# Patient Record
Sex: Female | Born: 1965 | Race: White | Hispanic: No | Marital: Single | Smoking: Former smoker
Health system: Southern US, Community
[De-identification: ages and names within clinical notes are randomized; demographics above are authoritative.]

## PROBLEM LIST (undated history)

## (undated) DIAGNOSIS — N943 Premenstrual tension syndrome: Secondary | ICD-10-CM

## (undated) DIAGNOSIS — B3731 Acute candidiasis of vulva and vagina: Secondary | ICD-10-CM

## (undated) DIAGNOSIS — B373 Candidiasis of vulva and vagina: Secondary | ICD-10-CM

## (undated) DIAGNOSIS — IMO0001 Reserved for inherently not codable concepts without codable children: Secondary | ICD-10-CM

## (undated) DIAGNOSIS — E7800 Pure hypercholesterolemia, unspecified: Secondary | ICD-10-CM

## (undated) DIAGNOSIS — R51 Headache: Secondary | ICD-10-CM

## (undated) DIAGNOSIS — I499 Cardiac arrhythmia, unspecified: Secondary | ICD-10-CM

## (undated) DIAGNOSIS — F319 Bipolar disorder, unspecified: Secondary | ICD-10-CM

## (undated) DIAGNOSIS — N951 Menopausal and female climacteric states: Secondary | ICD-10-CM

## (undated) DIAGNOSIS — F79 Unspecified intellectual disabilities: Secondary | ICD-10-CM

## (undated) DIAGNOSIS — N858 Other specified noninflammatory disorders of uterus: Secondary | ICD-10-CM

## (undated) DIAGNOSIS — F419 Anxiety disorder, unspecified: Secondary | ICD-10-CM

## (undated) DIAGNOSIS — R87619 Unspecified abnormal cytological findings in specimens from cervix uteri: Secondary | ICD-10-CM

## (undated) DIAGNOSIS — Z8639 Personal history of other endocrine, nutritional and metabolic disease: Secondary | ICD-10-CM

## (undated) DIAGNOSIS — N920 Excessive and frequent menstruation with regular cycle: Secondary | ICD-10-CM

## (undated) DIAGNOSIS — G2581 Restless legs syndrome: Secondary | ICD-10-CM

## (undated) DIAGNOSIS — R300 Dysuria: Secondary | ICD-10-CM

## (undated) DIAGNOSIS — N946 Dysmenorrhea, unspecified: Secondary | ICD-10-CM

## (undated) DIAGNOSIS — Z87828 Personal history of other (healed) physical injury and trauma: Secondary | ICD-10-CM

## (undated) DIAGNOSIS — R569 Unspecified convulsions: Secondary | ICD-10-CM

## (undated) DIAGNOSIS — B009 Herpesviral infection, unspecified: Secondary | ICD-10-CM

## (undated) DIAGNOSIS — IMO0002 Reserved for concepts with insufficient information to code with codable children: Secondary | ICD-10-CM

## (undated) DIAGNOSIS — N838 Other noninflammatory disorders of ovary, fallopian tube and broad ligament: Secondary | ICD-10-CM

## (undated) DIAGNOSIS — F70 Mild intellectual disabilities: Secondary | ICD-10-CM

## (undated) DIAGNOSIS — Z8742 Personal history of other diseases of the female genital tract: Secondary | ICD-10-CM

## (undated) DIAGNOSIS — Z8669 Personal history of other diseases of the nervous system and sense organs: Secondary | ICD-10-CM

## (undated) HISTORY — PX: DILATION AND CURETTAGE OF UTERUS: SHX78

## (undated) HISTORY — DX: Pure hypercholesterolemia, unspecified: E78.00

## (undated) HISTORY — DX: Dysmenorrhea, unspecified: N94.6

## (undated) HISTORY — DX: Personal history of other endocrine, nutritional and metabolic disease: Z86.39

## (undated) HISTORY — DX: Acute candidiasis of vulva and vagina: B37.31

## (undated) HISTORY — DX: Herpesviral infection, unspecified: B00.9

## (undated) HISTORY — DX: Unspecified convulsions: R56.9

## (undated) HISTORY — DX: Mild intellectual disabilities: F70

## (undated) HISTORY — DX: Headache: R51

## (undated) HISTORY — DX: Bipolar disorder, unspecified: F31.9

## (undated) HISTORY — DX: Personal history of other diseases of the nervous system and sense organs: Z86.69

## (undated) HISTORY — DX: Premenstrual tension syndrome: N94.3

## (undated) HISTORY — PX: WISDOM TOOTH EXTRACTION: SHX21

## (undated) HISTORY — DX: Restless legs syndrome: G25.81

## (undated) HISTORY — DX: Anxiety disorder, unspecified: F41.9

## (undated) HISTORY — PX: HYSTEROSCOPY W/ ENDOMETRIAL ABLATION: SUR665

## (undated) HISTORY — DX: Unspecified abnormal cytological findings in specimens from cervix uteri: R87.619

## (undated) HISTORY — DX: Dysuria: R30.0

## (undated) HISTORY — DX: Reserved for inherently not codable concepts without codable children: IMO0001

## (undated) HISTORY — DX: Other specified noninflammatory disorders of uterus: N85.8

## (undated) HISTORY — PX: ENDOMETRIAL BIOPSY: SHX622

## (undated) HISTORY — DX: Reserved for concepts with insufficient information to code with codable children: IMO0002

## (undated) HISTORY — DX: Other noninflammatory disorders of ovary, fallopian tube and broad ligament: N83.8

## (undated) HISTORY — DX: Personal history of other diseases of the female genital tract: Z87.42

## (undated) HISTORY — PX: COLPOSCOPY: SHX161

## (undated) HISTORY — PX: POLYPECTOMY: SHX149

## (undated) HISTORY — DX: Candidiasis of vulva and vagina: B37.3

## (undated) HISTORY — DX: Menopausal and female climacteric states: N95.1

## (undated) HISTORY — DX: Excessive and frequent menstruation with regular cycle: N92.0

---

## 1998-12-26 ENCOUNTER — Observation Stay (HOSPITAL_COMMUNITY): Admission: EM | Admit: 1998-12-26 | Discharge: 1998-12-27 | Payer: Self-pay | Admitting: Emergency Medicine

## 1999-01-13 ENCOUNTER — Other Ambulatory Visit: Admission: RE | Admit: 1999-01-13 | Discharge: 1999-01-13 | Payer: Self-pay | Admitting: *Deleted

## 1999-03-19 ENCOUNTER — Ambulatory Visit (HOSPITAL_COMMUNITY): Admission: RE | Admit: 1999-03-19 | Discharge: 1999-03-19 | Payer: Self-pay | Admitting: *Deleted

## 1999-11-04 ENCOUNTER — Other Ambulatory Visit: Admission: RE | Admit: 1999-11-04 | Discharge: 1999-11-04 | Payer: Self-pay | Admitting: *Deleted

## 2001-06-13 ENCOUNTER — Emergency Department (HOSPITAL_COMMUNITY): Admission: EM | Admit: 2001-06-13 | Discharge: 2001-06-13 | Payer: Self-pay | Admitting: Emergency Medicine

## 2001-09-05 ENCOUNTER — Inpatient Hospital Stay (HOSPITAL_COMMUNITY): Admission: AD | Admit: 2001-09-05 | Discharge: 2001-09-05 | Payer: Self-pay | Admitting: Obstetrics

## 2001-09-20 ENCOUNTER — Inpatient Hospital Stay (HOSPITAL_COMMUNITY): Admission: AD | Admit: 2001-09-20 | Discharge: 2001-09-20 | Payer: Self-pay | Admitting: *Deleted

## 2001-09-21 ENCOUNTER — Inpatient Hospital Stay (HOSPITAL_COMMUNITY): Admission: AD | Admit: 2001-09-21 | Discharge: 2001-09-21 | Payer: Self-pay | Admitting: *Deleted

## 2002-09-12 ENCOUNTER — Other Ambulatory Visit: Admission: RE | Admit: 2002-09-12 | Discharge: 2002-09-12 | Payer: Self-pay | Admitting: Obstetrics and Gynecology

## 2005-10-19 DIAGNOSIS — B373 Candidiasis of vulva and vagina: Secondary | ICD-10-CM

## 2005-10-19 DIAGNOSIS — B3731 Acute candidiasis of vulva and vagina: Secondary | ICD-10-CM

## 2005-10-19 HISTORY — DX: Candidiasis of vulva and vagina: B37.3

## 2005-10-19 HISTORY — DX: Acute candidiasis of vulva and vagina: B37.31

## 2006-07-19 ENCOUNTER — Emergency Department (HOSPITAL_COMMUNITY): Admission: EM | Admit: 2006-07-19 | Discharge: 2006-07-19 | Payer: Self-pay | Admitting: Family Medicine

## 2006-08-03 ENCOUNTER — Other Ambulatory Visit: Admission: RE | Admit: 2006-08-03 | Discharge: 2006-08-03 | Payer: Self-pay | Admitting: Obstetrics and Gynecology

## 2006-09-27 ENCOUNTER — Emergency Department (HOSPITAL_COMMUNITY): Admission: EM | Admit: 2006-09-27 | Discharge: 2006-09-27 | Payer: Self-pay | Admitting: Emergency Medicine

## 2006-11-16 ENCOUNTER — Emergency Department (HOSPITAL_COMMUNITY): Admission: EM | Admit: 2006-11-16 | Discharge: 2006-11-16 | Payer: Self-pay | Admitting: Family Medicine

## 2007-04-19 ENCOUNTER — Encounter: Admission: RE | Admit: 2007-04-19 | Discharge: 2007-04-19 | Payer: Self-pay | Admitting: Family Medicine

## 2008-08-13 ENCOUNTER — Emergency Department (HOSPITAL_COMMUNITY): Admission: EM | Admit: 2008-08-13 | Discharge: 2008-08-13 | Payer: Self-pay | Admitting: Emergency Medicine

## 2010-01-30 ENCOUNTER — Ambulatory Visit (HOSPITAL_COMMUNITY): Admission: RE | Admit: 2010-01-30 | Discharge: 2010-01-30 | Payer: Self-pay | Admitting: Obstetrics and Gynecology

## 2010-07-21 ENCOUNTER — Ambulatory Visit (HOSPITAL_COMMUNITY): Admission: RE | Admit: 2010-07-21 | Discharge: 2010-07-21 | Payer: Self-pay | Admitting: Obstetrics and Gynecology

## 2010-12-16 ENCOUNTER — Other Ambulatory Visit: Payer: Self-pay | Admitting: Obstetrics and Gynecology

## 2010-12-16 DIAGNOSIS — Z1231 Encounter for screening mammogram for malignant neoplasm of breast: Secondary | ICD-10-CM

## 2010-12-31 LAB — HCG, QUANTITATIVE, PREGNANCY: hCG, Beta Chain, Quant, S: <2 m[IU]/mL (ref <5)

## 2011-01-01 LAB — CBC
HCT: 37.3 % (ref 36.0–46.0)
Hemoglobin: 12.8 g/dL (ref 12.0–15.0)
MCH: 31.3 pg (ref 26.0–34.0)
MCHC: 34.4 g/dL (ref 30.0–36.0)
MCV: 91.1 fL (ref 78.0–100.0)
Platelets: 294 10*3/uL (ref 150–400)
RBC: 4.09 MIL/uL (ref 3.87–5.11)
RDW: 13 % (ref 11.5–15.5)
WBC: 5.5 10*3/uL (ref 4.0–10.5)

## 2011-01-02 ENCOUNTER — Ambulatory Visit: Payer: Self-pay

## 2011-01-07 LAB — CBC
HCT: 37.6 % (ref 36.0–46.0)
Hemoglobin: 12.9 g/dL (ref 12.0–15.0)
MCHC: 34.4 g/dL (ref 30.0–36.0)
MCV: 91.4 fL (ref 78.0–100.0)
Platelets: 317 10*3/uL (ref 150–400)
RBC: 4.11 MIL/uL (ref 3.87–5.11)
RDW: 13.2 % (ref 11.5–15.5)
WBC: 5.8 10*3/uL (ref 4.0–10.5)

## 2011-01-14 ENCOUNTER — Ambulatory Visit: Payer: Self-pay

## 2011-02-04 ENCOUNTER — Ambulatory Visit
Admission: RE | Admit: 2011-02-04 | Discharge: 2011-02-04 | Disposition: A | Discharge status: Home / Self Care | Payer: Medicare Other | Source: Ambulatory Visit | Attending: Obstetrics and Gynecology | Admitting: Obstetrics and Gynecology

## 2011-02-04 DIAGNOSIS — Z1231 Encounter for screening mammogram for malignant neoplasm of breast: Secondary | ICD-10-CM

## 2011-05-01 DIAGNOSIS — Z5181 Encounter for therapeutic drug level monitoring: Secondary | ICD-10-CM | POA: Insufficient documentation

## 2011-07-20 LAB — URINE MICROSCOPIC-ADD ON

## 2011-07-20 LAB — DIFFERENTIAL
Lymphocytes Relative: 23
Lymphs Abs: 2
Monocytes Absolute: 0.5
Monocytes Relative: 6
Neutro Abs: 5.9

## 2011-07-20 LAB — RAPID URINE DRUG SCREEN, HOSP PERFORMED
Amphetamines: NOT DETECTED
Barbiturates: POSITIVE — AB

## 2011-07-20 LAB — COMPREHENSIVE METABOLIC PANEL
AST: 19
CO2: 28
Calcium: 9.2
Creatinine, Ser: 0.55
GFR calc Af Amer: >60
GFR calc non Af Amer: >60
Glucose, Bld: 87

## 2011-07-20 LAB — CBC
Hemoglobin: 13
RBC: 4.17

## 2011-07-20 LAB — URINALYSIS, ROUTINE W REFLEX MICROSCOPIC
Glucose, UA: NEGATIVE
Specific Gravity, Urine: 1.011
Urobilinogen, UA: 0.2

## 2011-07-20 LAB — POCT PREGNANCY, URINE: Preg Test, Ur: NEGATIVE

## 2011-10-20 HISTORY — PX: ABDOMINAL HYSTERECTOMY: SHX81

## 2012-02-16 ENCOUNTER — Other Ambulatory Visit: Payer: Self-pay | Admitting: Family Medicine

## 2012-02-16 DIAGNOSIS — Z1231 Encounter for screening mammogram for malignant neoplasm of breast: Secondary | ICD-10-CM

## 2012-02-26 ENCOUNTER — Ambulatory Visit
Admission: RE | Admit: 2012-02-26 | Discharge: 2012-02-26 | Disposition: A | Discharge status: Home / Self Care | Payer: Medicare Other | Source: Ambulatory Visit | Attending: Family Medicine | Admitting: Family Medicine

## 2012-02-26 DIAGNOSIS — Z1231 Encounter for screening mammogram for malignant neoplasm of breast: Secondary | ICD-10-CM

## 2012-03-22 ENCOUNTER — Encounter: Payer: Self-pay | Admitting: Obstetrics and Gynecology

## 2012-04-20 ENCOUNTER — Encounter: Payer: Self-pay | Admitting: Obstetrics and Gynecology

## 2012-04-20 DIAGNOSIS — F319 Bipolar disorder, unspecified: Secondary | ICD-10-CM | POA: Insufficient documentation

## 2012-04-20 DIAGNOSIS — Z8669 Personal history of other diseases of the nervous system and sense organs: Secondary | ICD-10-CM | POA: Insufficient documentation

## 2012-04-20 DIAGNOSIS — Z8639 Personal history of other endocrine, nutritional and metabolic disease: Secondary | ICD-10-CM | POA: Insufficient documentation

## 2012-04-20 DIAGNOSIS — B379 Candidiasis, unspecified: Secondary | ICD-10-CM

## 2012-04-20 DIAGNOSIS — IMO0001 Reserved for inherently not codable concepts without codable children: Secondary | ICD-10-CM

## 2012-04-20 DIAGNOSIS — G40909 Epilepsy, unspecified, not intractable, without status epilepticus: Secondary | ICD-10-CM | POA: Insufficient documentation

## 2012-04-20 DIAGNOSIS — Z8742 Personal history of other diseases of the female genital tract: Secondary | ICD-10-CM | POA: Insufficient documentation

## 2012-04-20 DIAGNOSIS — L819 Disorder of pigmentation, unspecified: Secondary | ICD-10-CM | POA: Insufficient documentation

## 2012-04-20 DIAGNOSIS — B009 Herpesviral infection, unspecified: Secondary | ICD-10-CM

## 2012-04-20 DIAGNOSIS — N943 Premenstrual tension syndrome: Secondary | ICD-10-CM

## 2012-04-22 ENCOUNTER — Encounter: Payer: Self-pay | Admitting: Obstetrics and Gynecology

## 2012-04-25 ENCOUNTER — Encounter: Payer: Self-pay | Admitting: Obstetrics and Gynecology

## 2012-06-10 ENCOUNTER — Telehealth: Payer: Self-pay

## 2012-06-10 NOTE — Telephone Encounter (Signed)
Lm on vm tcb rgd msg 

## 2012-07-20 ENCOUNTER — Encounter: Payer: Self-pay | Admitting: Obstetrics and Gynecology

## 2012-08-22 ENCOUNTER — Ambulatory Visit: Payer: Self-pay | Admitting: Obstetrics and Gynecology

## 2012-08-22 ENCOUNTER — Ambulatory Visit (INDEPENDENT_AMBULATORY_CARE_PROVIDER_SITE_OTHER): Payer: Medicare Other | Admitting: Obstetrics and Gynecology

## 2012-08-22 ENCOUNTER — Encounter: Payer: Self-pay | Admitting: Obstetrics and Gynecology

## 2012-08-22 VITALS — BP 116/74 | Ht 65.0 in | Wt 200.0 lb

## 2012-08-22 DIAGNOSIS — Z124 Encounter for screening for malignant neoplasm of cervix: Secondary | ICD-10-CM

## 2012-08-22 NOTE — Patient Instructions (Signed)

## 2012-08-22 NOTE — Progress Notes (Signed)
Last Pap: 2012 WNL: ascus Regular Periods:yes Contraception: n/a  Monthly Breast exam:yes Tetanus<67yrs:yes Nl.Bladder Function:yes Daily BMs:yes Healthy Diet:yes Calcium:no Mammogram:yes Date of Mammogram: 2013 dense breast need repeat Exercise:no Have often Exercise: n/a Seatbelt: yes Abuse at home: no Stressful work:no Sigmoid-colonoscopy: n/a Bone Density: No PCP: Dr. Clarene Duke Change in PMH: no change Change in FMH:no change BP 116/74  Ht 5\' 5"  (1.651 m)  Wt 200 lb (90.719 kg)  BMI 33.28 kg/m2 Pt with complaints:yes  She has had occ rapid heart beat and nausea Physical Examination: General appearance - alert, well appearing, and in no distress Mental status - normal mood, behavior, speech, dress, motor activity, and thought processes Neck - supple, no significant adenopathy,  thyroid exam: thyroid is normal in size without nodules or tenderness Chest - clear to auscultation, no wheezes, rales or rhonchi, symmetric air entry Heart - normal rate and regular rhythm Abdomen - soft, nontender, nondistended, no masses or organomegaly Breasts - breasts appear normal, no suspicious masses, no skin or nipple changes or axillary nodes Pelvic - normal external genitalia, vulva, vagina, cervix, uterus and adnexa Rectal - rectal exam not indicated Back exam - full range of motion, no tenderness, palpable spasm or pain on motion Neurological - alert, oriented, normal speech, no focal findings or movement disorder noted Musculoskeletal - no joint tenderness, deformity or swelling Extremities - no edema, redness or tenderness in the calves or thighs Skin - normal coloration and turgor, no rashes, no suspicious skin lesions noted Routine exam Pap sent yes Mammogram due no abstinance used for contraception RT 1 yr Call pcp about nausea and heart palpitaitons

## 2012-08-23 LAB — PAP IG W/ RFLX HPV ASCU

## 2012-08-29 ENCOUNTER — Telehealth: Payer: Self-pay

## 2012-08-29 NOTE — Telephone Encounter (Signed)
Spoke with pt informed pap results informed repeat in 1 yr pt voice understanding

## 2012-08-29 NOTE — Telephone Encounter (Signed)
Message copied by Rolla Plate on Mon Aug 29, 2012 10:53 AM ------      Message from: Jaymes Graff      Created: Mon Aug 29, 2012 10:05 AM       Please tell patient her pap results and that she can repeat her pap in one year.  Thank you

## 2012-10-06 ENCOUNTER — Emergency Department (HOSPITAL_COMMUNITY): Payer: Medicare Other

## 2012-10-06 ENCOUNTER — Encounter (HOSPITAL_COMMUNITY): Payer: Self-pay | Admitting: Emergency Medicine

## 2012-10-06 ENCOUNTER — Emergency Department (HOSPITAL_COMMUNITY)
Admission: EM | Admit: 2012-10-06 | Discharge: 2012-10-06 | Disposition: A | Discharge status: Home / Self Care | Payer: Medicare Other | Attending: Emergency Medicine | Admitting: Emergency Medicine

## 2012-10-06 DIAGNOSIS — F319 Bipolar disorder, unspecified: Secondary | ICD-10-CM | POA: Insufficient documentation

## 2012-10-06 DIAGNOSIS — Z79899 Other long term (current) drug therapy: Secondary | ICD-10-CM | POA: Insufficient documentation

## 2012-10-06 DIAGNOSIS — Z8719 Personal history of other diseases of the digestive system: Secondary | ICD-10-CM | POA: Insufficient documentation

## 2012-10-06 DIAGNOSIS — F411 Generalized anxiety disorder: Secondary | ICD-10-CM | POA: Insufficient documentation

## 2012-10-06 DIAGNOSIS — S82409A Unspecified fracture of shaft of unspecified fibula, initial encounter for closed fracture: Secondary | ICD-10-CM | POA: Insufficient documentation

## 2012-10-06 DIAGNOSIS — Y9389 Activity, other specified: Secondary | ICD-10-CM | POA: Insufficient documentation

## 2012-10-06 DIAGNOSIS — S82839A Other fracture of upper and lower end of unspecified fibula, initial encounter for closed fracture: Secondary | ICD-10-CM

## 2012-10-06 DIAGNOSIS — Y929 Unspecified place or not applicable: Secondary | ICD-10-CM | POA: Insufficient documentation

## 2012-10-06 DIAGNOSIS — X500XXA Overexertion from strenuous movement or load, initial encounter: Secondary | ICD-10-CM | POA: Insufficient documentation

## 2012-10-06 DIAGNOSIS — Z8669 Personal history of other diseases of the nervous system and sense organs: Secondary | ICD-10-CM | POA: Insufficient documentation

## 2012-10-06 DIAGNOSIS — E7800 Pure hypercholesterolemia, unspecified: Secondary | ICD-10-CM | POA: Insufficient documentation

## 2012-10-06 DIAGNOSIS — Z8742 Personal history of other diseases of the female genital tract: Secondary | ICD-10-CM | POA: Insufficient documentation

## 2012-10-06 NOTE — ED Notes (Signed)
Paged ortho for cam walker; states that they are on their way.

## 2012-10-06 NOTE — ED Notes (Signed)
Patient given copy of discharge paperwork; went over discharge instructions with patient.  Patient instructed to follow up with [facility] orthopedics (call first thing in the morning), to keep cam walker in place until instructed further by orthopedist, and to return to the ED for new, worsening, or concerning symptoms.  Ortho tech at bedside applying cam walker at this time.

## 2012-10-06 NOTE — ED Notes (Signed)
Patient complaining of left ankle pain; patient reports that she was taking out the trash earlier this afternoon when she slipped, twisted her left ankle, and fell down.  Patient reports that she heard a "pop" upon twisting her ankle.  Patient able to wiggle toes; sensation intact.  Ice pack placed on left ankle; Dr. Jeraldine Loots currently at bedside.  Patient alert and oriented x4; PERRL present.  Will continue to monitor.

## 2012-10-06 NOTE — ED Provider Notes (Signed)
History  Scribed for Gerhard Munch, MD, the patient was seen in room TR05C/TR05C. This chart was scribed by Candelaria Stagers. The patient's care started at 5:56 PM   CSN: [id number]  Arrival date & time 10/06/12  1742   First MD Initiated Contact with Patient 10/06/12 1753      Chief Complaint  Patient presents with  . Ankle Pain     The history is provided by the patient. No language interpreter was used.   Teresa Lawrence is a 46 y.o. female who presents to the Emergency Department complaining of left ankle pain after slipping and twisting her ankle earlier today.  Pt reports she heard a pop when she twisted the ankle.  Pt has taken nothing for pain.  Pt has h/o bipolar.  She has not been ambulatory since the fall.  Pain is worse with motion. Past Medical History  Diagnosis Date  . Vaginal yeast infection 2007    recurrent   . PMS (premenstrual syndrome)   . Dysmenorrhea   . Yeast vaginitis   . Dysuria   . High blood cholesterol   . Perimenopausal vasomotor symptoms   . HSV-2 infection   . Anxiety   . Bipolar disorder   . Menorrhagia   . Mass of ovary     left side  . Uterine mass   . Bipolar disorder   . Hx of ovarian cyst   . Hx of seizure disorder   . Dysplasia   . Abnormal Pap smear   . Hx of hypercholesterolemia   . PMS (premenstrual syndrome)   . PMS (premenstrual syndrome)     Past Surgical History  Procedure Date  . Hysteroscopy w/ endometrial ablation   . Dilation and curettage of uterus   . Polypectomy   . Colposcopy   . Endometrial biopsy   . Wisdom tooth extraction     History reviewed. No pertinent family history.  History  Substance Use Topics  . Smoking status: Never Smoker   . Smokeless tobacco: Not on file  . Alcohol Use: Not on file    OB History    Grav Para Term Preterm Abortions TAB SAB Ect Mult Living                  Review of Systems  Musculoskeletal: Positive for arthralgias (left ankle ).  All other systems reviewed  and are negative.    Allergies  Review of patient's allergies indicates no known allergies.  Home Medications   Current Outpatient Rx  Name  Route  Sig  Dispense  Refill  . CLONAZEPAM 2 MG PO TABS   Oral   Take 2 mg by mouth 2 (two) times daily as needed.         Marland Kitchen DOXEPIN HCL 75 MG PO CAPS   Oral   Take 75 mg by mouth.         Marland Kitchen FEXOFENADINE HCL 180 MG PO TABS   Oral   Take 180 mg by mouth daily.         Marland Kitchen PHENOBARBITAL 64.8 MG PO TABS   Oral   Take 64.8 mg by mouth 2 (two) times daily.         . QUETIAPINE FUMARATE 100 MG PO TABS   Oral   Take 100 mg by mouth at bedtime.         Marland Kitchen ROPINIROLE HCL 1 MG PO TABS   Oral   Take 1 mg by mouth 3 (three) times  daily.         Marland Kitchen SIMVASTATIN 40 MG PO TABS   Oral   Take 40 mg by mouth every evening.           BP 132/71  Pulse 90  Temp 97.9 F (36.6 C) (Oral)  Resp 18  SpO2 95%  Physical Exam  Nursing note and vitals reviewed. Constitutional: She is oriented to person, place, and time. She appears well-developed and well-nourished. No distress.  HENT:  Head: Normocephalic and atraumatic.  Eyes: EOM are normal.  Neck: Neck supple. No tracheal deviation present.  Musculoskeletal: Normal range of motion.       Lateral malleolus pain and edema.  Quadriceps tendon intact.    Neurological: She is alert and oriented to person, place, and time.  Skin: Skin is warm and dry.  Psychiatric: She has a normal mood and affect. Her behavior is normal.    ED Course  ORTHOPEDIC INJURY TREATMENT Date/Time: 10/06/2012 7:00 PM Performed by: Gerhard Munch Authorized by: Gerhard Munch Consent: Verbal consent obtained. The procedure was performed in an emergent situation. Risks and benefits: risks, benefits and alternatives were discussed Consent given by: patient and parent Patient identity confirmed: verbally with patient Time out: Immediately prior to procedure a "time out" was called to verify the correct  patient, procedure, equipment, support staff and site/side marked as required. Injury location: ankle Location details: left ankle Injury type: Avulsion fracture. Pre-procedure neurovascular assessment: neurovascularly intact Pre-procedure distal perfusion: normal Pre-procedure neurological function: normal Pre-procedure range of motion: reduced Local anesthesia used: no Patient sedated: no Splint type: short leg Supplies used: Cam Walker. Post-procedure neurovascular assessment: post-procedure neurovascularly intact Post-procedure distal perfusion: normal Post-procedure neurological function: normal Post-procedure range of motion: unchanged Patient tolerance: Patient tolerated the procedure well with no immediate complications.     DIAGNOSTIC STUDIES: Oxygen Saturation is 95% on room air, normal by my interpretation.    COORDINATION OF CARE:  17:55 Ordered: DG Ankle Complete Left   6:49 PM Recheck: Discussed images with pt and the need for a walking boot.  Pt understands and agrees.    Labs Reviewed - No data to display Dg Ankle Complete Left  10/06/2012  *RADIOLOGY REPORT*  Clinical Data: Fall, lateral ankle pain  LEFT ANKLE COMPLETE - 3+ VIEW  Comparison: None.  Findings: No displaced fracture, malalignment or ankle joint effusion.  There is diffuse soft tissue swelling about the lateral malleolus.  A thin linear density just inferior to the distal fibula may represent a tiny avulsion fracture.  Ankle mortise remains congruent.  Bony mineralization within normal limits.  IMPRESSION:  Probable tiny avulsion fracture at the distal tip of the fibula with associated surrounding soft tissue swelling.   Original Report Authenticated By: Malachy Moan, M.D.      No diagnosis found.   I interpreted the x-ray - distal fibula avulsion MDM   I personally performed the services described in this documentation, which was scribed in my presence. The recorded information has been  reviewed and is accurate.   This patient presents immediately after fall, with resultant pain in the left ankle.  Patient has no other focal complaints.  She is distally neurovascularly intact.  The patient's evaluation demonstrates fracture, avulsion of the distal fibula.  The patient was placed in a cam walker, stable for discharge.    Gerhard Munch, MD 10/06/12 1901

## 2012-10-06 NOTE — ED Notes (Signed)
Dr.Lockwood at bedside  

## 2012-10-06 NOTE — ED Notes (Addendum)
Pt c/o left ankle pain after falling and twisting ankle

## 2012-10-06 NOTE — Progress Notes (Signed)
Orthopedic Tech Progress Note Patient Details:  Teresa Lawrence [facility] 1966-01-19 [id number]  Ortho Devices Type of Ortho Device: CAM walker Ortho Device/Splint Location: (L)LE Ortho Device/Splint Interventions: Application   Jennye Moccasin 10/06/2012, 7:13 PM

## 2012-12-22 ENCOUNTER — Telehealth: Payer: Self-pay | Admitting: Obstetrics and Gynecology

## 2012-12-22 NOTE — Telephone Encounter (Signed)
Pt called, states beginning yesterday has been having a lot of cramping, pt says "something just does not feel right", but is not having any bldg.  It is difficult for her to get up.  The cramping is constant, is taking Pamprin with some relief.  The pain is making her very irritable and her bottom very sore.  Pt scheduled an appt for an eval tomorrow 12/23/12 w/ EP @ 1030.

## 2012-12-23 ENCOUNTER — Encounter: Payer: Medicare Other | Admitting: Obstetrics and Gynecology

## 2013-01-23 ENCOUNTER — Other Ambulatory Visit: Payer: Self-pay

## 2013-01-23 DIAGNOSIS — Z1231 Encounter for screening mammogram for malignant neoplasm of breast: Secondary | ICD-10-CM

## 2013-02-02 ENCOUNTER — Telehealth: Payer: Self-pay | Admitting: *Deleted

## 2013-02-02 NOTE — Telephone Encounter (Signed)
Patient called stating she isn't sleeping well and would like a prescription for  Xanax called into her pharmacy.

## 2013-02-02 NOTE — Telephone Encounter (Signed)
I called patient. The patient wants a prescription for Xanax, but she is already on clonazepam. The patient also has not been seen through our office in over 2 years. The patient will need a revisit.

## 2013-02-23 ENCOUNTER — Telehealth: Payer: Self-pay | Admitting: *Deleted

## 2013-02-23 ENCOUNTER — Encounter: Payer: Self-pay | Admitting: Nurse Practitioner

## 2013-02-23 ENCOUNTER — Encounter: Payer: Medicare Other | Admitting: Nurse Practitioner

## 2013-02-23 VITALS — BP 114/72 | HR 83 | Ht 63.5 in | Wt 196.0 lb

## 2013-02-23 NOTE — Progress Notes (Signed)
This encounter was created in error - please disregard.  This encounter was created in error - please disregard.

## 2013-02-23 NOTE — Telephone Encounter (Signed)
I called and spoke with the patient concerning her being upset that she couldn't see Dr. Anne Hahn. Patient stated she was told two weeks ago that she could see Dr. Anne Hahn at 9:30 and she was upset and left the exam room because she wasn't seeing Dr. Anne Hahn. I informed the patient that she did receive a reminder call on 02-22-13 to CM, patient stated she wasn't told she was seeing CM. Patient apologized for leaving the exam room.

## 2013-02-23 NOTE — Progress Notes (Signed)
This encounter was created in error - please disregard.

## 2013-02-25 ENCOUNTER — Other Ambulatory Visit: Payer: Self-pay | Admitting: Neurology

## 2013-02-27 ENCOUNTER — Telehealth: Payer: Self-pay | Admitting: *Deleted

## 2013-02-28 ENCOUNTER — Other Ambulatory Visit: Payer: Self-pay

## 2013-02-28 ENCOUNTER — Ambulatory Visit
Admission: RE | Admit: 2013-02-28 | Discharge: 2013-02-28 | Disposition: A | Discharge status: Home / Self Care | Payer: Medicare Other | Source: Ambulatory Visit

## 2013-02-28 DIAGNOSIS — Z1231 Encounter for screening mammogram for malignant neoplasm of breast: Secondary | ICD-10-CM

## 2013-02-28 MED ORDER — PHENOBARBITAL 64.8 MG PO TABS: 64.8000 mg | ORAL_TABLET | Freq: Two times a day (BID) | ORAL | Status: DC

## 2013-02-28 NOTE — Telephone Encounter (Signed)
I called and spoke with the patient. Patient is scheuduled 03-17-13@12  with Dr. Anne Hahn

## 2013-02-28 NOTE — Telephone Encounter (Signed)
Patient has appt scheduled 05/30.  She needs one refill to last until appt.  She has not been seen since 2012 and came to her last appt scheduled, but walked out before she could be seen. She said she was sorry, she was in a bad mood that day.

## 2013-03-17 ENCOUNTER — Encounter: Payer: Self-pay | Admitting: *Deleted

## 2013-03-17 ENCOUNTER — Encounter: Payer: Self-pay | Admitting: Neurology

## 2013-03-17 ENCOUNTER — Ambulatory Visit (INDEPENDENT_AMBULATORY_CARE_PROVIDER_SITE_OTHER): Payer: Medicare Other | Admitting: Neurology

## 2013-03-17 VITALS — BP 130/90 | HR 83 | Ht 64.0 in | Wt 198.0 lb

## 2013-03-17 DIAGNOSIS — G4700 Insomnia, unspecified: Secondary | ICD-10-CM

## 2013-03-17 DIAGNOSIS — F319 Bipolar disorder, unspecified: Secondary | ICD-10-CM

## 2013-03-17 DIAGNOSIS — Z5181 Encounter for therapeutic drug level monitoring: Secondary | ICD-10-CM

## 2013-03-17 DIAGNOSIS — R51 Headache: Secondary | ICD-10-CM

## 2013-03-17 DIAGNOSIS — Z8669 Personal history of other diseases of the nervous system and sense organs: Secondary | ICD-10-CM

## 2013-03-17 MED ORDER — QUETIAPINE FUMARATE 100 MG PO TABS: 150.0000 mg | ORAL_TABLET | Freq: Every day | ORAL | Status: DC

## 2013-03-17 MED ORDER — PRAMIPEXOLE DIHYDROCHLORIDE 0.25 MG PO TABS: 0.2500 mg | ORAL_TABLET | Freq: Three times a day (TID) | ORAL | Status: DC

## 2013-03-17 NOTE — Progress Notes (Signed)
Reason for visit: Headache  Teresa Lawrence is an 47 y.o. female  History of present illness:  Teresa Lawrence is a 47 year old right-handed white female with a history of a bipolar disorder, mild mental retardation, well controlled seizure disorder, migraine headache, and a panic disorder. The patient indicates that she continues to have chronic insomnia, and she is having some problems with anxiety episodes at night and during the day. The patient is having panic attacks almost daily. The patient indicates that she is now having increasing problems with achy sensations in the legs when she sits and rests. The legs do not bother her when she is up doing things. In the past, Mirapex seemed to help in low dose taking 0.125 mg 3 times daily. The effects of this medication have since worn off. The patient indicates that her mood swings are getting worse. The patient does not see a psychiatrist for this. The patient takes Seroquel at night, which helps her mood disorder to some degree. The patient also reports relatively frequent "ice pick pains" throughout her head. The patient returns for an evaluation. The patient has not had any further seizures since being on phenobarbital.  Past Medical History  Diagnosis Date  . Vaginal yeast infection 2007    recurrent   . PMS (premenstrual syndrome)   . Dysmenorrhea   . Yeast vaginitis   . Dysuria   . High blood cholesterol   . Perimenopausal vasomotor symptoms   . HSV-2 infection   . Anxiety   . Bipolar disorder   . Menorrhagia   . Mass of ovary     left side  . Uterine mass   . Hx of ovarian cyst   . Hx of seizure disorder   . Dysplasia   . Abnormal Pap smear   . Hx of hypercholesterolemia   . PMS (premenstrual syndrome)   . PMS (premenstrual syndrome)   . Headache(784.0)   . RLS (restless legs syndrome)   . Mild mental retardation   . Seizures     Past Surgical History  Procedure Laterality Date  . Hysteroscopy w/ endometrial ablation     . Dilation and curettage of uterus    . Polypectomy    . Colposcopy    . Endometrial biopsy    . Wisdom tooth extraction      Family History  Problem Relation Age of Onset  . Heart attack Father     Social history:  reports that she has quit smoking. She has never used smokeless tobacco. She reports that she does not drink alcohol or use illicit drugs.  Allergies:  Allergies  Allergen Reactions  . Requip (Ropinirole Hcl) Nausea And Vomiting    Medications:  Current Outpatient Prescriptions on File Prior to Visit  Medication Sig Dispense Refill  . acyclovir (ZOVIRAX) 200 MG capsule Take 200 mg by mouth 3 (three) times daily as needed.      . clonazePAM (KLONOPIN) 2 MG tablet Take 2-4 mg by mouth 2 (two) times daily. Takes 2mg  in the am & 4mg  in the pm      . naproxen sodium (ANAPROX) 220 MG tablet Take 440 mg by mouth at bedtime as needed. For leg pain      . omeprazole (PRILOSEC) 20 MG capsule Take 20 mg by mouth daily.      Marland Kitchen PHENobarbital (LUMINAL) 64.8 MG tablet Take 1 tablet (64.8 mg total) by mouth 2 (two) times daily.  60 tablet  0  . rosuvastatin (CRESTOR)  40 MG tablet Take 40 mg by mouth every morning.       No current facility-administered medications on file prior to visit.    ROS:  Out of a complete 14 system review of symptoms, the patient complains only of the following symptoms, and all other reviewed systems are negative.  Weight gain, fatigue Chest pain Blurred vision Feeling hot, increased thirst Joint pain, achy muscles Allergies, and runny nose Confusion, headache, numbness, dizziness Depression, anxiety, panic attacks, insomnia, restless legs  Blood pressure 130/90, pulse 83, height 5\' 4"  (1.626 m), weight 198 lb (89.812 kg).  Physical Exam  General: The patient is alert and cooperative at the time of the examination.  Skin: No significant peripheral edema is noted.   Neurologic Exam  Cranial nerves: Facial symmetry is present. Speech is  normal, no aphasia or dysarthria is noted. Extraocular movements are full. Visual fields are full.  Motor: The patient has good strength in all 4 extremities.  Coordination: The patient has good finger-nose-finger and heel-to-shin bilaterally.  Gait and station: The patient has a normal gait. Tandem gait is normal. Romberg is negative. No drift is seen.  Reflexes: Deep tendon reflexes are symmetric.   Assessment/Plan:  1. Migraine headache  2. Seizure disorder, under good control  3. Bipolar disorder  4. Panic disorder  5. Mild mental retardation  6. Restless leg syndrome  7. Chronic insomnia  The patient will be increased on the Seroquel to help her migraine and to help her insomnia. The patient will go up on the Mirapex to help her restless leg syndrome taking 0.25 mg 3 times daily. The patient will followup through this office in about 6 months. We will check blood work today. The blood work will include a ferritin level.  Marlan Palau MD 03/18/2013 7:40 PM  [facility] [address] [place], [place] [address]  Phone [phone] Fax [phone]

## 2013-03-21 ENCOUNTER — Telehealth: Payer: Self-pay | Admitting: Neurology

## 2013-03-21 LAB — CBC WITH DIFFERENTIAL
Basophils Absolute: 0 10*3/uL (ref 0.0–0.2)
Eosinophils Absolute: 0.2 10*3/uL (ref 0.0–0.4)
HCT: 37.5 % (ref 34.0–46.6)
Immature Granulocytes: 0 % (ref 0–2)
Lymphocytes Absolute: 2.1 10*3/uL (ref 0.7–3.1)
Lymphs: 25 % (ref 14–46)
MCHC: 33.3 g/dL (ref 31.5–35.7)
Neutrophils Absolute: 5.5 10*3/uL (ref 1.4–7.0)
Platelets: 316 10*3/uL (ref 155–379)
RDW: 12.6 % (ref 12.3–15.4)

## 2013-03-21 LAB — COMPREHENSIVE METABOLIC PANEL
ALT: 20 IU/L (ref 0–32)
Albumin/Globulin Ratio: 1.4 (ref 1.1–2.5)
Alkaline Phosphatase: 77 IU/L (ref 39–117)
BUN/Creatinine Ratio: 14 (ref 9–23)
Chloride: 98 mmol/L (ref 97–108)
GFR calc Af Amer: 128 mL/min/{1.73_m2} (ref >59)
GFR calc non Af Amer: 111 mL/min/{1.73_m2} (ref >59)
Glucose: 80 mg/dL (ref 65–99)
Potassium: 3.4 mmol/L — ABNORMAL LOW (ref 3.5–5.2)
Sodium: 138 mmol/L (ref 134–144)
Total Bilirubin: 0.2 mg/dL (ref 0.0–1.2)
Total Protein: 7.1 g/dL (ref 6.0–8.5)

## 2013-03-21 LAB — PHENOBARBITAL LEVEL: Phenobarbital Lvl: 11 ug/mL — ABNORMAL LOW (ref 15–40)

## 2013-03-21 NOTE — Telephone Encounter (Signed)
I called and left a message for the patient that her lab results were normal.

## 2013-03-21 NOTE — Progress Notes (Signed)
Quick Note:  I called and left a message for the patient that her lab results were normal. ______ 

## 2013-04-04 ENCOUNTER — Other Ambulatory Visit: Payer: Self-pay | Admitting: Obstetrics and Gynecology

## 2013-05-01 ENCOUNTER — Other Ambulatory Visit: Payer: Self-pay | Admitting: Neurology

## 2013-05-05 ENCOUNTER — Telehealth: Payer: Self-pay | Admitting: Neurology

## 2013-05-06 NOTE — Telephone Encounter (Signed)
I called the patient and I left a message. If she still having some problems with her "nerves", she will contact our office next week. Ultimately, this patient needs to be followed up through a psychiatrist, as the vast majority of her phone calls to this office are for psychiatric reasons.

## 2013-05-08 ENCOUNTER — Telehealth: Payer: Self-pay | Admitting: Neurology

## 2013-05-08 MED ORDER — ESCITALOPRAM OXALATE 10 MG PO TABS: 10.0000 mg | ORAL_TABLET | Freq: Every day | ORAL | Status: DC

## 2013-05-08 NOTE — Telephone Encounter (Signed)
I called the patient. She is having increased problems with anxiety. I have indicated that she needs to see a psychiatrist, but she does not wish to do this at this time. The patient will be given a prescription for Lexapro. The patient wanted to go on Valium, but she is already on clonazepam.

## 2013-05-08 NOTE — Telephone Encounter (Signed)
I called and spoke with the patient and she stated she feels like she's losing her mind and she needs valium to calm her down. Patient stated she has been feeling this way since last week.

## 2013-05-15 ENCOUNTER — Other Ambulatory Visit (HOSPITAL_COMMUNITY): Payer: Self-pay | Admitting: Obstetrics and Gynecology

## 2013-05-15 NOTE — H&P (Signed)
Teresa Lawrence is a 46 y.o. female G 0,  who is S/P Endometrial Ablation and  presents for hysterectomy because of irregular bleeding and dysmenorrhea.  In the past several months, the patient reports bleeding twice a month with "bad" cramps rated as a 9/10 on a 10 point pain scale.  She does find some relief with Midol, decreasing her pain to 5/10 on a 10 point pain scale.  She admits to increase irritability and insomnia but denies vaginitis symptoms, urinary tract symptoms or changes in bowel function.  An endometrial biopsy showed superficial fragments of atrophic endometrial fragments.  Given that the patient's ablation did not stop her vaginal bleeding and the severity of her menstrual cramping, the patient has decided to proceed with definitive therapy in the form of hysterectomy.  The patient was advised or medications, repeat surgicial  procedures  and possibly enduring her discomfort but she was emphatic about proceeding with the hysterectomy.   Past Medical History  OB History: G0  GYN History: menarche- 47 YO;   LMP: 05/03/13;    Contracepton abstinence  The patient reports a past history of: herpes.  Has a remote history of abnormal PAP smear but have been normal in recent years;  Last PAP smear-08/22/2012  Medical History: Bipolar disorder, anxiety, panic disorder, mild cognitive delay, hypercholesterolemia, insomnia, migraine, and epilepsy.  Surgical History:  2011- Hysteroscopy with D & C, Polypectomy and Endometrial Ablation (Thermachoice) Denies problems with anesthesia or history of blood transfusions  Family History:  Heart disease, asthma, emphysema  Social History:  Single and disabled; Former tobacco smoker, denies alcohol or illicit drug use; Patient is Vegetarian   Medication: Acyclovir 200 mg prn Crestor 40 mg daily Diazepam 5 mg bid Escitalopram 10 mg daily Flovent HFA 2 puffs prn as directed Klonipin 2 mg tid Omeprazole 20 mg daily Phenadoz 25 mg RectalSuppository  prn Phenobarbital 64.8 mg daily Pramipexole 0.25 mg  qhs  Allergies  Allergen Reactions  . Requip (Ropinirole Hcl) Nausea And Vomiting  Promethazine causes nausea and vomiting  Denies sensitivity to peanuts, shellfish, soy, latex or adhesives.   ROS: Admits to reading glasses, occasional nausea, right ankle swelling with menses, headaches with migraine episodes;  Denies  vision changes, nasal congestion, dysphagia, tinnitus, dizziness, hoarseness, cough,  chest pain, shortness of breath, nausea, vomiting, diarrhea,constipation,  urinary frequency, urgency  dysuria, hematuria, vaginitis symptoms, pelvic pain, swelling of joints,easy bruising,  myalgias, arthralgias, skin rashes, unexplained weight loss and except as is mentioned in the history of present illness, patient's review of systems is otherwise negative.  Physical Exam  Bp-102/70,  P-100,  R-14,  Temp.-98.7 degrees F orally,  Weight-202 lbs.,  Height- 5'3"  BMI-35.8  Neck: supple without masses or thyromegaly Lungs: clear to auscultation Heart: regular rate and rhythm Abdomen: soft, non-tender and no organomegaly Pelvic:EGBUS- wnl; vagina-normal rugae; uterus-normal size, cervix without lesions or motion tenderness; adnexae-no tenderness or masses Extremities:  no clubbing, cyanosis or edema   Assesment:  Irregular Bleeding                       Dysmenorrhea   Disposition:  A discussion was held with patient regarding the indication for her procedure(s) along with the risks, which include but are not limited to: reaction to anesthesia, damage to adjacent organs, infection, excessive bleeding and possible need for an open abdominal incision.  The patient was given a Miralax Bowel Prep to be completed the day before her surgery.  She   verbalized understanding of these risks and with her mother present, had all of her questions answered.  She has consented to undergo a Total Laparoscopic Hysterectomy with Bilateral Salpingectomy,  possible Laparoscopically Assisted Vaginal Hysterectomy or possible Total Abdominal Hysterectomy with Cystoscopy at [facility], May 30, 2013 at 1:30 p.m.   CSN# [id number]   Bing Duffey J. Raeden Belzer, PA-C  for Dr. Naima A.Dillard   

## 2013-05-18 ENCOUNTER — Other Ambulatory Visit: Payer: Self-pay | Admitting: Obstetrics and Gynecology

## 2013-05-24 ENCOUNTER — Other Ambulatory Visit (HOSPITAL_COMMUNITY): Payer: Medicare Other

## 2013-05-24 ENCOUNTER — Encounter (HOSPITAL_COMMUNITY): Payer: Self-pay | Admitting: Pharmacist

## 2013-05-25 ENCOUNTER — Encounter (HOSPITAL_COMMUNITY)
Admission: RE | Admit: 2013-05-25 | Discharge: 2013-05-25 | Disposition: A | Discharge status: Home / Self Care | Payer: Medicare Other | Source: Ambulatory Visit | Attending: Obstetrics and Gynecology | Admitting: Obstetrics and Gynecology

## 2013-05-25 ENCOUNTER — Encounter (HOSPITAL_COMMUNITY): Payer: Self-pay

## 2013-05-25 DIAGNOSIS — Z01812 Encounter for preprocedural laboratory examination: Secondary | ICD-10-CM | POA: Insufficient documentation

## 2013-05-25 DIAGNOSIS — Z01818 Encounter for other preprocedural examination: Secondary | ICD-10-CM | POA: Insufficient documentation

## 2013-05-25 HISTORY — DX: Cardiac arrhythmia, unspecified: I49.9

## 2013-05-25 HISTORY — DX: Unspecified intellectual disabilities: F79

## 2013-05-25 HISTORY — DX: Personal history of other (healed) physical injury and trauma: Z87.828

## 2013-05-25 LAB — BASIC METABOLIC PANEL
BUN: 8 mg/dL (ref 6–23)
Chloride: 103 mEq/L (ref 96–112)
Creatinine, Ser: 0.56 mg/dL (ref 0.50–1.10)
Glucose, Bld: 90 mg/dL (ref 70–99)
Potassium: 3.6 mEq/L (ref 3.5–5.1)

## 2013-05-25 LAB — CBC
HCT: 38 % (ref 36.0–46.0)
Hemoglobin: 12.7 g/dL (ref 12.0–15.0)
MCH: 30.6 pg (ref 26.0–34.0)
MCHC: 33.4 g/dL (ref 30.0–36.0)
RDW: 12.8 % (ref 11.5–15.5)

## 2013-05-25 NOTE — Patient Instructions (Signed)
Your procedure is scheduled on:05/30/13  Enter through the Main Entrance at :12 noon Pick up desk phone and dial [phone] and inform us of your arrival.  Please call [phone] if you have any problems the morning of surgery.  Remember: Do not eat food after midnight:Monday Clear liquids are ok until:9am on Tuesday   You may brush your teeth the morning of surgery.  Take these meds the morning of surgery with a sip of water: all usual morning meds  DO NOT wear jewelry, eye make-up, lipstick,body lotion, or dark fingernail polish.  (Polished toes are ok) You may wear deodorant.  If you are to be admitted after surgery, leave suitcase in car until your room has been assigned. Patients discharged on the day of surgery will not be allowed to drive home. Wear loose fitting, comfortable clothes for your ride home.

## 2013-05-30 ENCOUNTER — Observation Stay (HOSPITAL_COMMUNITY)
Admission: RE | Admit: 2013-05-30 | Discharge: 2013-05-31 | Disposition: A | Discharge status: Home / Self Care | Payer: Medicare Other | Source: Ambulatory Visit | Attending: Obstetrics and Gynecology | Admitting: Obstetrics and Gynecology

## 2013-05-30 ENCOUNTER — Encounter (HOSPITAL_COMMUNITY): Payer: Self-pay | Admitting: Anesthesiology

## 2013-05-30 ENCOUNTER — Ambulatory Visit (HOSPITAL_COMMUNITY): Payer: Medicare Other | Admitting: Anesthesiology

## 2013-05-30 ENCOUNTER — Encounter (HOSPITAL_COMMUNITY)
Admission: RE | Disposition: A | Discharge status: Home / Self Care | Payer: Self-pay | Source: Ambulatory Visit | Attending: Obstetrics and Gynecology

## 2013-05-30 ENCOUNTER — Ambulatory Visit: Admit: 2013-05-30 | Payer: Self-pay | Admitting: Obstetrics and Gynecology

## 2013-05-30 ENCOUNTER — Encounter (HOSPITAL_COMMUNITY): Payer: Self-pay | Admitting: Registered Nurse

## 2013-05-30 DIAGNOSIS — Q5128 Other doubling of uterus, other specified: Secondary | ICD-10-CM | POA: Insufficient documentation

## 2013-05-30 DIAGNOSIS — N946 Dysmenorrhea, unspecified: Principal | ICD-10-CM | POA: Insufficient documentation

## 2013-05-30 DIAGNOSIS — N926 Irregular menstruation, unspecified: Secondary | ICD-10-CM | POA: Insufficient documentation

## 2013-05-30 DIAGNOSIS — N938 Other specified abnormal uterine and vaginal bleeding: Secondary | ICD-10-CM | POA: Insufficient documentation

## 2013-05-30 DIAGNOSIS — N8000 Endometriosis of the uterus, unspecified: Secondary | ICD-10-CM | POA: Insufficient documentation

## 2013-05-30 DIAGNOSIS — Z90710 Acquired absence of both cervix and uterus: Secondary | ICD-10-CM

## 2013-05-30 DIAGNOSIS — N949 Unspecified condition associated with female genital organs and menstrual cycle: Secondary | ICD-10-CM | POA: Insufficient documentation

## 2013-05-30 DIAGNOSIS — D251 Intramural leiomyoma of uterus: Secondary | ICD-10-CM | POA: Insufficient documentation

## 2013-05-30 HISTORY — PX: CYSTOSCOPY: SHX5120

## 2013-05-30 HISTORY — PX: LAPAROSCOPIC HYSTERECTOMY: SHX1926

## 2013-05-30 SURGERY — HYSTERECTOMY, TOTAL, LAPAROSCOPIC
Anesthesia: General

## 2013-05-30 SURGERY — HYSTERECTOMY, TOTAL, LAPAROSCOPIC
Anesthesia: General | Site: Urethra | Laterality: Right | Wound class: Clean Contaminated

## 2013-05-30 MED ORDER — DIAZEPAM 5 MG PO TABS
5.0000 mg | ORAL_TABLET | Freq: Every day | ORAL | Status: DC | PRN
  Filled 2013-05-30: qty 1

## 2013-05-30 MED ORDER — INDIGOTINDISULFONATE SODIUM 8 MG/ML IJ SOLN
INTRAMUSCULAR | Status: AC
  Filled 2013-05-30: qty 5

## 2013-05-30 MED ORDER — ACETAMINOPHEN 160 MG/5ML PO SOLN
ORAL | Status: AC
  Filled 2013-05-30: qty 20.3

## 2013-05-30 MED ORDER — KETOROLAC TROMETHAMINE 30 MG/ML IJ SOLN
30.0000 mg | Freq: Four times a day (QID) | INTRAMUSCULAR | Status: DC
  Administered 2013-05-31 (×2): 30 mg via INTRAVENOUS
  Filled 2013-05-30 (×2): qty 1

## 2013-05-30 MED ORDER — ONDANSETRON HCL 4 MG/2ML IJ SOLN
INTRAMUSCULAR | Status: AC
  Filled 2013-05-30: qty 2

## 2013-05-30 MED ORDER — ADULT MULTIVITAMIN W/MINERALS CH
1.0000 | ORAL_TABLET | Freq: Every day | ORAL | Status: DC
  Administered 2013-05-31: 1 via ORAL
  Filled 2013-05-30: qty 1

## 2013-05-30 MED ORDER — CEFAZOLIN SODIUM-DEXTROSE 2-3 GM-% IV SOLR
INTRAVENOUS | Status: AC
  Filled 2013-05-30: qty 50

## 2013-05-30 MED ORDER — ACYCLOVIR 200 MG PO CAPS
200.0000 mg | ORAL_CAPSULE | Freq: Three times a day (TID) | ORAL | Status: DC | PRN
  Filled 2013-05-30: qty 1

## 2013-05-30 MED ORDER — FENTANYL CITRATE 0.05 MG/ML IJ SOLN
INTRAMUSCULAR | Status: AC
  Filled 2013-05-30: qty 5

## 2013-05-30 MED ORDER — GLYCOPYRROLATE 0.2 MG/ML IJ SOLN
INTRAMUSCULAR | Status: DC | PRN
  Administered 2013-05-30: 0.4 mg via INTRAVENOUS
  Administered 2013-05-30: 0.2 mg via INTRAVENOUS

## 2013-05-30 MED ORDER — LIDOCAINE HCL (CARDIAC) 20 MG/ML IV SOLN
INTRAVENOUS | Status: AC
  Filled 2013-05-30: qty 5

## 2013-05-30 MED ORDER — FENTANYL CITRATE 0.05 MG/ML IJ SOLN
INTRAMUSCULAR | Status: AC
  Filled 2013-05-30: qty 2

## 2013-05-30 MED ORDER — MIDAZOLAM HCL 2 MG/2ML IJ SOLN: 0.5000 mg | Freq: Once | INTRAMUSCULAR | Status: DC | PRN

## 2013-05-30 MED ORDER — INDIGOTINDISULFONATE SODIUM 8 MG/ML IJ SOLN
INTRAMUSCULAR | Status: DC | PRN
  Administered 2013-05-30: 5 mL via INTRAVENOUS

## 2013-05-30 MED ORDER — ONDANSETRON HCL 4 MG/2ML IJ SOLN
INTRAMUSCULAR | Status: DC | PRN
  Administered 2013-05-30: 4 mg via INTRAVENOUS

## 2013-05-30 MED ORDER — GLYCOPYRROLATE 0.2 MG/ML IJ SOLN
INTRAMUSCULAR | Status: AC
  Filled 2013-05-30: qty 3

## 2013-05-30 MED ORDER — ESCITALOPRAM OXALATE 10 MG PO TABS
10.0000 mg | ORAL_TABLET | Freq: Every day | ORAL | Status: DC
  Administered 2013-05-30: 10 mg via ORAL
  Filled 2013-05-30: qty 1

## 2013-05-30 MED ORDER — FENTANYL CITRATE 0.05 MG/ML IJ SOLN
25.0000 ug | INTRAMUSCULAR | Status: DC | PRN
  Administered 2013-05-30 (×2): 50 ug via INTRAVENOUS

## 2013-05-30 MED ORDER — DEXAMETHASONE SODIUM PHOSPHATE 10 MG/ML IJ SOLN
INTRAMUSCULAR | Status: AC
  Filled 2013-05-30: qty 1

## 2013-05-30 MED ORDER — MIDAZOLAM HCL 5 MG/5ML IJ SOLN
INTRAMUSCULAR | Status: DC | PRN
  Administered 2013-05-30: 2 mg via INTRAVENOUS

## 2013-05-30 MED ORDER — METOCLOPRAMIDE HCL 5 MG/ML IJ SOLN
10.0000 mg | Freq: Once | INTRAMUSCULAR | Status: AC
  Administered 2013-05-30: 10 mg via INTRAVENOUS

## 2013-05-30 MED ORDER — HYDROMORPHONE HCL PF 1 MG/ML IJ SOLN
INTRAMUSCULAR | Status: AC
  Filled 2013-05-30: qty 1

## 2013-05-30 MED ORDER — BUPIVACAINE HCL (PF) 0.25 % IJ SOLN
INTRAMUSCULAR | Status: AC
  Filled 2013-05-30: qty 60

## 2013-05-30 MED ORDER — NEOSTIGMINE METHYLSULFATE 1 MG/ML IJ SOLN
INTRAMUSCULAR | Status: AC
  Filled 2013-05-30: qty 1

## 2013-05-30 MED ORDER — METOCLOPRAMIDE HCL 5 MG/ML IJ SOLN
INTRAMUSCULAR | Status: AC
  Filled 2013-05-30: qty 2

## 2013-05-30 MED ORDER — ACETAMINOPHEN 160 MG/5ML PO SOLN
ORAL | Status: AC
  Administered 2013-05-30: 975 mg via ORAL
  Filled 2013-05-30: qty 20.3

## 2013-05-30 MED ORDER — PROMETHAZINE HCL 25 MG/ML IJ SOLN: 6.2500 mg | INTRAMUSCULAR | Status: DC | PRN

## 2013-05-30 MED ORDER — FLUTICASONE PROPIONATE HFA 44 MCG/ACT IN AERO: 1.0000 | INHALATION_SPRAY | Freq: Two times a day (BID) | RESPIRATORY_TRACT | Status: DC | PRN

## 2013-05-30 MED ORDER — MENTHOL 3 MG MT LOZG: 1.0000 | LOZENGE | OROMUCOSAL | Status: DC | PRN

## 2013-05-30 MED ORDER — HYDROMORPHONE 0.3 MG/ML IV SOLN
INTRAVENOUS | Status: DC
  Administered 2013-05-30: 20:00:00 via INTRAVENOUS
  Administered 2013-05-30: 1.19 mg via INTRAVENOUS
  Administered 2013-05-31: 0.4 mg via INTRAVENOUS
  Administered 2013-05-31: 0.199 mg via INTRAVENOUS
  Filled 2013-05-30: qty 25

## 2013-05-30 MED ORDER — DEXAMETHASONE SODIUM PHOSPHATE 10 MG/ML IJ SOLN
INTRAMUSCULAR | Status: DC | PRN
  Administered 2013-05-30: 10 mg via INTRAVENOUS

## 2013-05-30 MED ORDER — ROCURONIUM BROMIDE 50 MG/5ML IV SOLN
INTRAVENOUS | Status: AC
  Filled 2013-05-30: qty 1

## 2013-05-30 MED ORDER — BUPIVACAINE HCL (PF) 0.25 % IJ SOLN
INTRAMUSCULAR | Status: DC | PRN
  Administered 2013-05-30: 3 mL
  Administered 2013-05-30: 6 mL

## 2013-05-30 MED ORDER — LACTATED RINGERS IV SOLN
INTRAVENOUS | Status: DC
  Administered 2013-05-30 (×2): via INTRAVENOUS

## 2013-05-30 MED ORDER — MEPERIDINE HCL 25 MG/ML IJ SOLN
6.2500 mg | INTRAMUSCULAR | Status: DC | PRN
  Administered 2013-05-30: 12.5 mg via INTRAVENOUS

## 2013-05-30 MED ORDER — DIPHENHYDRAMINE HCL 50 MG/ML IJ SOLN: 12.5000 mg | Freq: Four times a day (QID) | INTRAMUSCULAR | Status: DC | PRN

## 2013-05-30 MED ORDER — SODIUM CHLORIDE 0.9 % IJ SOLN
9.0000 mL | INTRAMUSCULAR | Status: DC | PRN
  Administered 2013-05-31: 9 mL via INTRAVENOUS

## 2013-05-30 MED ORDER — LACTATED RINGERS IV SOLN
INTRAVENOUS | Status: DC
  Administered 2013-05-31: 03:00:00 via INTRAVENOUS

## 2013-05-30 MED ORDER — ALBUTEROL SULFATE HFA 108 (90 BASE) MCG/ACT IN AERS: 2.0000 | INHALATION_SPRAY | Freq: Four times a day (QID) | RESPIRATORY_TRACT | Status: DC | PRN

## 2013-05-30 MED ORDER — HYDROMORPHONE HCL PF 1 MG/ML IJ SOLN
INTRAMUSCULAR | Status: DC | PRN
  Administered 2013-05-30: 1 mg via INTRAVENOUS

## 2013-05-30 MED ORDER — LIDOCAINE HCL (CARDIAC) 20 MG/ML IV SOLN
INTRAVENOUS | Status: DC | PRN
  Administered 2013-05-30: 20 mg via INTRAVENOUS

## 2013-05-30 MED ORDER — NEOSTIGMINE METHYLSULFATE 1 MG/ML IJ SOLN
INTRAMUSCULAR | Status: DC | PRN
  Administered 2013-05-30: 2 mg via INTRAVENOUS

## 2013-05-30 MED ORDER — ATORVASTATIN CALCIUM 40 MG PO TABS: 40.0000 mg | ORAL_TABLET | Freq: Every day | ORAL | Status: DC

## 2013-05-30 MED ORDER — FENTANYL CITRATE 0.05 MG/ML IJ SOLN
INTRAMUSCULAR | Status: DC | PRN
  Administered 2013-05-30: 50 ug via INTRAVENOUS
  Administered 2013-05-30: 100 ug via INTRAVENOUS
  Administered 2013-05-30: 50 ug via INTRAVENOUS
  Administered 2013-05-30 (×2): 100 ug via INTRAVENOUS
  Administered 2013-05-30 (×2): 50 ug via INTRAVENOUS

## 2013-05-30 MED ORDER — KETOROLAC TROMETHAMINE 30 MG/ML IJ SOLN: 15.0000 mg | Freq: Once | INTRAMUSCULAR | Status: DC | PRN

## 2013-05-30 MED ORDER — ALBUTEROL SULFATE HFA 108 (90 BASE) MCG/ACT IN AERS
INHALATION_SPRAY | RESPIRATORY_TRACT | Status: AC
  Filled 2013-05-30: qty 6.7

## 2013-05-30 MED ORDER — PANTOPRAZOLE SODIUM 40 MG PO TBEC
40.0000 mg | DELAYED_RELEASE_TABLET | Freq: Every day | ORAL | Status: DC
  Administered 2013-05-30 – 2013-05-31 (×2): 40 mg via ORAL
  Filled 2013-05-30 (×2): qty 1

## 2013-05-30 MED ORDER — PHENOBARBITAL 64.8 MG PO TABS
64.8000 mg | ORAL_TABLET | Freq: Every day | ORAL | Status: DC
  Administered 2013-05-31: 64.8 mg via ORAL
  Filled 2013-05-30: qty 1

## 2013-05-30 MED ORDER — QUETIAPINE FUMARATE 100 MG PO TABS
100.0000 mg | ORAL_TABLET | Freq: Every day | ORAL | Status: DC
  Administered 2013-05-30: 100 mg via ORAL
  Filled 2013-05-30 (×2): qty 1

## 2013-05-30 MED ORDER — PROPOFOL 10 MG/ML IV BOLUS
INTRAVENOUS | Status: DC | PRN
  Administered 2013-05-30: 200 mg via INTRAVENOUS

## 2013-05-30 MED ORDER — ATROPINE SULFATE 0.4 MG/ML IJ SOLN
INTRAMUSCULAR | Status: DC | PRN
  Administered 2013-05-30: 0.4 mg via INTRAVENOUS

## 2013-05-30 MED ORDER — MIDAZOLAM HCL 2 MG/2ML IJ SOLN
INTRAMUSCULAR | Status: AC
  Filled 2013-05-30: qty 2

## 2013-05-30 MED ORDER — ATROPINE SULFATE 0.4 MG/ML IJ SOLN
INTRAMUSCULAR | Status: AC
  Filled 2013-05-30: qty 1

## 2013-05-30 MED ORDER — ACETAMINOPHEN 160 MG/5ML PO SOLN: 975.0000 mg | Freq: Once | ORAL | Status: AC

## 2013-05-30 MED ORDER — ZOLPIDEM TARTRATE 5 MG PO TABS: 5.0000 mg | ORAL_TABLET | Freq: Every evening | ORAL | Status: DC | PRN

## 2013-05-30 MED ORDER — DIPHENHYDRAMINE HCL 12.5 MG/5ML PO ELIX: 12.5000 mg | ORAL_SOLUTION | Freq: Four times a day (QID) | ORAL | Status: DC | PRN

## 2013-05-30 MED ORDER — CLONAZEPAM 0.5 MG PO TABS
2.0000 mg | ORAL_TABLET | Freq: Three times a day (TID) | ORAL | Status: DC
  Administered 2013-05-30 – 2013-05-31 (×2): 2 mg via ORAL
  Filled 2013-05-30 (×2): qty 4

## 2013-05-30 MED ORDER — KETOROLAC TROMETHAMINE 30 MG/ML IJ SOLN
INTRAMUSCULAR | Status: DC | PRN
  Administered 2013-05-30: 30 mg via INTRAVENOUS

## 2013-05-30 MED ORDER — HYDROCODONE-ACETAMINOPHEN 5-325 MG PO TABS
1.0000 | ORAL_TABLET | ORAL | Status: DC | PRN
  Filled 2013-05-30: qty 2

## 2013-05-30 MED ORDER — CEFAZOLIN SODIUM-DEXTROSE 2-3 GM-% IV SOLR
2.0000 g | INTRAVENOUS | Status: AC
  Administered 2013-05-30: 2 g via INTRAVENOUS

## 2013-05-30 MED ORDER — KETOROLAC TROMETHAMINE 30 MG/ML IJ SOLN: 30.0000 mg | Freq: Four times a day (QID) | INTRAMUSCULAR | Status: DC

## 2013-05-30 MED ORDER — PRAMIPEXOLE DIHYDROCHLORIDE 0.125 MG PO TABS
0.1250 mg | ORAL_TABLET | Freq: Three times a day (TID) | ORAL | Status: DC
  Administered 2013-05-31 (×2): 0.125 mg via ORAL
  Filled 2013-05-30 (×2): qty 1

## 2013-05-30 MED ORDER — IBUPROFEN 600 MG PO TABS
600.0000 mg | ORAL_TABLET | Freq: Four times a day (QID) | ORAL | Status: DC | PRN
  Administered 2013-05-31: 600 mg via ORAL
  Filled 2013-05-30: qty 1

## 2013-05-30 MED ORDER — MEPERIDINE HCL 25 MG/ML IJ SOLN
INTRAMUSCULAR | Status: AC
  Filled 2013-05-30: qty 1

## 2013-05-30 MED ORDER — ONDANSETRON HCL 4 MG/2ML IJ SOLN
4.0000 mg | Freq: Four times a day (QID) | INTRAMUSCULAR | Status: DC | PRN
  Administered 2013-05-30: 4 mg via INTRAVENOUS
  Filled 2013-05-30: qty 2

## 2013-05-30 MED ORDER — ROCURONIUM BROMIDE 100 MG/10ML IV SOLN
INTRAVENOUS | Status: DC | PRN
  Administered 2013-05-30: 10 mg via INTRAVENOUS
  Administered 2013-05-30: 20 mg via INTRAVENOUS
  Administered 2013-05-30: 60 mg via INTRAVENOUS

## 2013-05-30 MED ORDER — PROPOFOL 10 MG/ML IV EMUL
INTRAVENOUS | Status: AC
  Filled 2013-05-30: qty 20

## 2013-05-30 MED ORDER — NALOXONE HCL 0.4 MG/ML IJ SOLN: 0.4000 mg | INTRAMUSCULAR | Status: DC | PRN

## 2013-05-30 SURGICAL SUPPLY — 89 items
BARRIER ADHS 3X4 INTERCEED (GAUZE/BANDAGES/DRESSINGS) IMPLANT
CABLE HIGH FREQUENCY MONO STRZ (ELECTRODE) ×4 IMPLANT
CANISTER SUCTION 2500CC (MISCELLANEOUS) ×4 IMPLANT
CATH FOLEY 3WAY  5CC 16FR (CATHETERS)
CATH FOLEY 3WAY 5CC 16FR (CATHETERS) IMPLANT
CATH ROBINSON RED A/P 16FR (CATHETERS) IMPLANT
CHLORAPREP W/TINT 26ML (MISCELLANEOUS) ×4 IMPLANT
CLOTH BEACON ORANGE TIMEOUT ST (SAFETY) ×4 IMPLANT
CONT PATH 16OZ SNAP LID 3702 (MISCELLANEOUS) ×4 IMPLANT
COVER MAYO STAND STRL (DRAPES) ×4 IMPLANT
COVER TABLE BACK 60X90 (DRAPES) ×4 IMPLANT
DECANTER SPIKE VIAL GLASS SM (MISCELLANEOUS) IMPLANT
DERMABOND ADVANCED (GAUZE/BANDAGES/DRESSINGS) ×1
DERMABOND ADVANCED .7 DNX12 (GAUZE/BANDAGES/DRESSINGS) ×3 IMPLANT
DEVICE TROCAR PUNCTURE CLOSURE (ENDOMECHANICALS) ×4 IMPLANT
DISSECTOR BLUNT TIP ENDO 5MM (MISCELLANEOUS) IMPLANT
DISSECTOR SPONGE CHERRY (GAUZE/BANDAGES/DRESSINGS) IMPLANT
DRAPE PROXIMA HALF (DRAPES) ×4 IMPLANT
ELECT REM PT RETURN 9FT ADLT (ELECTROSURGICAL)
ELECTRODE REM PT RTRN 9FT ADLT (ELECTROSURGICAL) IMPLANT
EVACUATOR SMOKE 8.L (FILTER) ×8 IMPLANT
FORCEPS CUTTING 33CM 5MM (CUTTING FORCEPS) IMPLANT
GAUZE PACKING 2X5 YD STERILE (GAUZE/BANDAGES/DRESSINGS) IMPLANT
GAUZE SPONGE 4X4 16PLY XRAY LF (GAUZE/BANDAGES/DRESSINGS) ×4 IMPLANT
GAUZE VASELINE 3X9 (GAUZE/BANDAGES/DRESSINGS) IMPLANT
GLOVE BIO SURGEON STRL SZ 6.5 (GLOVE) ×8 IMPLANT
GLOVE BIOGEL PI IND STRL 6.5 (GLOVE) ×3 IMPLANT
GLOVE BIOGEL PI IND STRL 7.0 (GLOVE) ×3 IMPLANT
GLOVE BIOGEL PI IND STRL 7.5 (GLOVE) ×3 IMPLANT
GLOVE BIOGEL PI INDICATOR 6.5 (GLOVE) ×1
GLOVE BIOGEL PI INDICATOR 7.0 (GLOVE) ×1
GLOVE BIOGEL PI INDICATOR 7.5 (GLOVE) ×1
GOWN PREVENTION PLUS LG XLONG (DISPOSABLE) ×12 IMPLANT
GOWN STRL REIN XL XLG (GOWN DISPOSABLE) ×16 IMPLANT
HEMOSTAT SURGICEL 2X14 (HEMOSTASIS) ×4 IMPLANT
NEEDLE HYPO 25X1 1.5 SAFETY (NEEDLE) IMPLANT
NEEDLE MAYO .5 CIRCLE (NEEDLE) ×4 IMPLANT
NS IRRIG 1000ML POUR BTL (IV SOLUTION) ×4 IMPLANT
OCCLUDER COLPOPNEUMO (BALLOONS) ×4 IMPLANT
PACK LAPAROSCOPY BASIN (CUSTOM PROCEDURE TRAY) ×4 IMPLANT
PACK LAVH (CUSTOM PROCEDURE TRAY) ×4 IMPLANT
PAD OB MATERNITY 4.3X12.25 (PERSONAL CARE ITEMS) ×4 IMPLANT
PROTECTOR NERVE ULNAR (MISCELLANEOUS) ×4 IMPLANT
SCALPEL HARMONIC ACE (MISCELLANEOUS) ×8 IMPLANT
SCISSORS LAP 5X35 DISP (ENDOMECHANICALS) ×4 IMPLANT
SET CYSTO W/LG BORE CLAMP LF (SET/KITS/TRAYS/PACK) ×4 IMPLANT
SET IRRIG TUBING LAPAROSCOPIC (IRRIGATION / IRRIGATOR) ×4 IMPLANT
SOLUTION ELECTROLUBE (MISCELLANEOUS) ×4 IMPLANT
SPONGE LAP 18X18 X RAY DECT (DISPOSABLE) ×8 IMPLANT
SPONGE SURGIFOAM ABS GEL 12-7 (HEMOSTASIS) IMPLANT
STAPLER VISISTAT 35W (STAPLE) IMPLANT
STRIP CLOSURE SKIN 1/2X4 (GAUZE/BANDAGES/DRESSINGS) ×4 IMPLANT
STRIP CLOSURE SKIN 1/4X3 (GAUZE/BANDAGES/DRESSINGS) IMPLANT
STRIP CLOSURE SKIN 1/4X4 (GAUZE/BANDAGES/DRESSINGS) IMPLANT
SUT CHROMIC 0 CT 1 (SUTURE) IMPLANT
SUT CHROMIC 0 UR 5 27 (SUTURE) IMPLANT
SUT MNCRL AB 3-0 PS2 27 (SUTURE) ×8 IMPLANT
SUT MON AB 3-0 SH 27 (SUTURE) ×1
SUT MON AB 3-0 SH27 (SUTURE) ×3 IMPLANT
SUT PDS AB 0 CT1 27 (SUTURE) IMPLANT
SUT PDS AB 1 CT1 36 (SUTURE) ×28 IMPLANT
SUT PLAIN 2 0 XLH (SUTURE) IMPLANT
SUT VIC AB 0 CT1 18XCR BRD8 (SUTURE) IMPLANT
SUT VIC AB 0 CT1 27 (SUTURE)
SUT VIC AB 0 CT1 27XBRD ANBCTR (SUTURE) IMPLANT
SUT VIC AB 0 CT1 36 (SUTURE) ×4 IMPLANT
SUT VIC AB 0 CT1 8-18 (SUTURE)
SUT VIC AB 2-0 SH 27 (SUTURE)
SUT VIC AB 2-0 SH 27XBRD (SUTURE) IMPLANT
SUT VICRYL 0 ENDOLOOP (SUTURE) IMPLANT
SUT VICRYL 0 TIES 12 18 (SUTURE) IMPLANT
SUT VICRYL 0 UR6 27IN ABS (SUTURE) ×8 IMPLANT
SYR 50ML LL SCALE MARK (SYRINGE) ×4 IMPLANT
SYR BULB IRRIGATION 50ML (SYRINGE) ×4 IMPLANT
SYR CONTROL 10ML LL (SYRINGE) IMPLANT
SYR TB 1ML LUER SLIP (SYRINGE) ×4 IMPLANT
TIP UTERINE 5.1X6CM LAV DISP (MISCELLANEOUS) IMPLANT
TIP UTERINE 6.7X10CM GRN DISP (MISCELLANEOUS) IMPLANT
TIP UTERINE 6.7X6CM WHT DISP (MISCELLANEOUS) IMPLANT
TIP UTERINE 6.7X8CM BLUE DISP (MISCELLANEOUS) ×4 IMPLANT
TOWEL OR 17X24 6PK STRL BLUE (TOWEL DISPOSABLE) ×8 IMPLANT
TRAY FOLEY CATH 14FR (SET/KITS/TRAYS/PACK) ×4 IMPLANT
TROCAR BALLN 12MMX100 BLUNT (TROCAR) ×4 IMPLANT
TROCAR XCEL NON-BLD 11X100MML (ENDOMECHANICALS) ×4 IMPLANT
TROCAR XCEL NON-BLD 5MMX100MML (ENDOMECHANICALS) ×4 IMPLANT
TROCAR XCEL OPT SLVE 5M 100M (ENDOMECHANICALS) ×4 IMPLANT
TUBING FILTER THERMOFLATOR (ELECTROSURGICAL) ×4 IMPLANT
WARMER LAPAROSCOPE (MISCELLANEOUS) ×4 IMPLANT
WATER STERILE IRR 1000ML POUR (IV SOLUTION) ×4 IMPLANT

## 2013-05-30 NOTE — Anesthesia Preprocedure Evaluation (Signed)
Anesthesia Evaluation  Patient identified by MRN, date of birth, ID band Patient awake    Reviewed: Allergy & Precautions, H&P , Patient's Chart, lab work & pertinent test results, reviewed documented beta blocker date and time   History of Anesthesia Complications Negative for: history of anesthetic complications  Airway Mallampati: II TM Distance: >3 FB Neck ROM: full    Dental no notable dental hx.    Pulmonary neg pulmonary ROS,  breath sounds clear to auscultation  Pulmonary exam normal       Cardiovascular Exercise Tolerance: Good negative cardio ROS  + dysrhythmias Rhythm:regular Rate:Normal     Neuro/Psych  Headaches, Seizures -,  PSYCHIATRIC DISORDERS Anxiety Depression negative neurological ROS  negative psych ROS   GI/Hepatic negative GI ROS, Neg liver ROS,   Endo/Other  negative endocrine ROS  Renal/GU negative Renal ROS     Musculoskeletal   Abdominal   Peds  Hematology negative hematology ROS (+)   Anesthesia Other Findings Vaginal yeast infection 2007 recurrent  PMS (premenstrual syndrome)        Dysmenorrhea     Yeast vaginitis        Dysuria     High blood cholesterol        Perimenopausal vasomotor symptoms     HSV-2 infection        Anxiety     Bipolar disorder        Menorrhagia     Mass of ovary   left side    Uterine mass     Hx of ovarian cyst        Hx of seizure disorder     Dysplasia        Abnormal Pap smear     Hx of hypercholesterolemia        PMS (premenstrual syndrome)     PMS (premenstrual syndrome)        Headache(784.0)     RLS (restless legs syndrome)        Mild mental retardation     Seizures        Dysrhythmia   episodes of palpitations H/O head injury        Mental retardation   mild per Dr. Hattie Perch in [facility]             Reproductive/Obstetrics negative OB ROS                           Anesthesia Physical Anesthesia Plan  ASA: III  Anesthesia  Plan: General ETT   Post-op Pain Management:    Induction:   Airway Management Planned:   Additional Equipment:   Intra-op Plan:   Post-operative Plan:   Informed Consent: I have reviewed the patients History and Physical, chart, labs and discussed the procedure including the risks, benefits and alternatives for the proposed anesthesia with the patient or authorized representative who has indicated his/her understanding and acceptance.   Dental Advisory Given  Plan Discussed with: CRNA and Surgeon  Anesthesia Plan Comments:         Anesthesia Quick Evaluation

## 2013-05-30 NOTE — Preoperative (Signed)
Beta Blockers   Reason not to administer Beta Blockers:Not Applicable 

## 2013-05-30 NOTE — H&P (View-Only) (Signed)
Teresa Lawrence is a 47 y.o. female G 0,  who is S/P Endometrial Ablation and  presents for hysterectomy because of irregular bleeding and dysmenorrhea.  In the past several months, the patient reports bleeding twice a month with "bad" cramps rated as a 9/10 on a 10 point pain scale.  She does find some relief with Midol, decreasing her pain to 5/10 on a 10 point pain scale.  She admits to increase irritability and insomnia but denies vaginitis symptoms, urinary tract symptoms or changes in bowel function.  An endometrial biopsy showed superficial fragments of atrophic endometrial fragments.  Given that the patient's ablation did not stop her vaginal bleeding and the severity of her menstrual cramping, the patient has decided to proceed with definitive therapy in the form of hysterectomy.  The patient was advised or medications, repeat surgicial  procedures  and possibly enduring her discomfort but she was emphatic about proceeding with the hysterectomy.   Past Medical History  OB History: G0  GYN History: menarche- 47 YO;   LMP: 05/03/13;    Contracepton abstinence  The patient reports a past history of: herpes.  Has a remote history of abnormal PAP smear but have been normal in recent years;  Last PAP smear-08/22/2012  Medical History: Bipolar disorder, anxiety, panic disorder, mild cognitive delay, hypercholesterolemia, insomnia, migraine, and epilepsy.  Surgical History:  2011- Hysteroscopy with D & C, Polypectomy and Endometrial Ablation (Thermachoice) Denies problems with anesthesia or history of blood transfusions  Family History:  Heart disease, asthma, emphysema  Social History:  Single and disabled; Former tobacco smoker, denies alcohol or illicit drug use; Patient is Vegetarian   Medication: Acyclovir 200 mg prn Crestor 40 mg daily Diazepam 5 mg bid Escitalopram 10 mg daily Flovent HFA 2 puffs prn as directed Klonipin 2 mg tid Omeprazole 20 mg daily Phenadoz 25 mg RectalSuppository  prn Phenobarbital 64.8 mg daily Pramipexole 0.25 mg  qhs  Allergies  Allergen Reactions  . Requip (Ropinirole Hcl) Nausea And Vomiting  Promethazine causes nausea and vomiting  Denies sensitivity to peanuts, shellfish, soy, latex or adhesives.   ROS: Admits to reading glasses, occasional nausea, right ankle swelling with menses, headaches with migraine episodes;  Denies  vision changes, nasal congestion, dysphagia, tinnitus, dizziness, hoarseness, cough,  chest pain, shortness of breath, nausea, vomiting, diarrhea,constipation,  urinary frequency, urgency  dysuria, hematuria, vaginitis symptoms, pelvic pain, swelling of joints,easy bruising,  myalgias, arthralgias, skin rashes, unexplained weight loss and except as is mentioned in the history of present illness, patient's review of systems is otherwise negative.  Physical Exam  Bp-102/70,  P-100,  R-14,  Temp.-98.7 degrees F orally,  Weight-202 lbs.,  Height- 5'3"  BMI-35.8  Neck: supple without masses or thyromegaly Lungs: clear to auscultation Heart: regular rate and rhythm Abdomen: soft, non-tender and no organomegaly Pelvic:EGBUS- wnl; vagina-normal rugae; uterus-normal size, cervix without lesions or motion tenderness; adnexae-no tenderness or masses Extremities:  no clubbing, cyanosis or edema   Assesment:  Irregular Bleeding                       Dysmenorrhea   Disposition:  A discussion was held with patient regarding the indication for her procedure(s) along with the risks, which include but are not limited to: reaction to anesthesia, damage to adjacent organs, infection, excessive bleeding and possible need for an open abdominal incision.  The patient was given a Miralax Bowel Prep to be completed the day before her surgery.  She  verbalized understanding of these risks and with her mother present, had all of her questions answered.  She has consented to undergo a Total Laparoscopic Hysterectomy with Bilateral Salpingectomy,  possible Laparoscopically Assisted Vaginal Hysterectomy or possible Total Abdominal Hysterectomy with Cystoscopy at [facility], May 30, 2013 at 1:30 p.m.   CSN# [id number]   Elon Lomeli J. Lowell Guitar, PA-C  for Dr. Pierre Bali.Dillard

## 2013-05-30 NOTE — Op Note (Signed)
Preop Diagnosis: Dysmenorrhea, Irregular cycles;    Postop Diagnosis: Dysmenorrhea, Irregular cycles;    Procedure: TLH, RIGHT SALPINGECTOMY AND CYSTOSCOPY  Anesthesia: General   Anesthesiologist: Responsible provider cannot be found from this context.   Attending: Michael Litter, MD   Assistant: Osborn Coho MD  Findings: ADHESIONS ON THE LEFT SIDE WALL.  UNABLE TO VISUALIZE THE TUBE OR OVARY ON THE LEFT SIDE.  ADHESIONS FROM THE BOWEL TO THE POSTERIOR CULDESAC.  RIGHT OVARY WAS ADHERENT TO THE UTERUS ON THE RIGHT SIDE.    Pathology: uterus and cervix and right fallopian tube  Fluids:  UOP: 375 ml  EBL: 150 ml  Complications: none  Procedure: The patient was taken to the operating room, placed under general anesthesia and prepped and draped in the normal sterile fashion. A Foley catheter was placed in the bladder. The uterus sounded to 8cm. A weighted speculum and vaginal retractors were placed in the vagina. Tenaculum was placed on the anterior lip of the cervix.  A size 8cm tip was used and the rumi was placed, tip balloon and occluder insufflated. Attention was then turned to the abdomen. A 10 mm infraumbilical incision was made with the scalpel after 5 cc of 25% percent Marcaine was used for local anesthesia. The subcutaneous tissue was dissected and the fascia was incised with the knife. A purse string stitch was placed in the fascia and Hassan placed into the intra-abdominal cavity and anchored to the suture. Intraabdominal placement was confirmed with the laparoscope.  Two 5 mm trochars were placed in the right and left lower quadrants under direct visualization with the laparoscope. Filmy adhesions were lysed that ran from the bowel to the culdesac.    The harmonic scalpel was used to cauterize and cut the uterine ovarian ligaments bilaterally.  The right fallopian tube was removed with the harmonic scalpel and placed in the culdesac.    Both round ligaments were  cauterized and cut with the harmonic scalpel as well and the bladder flap created with the harmonic scalpel and removed away from the uterus. Both uterine arteries were cauterized and cut with harmonic scalpel.  The harmonic was used to circumscribe the [facility] ring and free the uterus and cervix. The uterus was then pulled into the vagina. A 10 mm incision was made in the LLQ two finger breadths rostral and medial to the RLQ 5 mm incision.  Both angles were sutured with 1 PDS.  The  remainder of the cuff was sutured with 1 PDS using interrupted stitches until the vaginal cuff was closed.  A total of approximately 6 sutures were used.   Irrigation was performed.  Gas was allowed to leave the abdomen to check for bleeders and all pedicles were seen to be hemostatic the patient was given indigo carmine.  Cystoscopy was performed and both ureters were seen to efflux indigo carmine without difficulty. The bladder had full integrity with no suture or laceration visualized.  The vagina was inspected and the cuff was noted to be intact.  Attention was then turned back to the abdomen after removing top pair of gloves. The abdomen was reinsufflated with CO2 gas.   The abdomen and pelvis was copiously irrigated and  hemostasis was noted.  The 10 mm port on the patients left side was closed with 0 Vicryl using the fascial closure device. There was some bleeding at the round ligament pedicle that was made hemostatic with bipolar cautery.  Surgicel was also placed along the cuff where  there was some oozing and on the left round ligament pedicle where there was some oozing.    All trochars were removed under direct visualization using the laparoscope.  The umbilical fascia was reapproximated by tying the circumferential suture. The two 10 mm incisions were closed with 3-0 Monocryl via a subcuticular stitch.  All remaining skin incisions were closed with Dermabond and the 10 mm skin incisions were reinforced using Dermabond.  Sponge  lap and needle counts were correct.  The patient tolerated the procedure well and was returned to the PACU in stable condition

## 2013-05-30 NOTE — Anesthesia Postprocedure Evaluation (Signed)
  Anesthesia Post-op Note  Patient: Teresa Lawrence  Procedure(s) Performed: Procedure(s) with comments: HYSTERECTOMY TOTAL LAPAROSCOPIC (Right) - Right Salpingectomy CYSTOSCOPY (Bilateral)  Patient Location: PACU  Anesthesia Type:General  Level of Consciousness: awake, alert  and oriented  Airway and Oxygen Therapy: Patient Spontanous Breathing  Post-op Pain: mild  Post-op Assessment: Post-op Vital signs reviewed, Patient's Cardiovascular Status Stable, Respiratory Function Stable, Patent Airway and Pain level controlled  Post-op Vital Signs: Reviewed and stable  Complications: No apparent anesthesia complications

## 2013-05-30 NOTE — Interval H&P Note (Signed)
History and Physical Interval Note:  05/30/2013 1:00 PM  Teresa Lawrence  has presented today for surgery, with the diagnosis of Dysmenorrhea, Irregular cycles;   The various methods of treatment have been discussed with the patient and family. After consideration of risks, benefits and other options for treatment, the patient has consented to  Procedure(s): HYSTERECTOMY TOTAL LAPAROSCOPIC (N/A) LAPAROSCOPIC ASSISTED VAGINAL HYSTERECTOMY (N/A) HYSTERECTOMY ABDOMINAL (N/A) CYSTOSCOPY (Bilateral) as a surgical intervention .  The patient's history has been reviewed, patient examined, no change in status, stable for surgery.  I have reviewed the patient's chart and labs.  Questions were answered to the patient's satisfaction.     [facility]

## 2013-05-30 NOTE — Transfer of Care (Signed)
Immediate Anesthesia Transfer of Care Note  Patient: Teresa Lawrence  Procedure(s) Performed: Procedure(s) with comments: HYSTERECTOMY TOTAL LAPAROSCOPIC (Right) - Right Salpingectomy CYSTOSCOPY (Bilateral)  Patient Location: PACU  Anesthesia Type:General  Level of Consciousness: awake, alert  and oriented  Airway & Oxygen Therapy: Patient Spontanous Breathing and Patient connected to nasal cannula oxygen  Post-op Assessment: Report given to PACU RN and Post -op Vital signs reviewed and stable  Post vital signs: Reviewed and stable  Complications: No apparent anesthesia complications

## 2013-05-31 ENCOUNTER — Encounter (HOSPITAL_COMMUNITY): Payer: Self-pay | Admitting: *Deleted

## 2013-05-31 LAB — CBC
HCT: 31.9 % — ABNORMAL LOW (ref 36.0–46.0)
Hemoglobin: 10.6 g/dL — ABNORMAL LOW (ref 12.0–15.0)
MCH: 30.7 pg (ref 26.0–34.0)
MCV: 92.5 fL (ref 78.0–100.0)
RBC: 3.45 MIL/uL — ABNORMAL LOW (ref 3.87–5.11)

## 2013-05-31 LAB — BASIC METABOLIC PANEL
BUN: 4 mg/dL — ABNORMAL LOW (ref 6–23)
CO2: 26 mEq/L (ref 19–32)
Glucose, Bld: 114 mg/dL — ABNORMAL HIGH (ref 70–99)
Potassium: 3.6 mEq/L (ref 3.5–5.1)
Sodium: 139 mEq/L (ref 135–145)

## 2013-05-31 MED ORDER — IBUPROFEN 600 MG PO TABS: 600.0000 mg | ORAL_TABLET | Freq: Four times a day (QID) | ORAL | Status: AC | PRN

## 2013-05-31 MED ORDER — ONDANSETRON HCL 4 MG PO TABS: 4.0000 mg | ORAL_TABLET | Freq: Three times a day (TID) | ORAL | Status: DC | PRN

## 2013-05-31 MED ORDER — HYDROCODONE-ACETAMINOPHEN 5-325 MG PO TABS: 1.0000 | ORAL_TABLET | Freq: Four times a day (QID) | ORAL | Status: DC | PRN

## 2013-05-31 NOTE — Progress Notes (Signed)
Discharge instructions reviewed with patient and mother.  Both state understanding of home care, activity, medications, signs/symptoms to report to MD and return MD office visit.  No home equipment needed.  Patient discharged via wheelchair in stable condition with staff without incident.

## 2013-05-31 NOTE — Progress Notes (Signed)
Teresa Lawrence is a87 y.o.  [id number]  Post Op Date # TLH/RS/Cystoscopy  Subjective: Patient is Doing well postoperatively. Patient has Pain is controlled with current analgesics. Medications being used: PCA Dilaudid though she complains of acid reflux symptoms.., Last night had dizziness with ambulation so did not continue, tolerating liquids, still has Foley.  Objective: Vital signs in last 24 hours: Temp:  [97.7 F (36.5 C)-98.7 F (37.1 C)] 98 F (36.7 C) (08/13 0536) Pulse Rate:  [62-96] 62 (08/13 0536) Resp:  [10-29] 12 (08/13 0536) BP: (99-135)/(63-78) 119/76 mmHg (08/13 0536) SpO2:  [92 %-100 %] 98 % (08/13 0536) Weight:  [198 lb (89.812 kg)-200 lb (90.719 kg)] 200 lb (90.719 kg) (08/12 2050)  Intake/Output from previous day: 08/12 0701 - 08/13 0700 In: 4441.3 [P.O.:220; I.V.:4221.3] Out: 1950 [Urine:1800] Intake/Output this shift:    Recent Labs Lab 05/25/13 1120 05/31/13 0508  WBC 8.1 10.9*  HGB 12.7 10.6*  HCT 38.0 31.9*  PLT 275 216     Recent Labs Lab 05/25/13 1120 05/31/13 0508  NA 139 139  K 3.6 3.6  CL 103 106  CO2 27 26  BUN 8 4*  CREATININE 0.56 0.53  CALCIUM 9.2 8.5  GLUCOSE 90 114*    EXAM: General: Groggy but pleasant Resp: clear to auscultation bilaterally Cardio: regular rate and rhythm, S1, S2 normal, no murmur, click, rub or gallop GI: Soft, bowel sounds present, incisions intact without evidence of infection. Extremities: Homans sign is negative, no sign of DVT and no calf  tenderness.   Assessment: s/p Procedure(s): HYSTERECTOMY TOTAL LAPAROSCOPIC CYSTOSCOPY: stable  Plan: Advance diet Encourage ambulation Discontinue IV fluids Routine care  LOS: 1 day    Shanele Nissan, PA-C 05/31/2013 7:41 AM

## 2013-05-31 NOTE — Discharge Summary (Signed)
Physician Discharge Summary  Patient ID: Teresa Lawrence MRN: [id number] DOB/AGE: [birth year] 47 y.o.  Admit date: 05/30/2013 Discharge date: 05/31/2013   Discharge Diagnoses:   Dysmenorrhea and Irregular Bleeding Active Problems:   * No active hospital problems. *   Operation: Total Laparoscopic Hysterectomy, Right Salpingectomy and Cystoscopy   Discharged Condition: Good  Hospital Course: On the date of admission the patient underwent the aforementioned procedures, tolerating them well.  Post operative course was unremarkable with the patient resuming bowel and bladder function by post operative day #1 and therefore deemed ready for discharge home.  Disposition: 01-Home or Self Care  Discharge Medications:    Medication List    STOP taking these medications       acetaminophen 500 MG tablet  Commonly known as:  TYLENOL     diphenhydramine-acetaminophen 25-500 MG Tabs  Commonly known as:  TYLENOL PM     naproxen sodium 220 MG tablet  Commonly known as:  ANAPROX      TAKE these medications       acyclovir 200 MG capsule  Commonly known as:  ZOVIRAX  Take 200 mg by mouth 3 (three) times daily as needed (fever blisters).     albuterol 108 (90 BASE) MCG/ACT inhaler  Commonly known as:  PROVENTIL HFA;VENTOLIN HFA  Inhale 2 puffs into the lungs every 6 (six) hours as needed for wheezing.     clonazePAM 2 MG tablet  Commonly known as:  KLONOPIN  Take 2 mg by mouth 3 (three) times daily. May take additional dose at bedtime if needed     diazepam 5 MG tablet  Commonly known as:  VALIUM  Take 5 mg by mouth daily as needed for anxiety (mood swings).     DIPHENHYDRAMINE HCL (SLEEP) PO  Take 1 tablet by mouth at bedtime as needed (for sleep).     escitalopram 10 MG tablet  Commonly known as:  LEXAPRO  Take 1 tablet (10 mg total) by mouth daily.     fluticasone 44 MCG/ACT inhaler  Commonly known as:  FLOVENT HFA  Inhale 1-4 puffs into the lungs 2 (two) times daily  as needed (shortness of breath).     HYDROcodone-acetaminophen 5-325 MG per tablet  Commonly known as:  NORCO/VICODIN  Take 1-2 tablets by mouth every 6 (six) hours as needed.     hydrocortisone cream 1 %  Apply 1 application topically as needed (itching).     ibuprofen 600 MG tablet  Commonly known as:  ADVIL,MOTRIN  Take 1 tablet (600 mg total) by mouth every 6 (six) hours as needed (mild pain).     loperamide 2 MG capsule  Commonly known as:  IMODIUM  Take 2 mg by mouth 4 (four) times daily as needed for diarrhea or loose stools.     multivitamin with minerals tablet  Take 1 tablet by mouth daily.     neomycin-bacitracin-polymyxin [phone] ointment  Apply 1 application topically as needed (for cuts).     omeprazole 20 MG capsule  Commonly known as:  PRILOSEC  Take 20 mg by mouth 2 (two) times daily as needed (for reflux).     ondansetron 4 MG tablet  Commonly known as:  ZOFRAN  Take 1 tablet (4 mg total) by mouth every 8 (eight) hours as needed for nausea.     pramipexole 0.125 MG tablet  Commonly known as:  MIRAPEX  Take 0.125-0.375 mg by mouth 2 (two) times daily as needed (for leg pain).  PRESCRIPTION MEDICATION  Place 1 suppository vaginally daily as needed (for yeast infections). Boric Acid Suppository     QUEtiapine 100 MG tablet  Commonly known as:  SEROQUEL  Take 100 mg by mouth daily. May take extra dose at bedtime     rosuvastatin 40 MG tablet  Commonly known as:  CRESTOR  Take 40 mg by mouth every morning.     triamcinolone 0.1 % paste  Commonly known as:  KENALOG  Place 1 application onto teeth as needed (for mouth ulcers).      ASK your doctor about these medications       PHENobarbital 64.8 MG tablet  Commonly known as:  LUMINAL  Take 64.8 mg by mouth at bedtime.          Follow-up: Dr. Jaymes Graff,  July 11, 2013 at 10:45 a.m.   SignedHenreitta Leber, PA-C 05/31/2013, 7:54 AM

## 2013-05-31 NOTE — Progress Notes (Signed)
Pt seen and examined.  She desires to go home will discharge home.  I asked her about the bruises on her legs and she is not sure where she got them from.  She stated she bruises easily.

## 2013-06-30 ENCOUNTER — Other Ambulatory Visit: Payer: Self-pay | Admitting: Neurology

## 2013-06-30 NOTE — Telephone Encounter (Signed)
Rx signed and faxed.

## 2013-08-04 ENCOUNTER — Telehealth: Payer: Self-pay | Admitting: Neurology

## 2013-08-07 ENCOUNTER — Telehealth: Payer: Self-pay | Admitting: Diagnostic Neuroimaging

## 2013-08-07 NOTE — Telephone Encounter (Signed)
Patient called stating that her bipolar disorder is out of control and that she is arguing with her family. Dr. Anne Hahn has been managing this, although he recommended psychiatry evaluation.   She feels "out of control". I told her if she feels that she is a danger to herself or others, that she needs to go to the hospital for psychiatry admission. I asked to speak with her family. Patient declined, and then hung up phone on me. I called back several times, but she did not answer.  She declines psychiatry evaluation as well. I will forward to Dr. Anne Hahn, her neurologist, and her PCP.  Suanne Marker, MD 08/07/2013, 10:38 PM Certified in Neurology, Neurophysiology and Neuroimaging  [facility] [address] [place], [place] [address] [phone]

## 2013-08-08 NOTE — Telephone Encounter (Signed)
I called patient. I talked with her mother. The mother indicates that the patient does not wish to talk at this time. Clearly, the patient will require some sort of psychiatric evaluation and followup. The patient has frequent episodes of "nervous breakdowns".

## 2013-09-04 ENCOUNTER — Other Ambulatory Visit: Payer: Self-pay | Admitting: Neurology

## 2013-09-19 ENCOUNTER — Telehealth: Payer: Self-pay | Admitting: Neurology

## 2013-09-20 MED ORDER — ESCITALOPRAM OXALATE 10 MG PO TABS: 10.0000 mg | ORAL_TABLET | Freq: Every day | ORAL | Status: DC

## 2013-09-20 NOTE — Telephone Encounter (Signed)
Rx was already sent on 11/17.  I have resent the Rx.

## 2013-09-25 ENCOUNTER — Encounter: Payer: Self-pay | Admitting: Neurology

## 2013-09-25 ENCOUNTER — Encounter (INDEPENDENT_AMBULATORY_CARE_PROVIDER_SITE_OTHER): Payer: Self-pay

## 2013-09-25 ENCOUNTER — Ambulatory Visit (INDEPENDENT_AMBULATORY_CARE_PROVIDER_SITE_OTHER): Payer: Medicare Other | Admitting: Neurology

## 2013-09-25 VITALS — BP 108/69 | HR 74 | Wt 200.0 lb

## 2013-09-25 DIAGNOSIS — R51 Headache: Secondary | ICD-10-CM

## 2013-09-25 DIAGNOSIS — Z8669 Personal history of other diseases of the nervous system and sense organs: Secondary | ICD-10-CM

## 2013-09-25 DIAGNOSIS — F319 Bipolar disorder, unspecified: Secondary | ICD-10-CM

## 2013-09-25 DIAGNOSIS — Z5181 Encounter for therapeutic drug level monitoring: Secondary | ICD-10-CM

## 2013-09-25 MED ORDER — DIVALPROEX SODIUM 500 MG PO DR TAB: 500.0000 mg | DELAYED_RELEASE_TABLET | Freq: Two times a day (BID) | ORAL | Status: DC

## 2013-09-25 NOTE — Patient Instructions (Signed)
Bipolar Disorder °Bipolar disorder is a mental illness. The term bipolar disorder actually is used to describe a group of disorders that all share varying degrees of emotional highs and lows that can interfere with daily functioning, such as work, school, or relationships. Bipolar disorder also can lead to drug abuse, hospitalization, and suicide. °The emotional highs of bipolar disorder are periods of elation or irritability and high energy. These highs can range from a mild form (hypomania) to a severe form (mania). People experiencing episodes of hypomania may appear energetic, excitable, and highly productive. People experiencing mania may behave impulsively or erratically. They often make poor decisions. They may have difficulty sleeping. The most severe episodes of mania can involve having very distorted beliefs or perceptions about the world and seeing or hearing things that are not real (psychotic delusions and hallucinations).  °The emotional lows of bipolar disorder (depression) also can range from mild to severe. Severe episodes of bipolar depression can involve psychotic delusions and hallucinations. °Sometimes people with bipolar disorder experience a state of mixed mood. Symptoms of hypomania or mania and depression are both present during this mixed-mood episode. °SIGNS AND SYMPTOMS °There are signs and symptoms of the episodes of hypomania and mania as well as the episodes of depression. The signs and symptoms of hypomania and mania are similar but vary in severity. They include: °· Inflated self-esteem or feeling of increased self-confidence. °· Decreased need for sleep. °· Unusual talkativeness (rapid or pressured speech) or the feeling of a need to keep talking. °· Sensation of racing thoughts or constant talking, with quick shifts between topics that may or may not be related (flight of ideas). °· Decreased ability to focus or concentrate. °· Increased purposeful activity, such as work, studies,  or social activity, or nonproductive activity, such as pacing, squirming and fidgeting, or finger and toe tapping. °· Impulsive behavior and use of poor judgment, resulting in high-risk activities, such as having unprotected sex or spending excessive amounts of money. °Signs and symptoms of depression include the following:  °· Feelings of sadness, hopelessness, or helplessness. °· Frequent or uncontrollable episodes of crying. °· Lack of feeling anything or caring about anything. °· Difficulty sleeping or sleeping too much.  °· Inability to enjoy the things you used to enjoy.   °· Desire to be alone all the time.   °· Feelings of guilt or worthlessness.  °· Lack of energy or motivation.   °· Difficulty concentrating, remembering, or making decisions.  °· Change in appetite or weight beyond normal fluctuations. °· Thoughts of death or the desire to harm yourself. °DIAGNOSIS  °Bipolar disorder is diagnosed through an assessment by your caregiver. Your caregiver will ask questions about your emotional episodes. There are two main types of bipolar disorder. People with type I bipolar disorder have manic episodes with or without depressive episodes. People with type II bipolar disorder have hypomanic episodes and major depressive episodes, which are more serious than mild depression. The type of bipolar disorder you have can make an important difference in how your illness is monitored and treated. °Your caregiver may ask questions about your medical history and use of alcohol or drugs, including prescription medication. Certain medical conditions and substances also can cause emotional highs and lows that resemble bipolar disorder (secondary bipolar disorder).  °TREATMENT  °Bipolar disorder is a long-term illness. It is best controlled with continuous treatment rather than treatment only when symptoms occur. The following treatments can be prescribed for bipolar disorders: °· Medication Medication can be prescribed by  a doctor   that is an expert in treating mental disorders (psychiatrists). Medications called mood stabilizers are usually prescribed to help control the illness. Other medications are sometimes added if symptoms of mania, depression, or psychotic delusions and hallucinations occur despite the use of a mood stabilizer. °· Talk therapy Some forms of talk therapy are helpful in providing support, education, and guidance. °A combination of medication and talk therapy is best for managing the disorder over time. A procedure in which electricity is applied to your brain through your scalp (electroconvulsive therapy) is used in cases of severe mania when medication and talk therapy do not work or work too slowly. °Document Released: 01/11/2001 Document Revised: 01/30/2013 Document Reviewed: 10/31/2012 °[employer]® Patient Information ©2014 [employer]. ° °

## 2013-09-25 NOTE — Progress Notes (Signed)
Reason for visit: Seizures  Teresa Lawrence is an 47 y.o. female  History of present illness:  Teresa Lawrence is a 47 year old right-handed white female with a history of a bipolar disorder, headaches, panic disorder, and seizures. The patient mainly complains of psychiatric issues associated with rapid mood swings, panic attacks, and pressure sensations in the head. The patient may have dizziness at times. The patient denies any recent seizures. The patient has not been followed by a psychiatrist, and she indicates that in the past, the psychiatry therapies made her worse. The patient returns to this office for an evaluation. The patient is on Seroquel which seems to help some. The patient denies any other new medical issues that have come up since last seen.  Past Medical History  Diagnosis Date  . Vaginal yeast infection 2007    recurrent   . PMS (premenstrual syndrome)   . Dysmenorrhea   . Yeast vaginitis   . Dysuria   . High blood cholesterol   . Perimenopausal vasomotor symptoms   . HSV-2 infection   . Anxiety   . Bipolar disorder   . Menorrhagia   . Mass of ovary     left side  . Uterine mass   . Hx of ovarian cyst   . Hx of seizure disorder   . Dysplasia   . Abnormal Pap smear   . Hx of hypercholesterolemia   . PMS (premenstrual syndrome)   . PMS (premenstrual syndrome)   . Headache(784.0)   . RLS (restless legs syndrome)   . Mild mental retardation   . Seizures   . Dysrhythmia     episodes of palpitations  . H/O head injury   . Mental retardation     mild per Dr. Hattie Perch in [facility]    Past Surgical History  Procedure Laterality Date  . Hysteroscopy w/ endometrial ablation    . Dilation and curettage of uterus    . Polypectomy    . Colposcopy    . Endometrial biopsy    . Wisdom tooth extraction    . Laparoscopic hysterectomy Right 05/30/2013    Procedure: HYSTERECTOMY TOTAL LAPAROSCOPIC;  Surgeon: Michael Litter, MD;  Location: [facility];  Service: Gynecology;   Laterality: Right;  Right Salpingectomy  . Cystoscopy Bilateral 05/30/2013    Procedure: CYSTOSCOPY;  Surgeon: Michael Litter, MD;  Location: [facility];  Service: Gynecology;  Laterality: Bilateral;    Family History  Problem Relation Age of Onset  . Heart attack Father     Social history:  reports that she has quit smoking. She has never used smokeless tobacco. She reports that she does not drink alcohol or use illicit drugs.    Allergies  Allergen Reactions  . Requip [Ropinirole Hcl] Nausea And Vomiting    Medications:  Current Outpatient Prescriptions on File Prior to Visit  Medication Sig Dispense Refill  . acyclovir (ZOVIRAX) 200 MG capsule Take 200 mg by mouth 3 (three) times daily as needed (fever blisters).       Marland Kitchen albuterol (PROVENTIL HFA;VENTOLIN HFA) 108 (90 BASE) MCG/ACT inhaler Inhale 2 puffs into the lungs every 6 (six) hours as needed for wheezing.      . clonazePAM (KLONOPIN) 2 MG tablet TAKE ONE TABLET BY MOUTH TWICE DAILY AND TWO AT BEDTIME  120 tablet  3  . diazepam (VALIUM) 5 MG tablet Take 5 mg by mouth daily as needed for anxiety (mood swings).      . DIPHENHYDRAMINE HCL, SLEEP,  PO Take 1 tablet by mouth at bedtime as needed (for sleep).      Marland Kitchen escitalopram (LEXAPRO) 10 MG tablet Take 1 tablet (10 mg total) by mouth daily.  30 tablet  0  . fluticasone (FLOVENT HFA) 44 MCG/ACT inhaler Inhale 1-4 puffs into the lungs 2 (two) times daily as needed (shortness of breath).      . hydrocortisone cream 1 % Apply 1 application topically as needed (itching).      Marland Kitchen ibuprofen (ADVIL,MOTRIN) 600 MG tablet Take 1 tablet (600 mg total) by mouth every 6 (six) hours as needed (mild pain).  30 tablet  1  . loperamide (IMODIUM) 2 MG capsule Take 2 mg by mouth 4 (four) times daily as needed for diarrhea or loose stools.      . Multiple Vitamins-Minerals (MULTIVITAMIN WITH MINERALS) tablet Take 1 tablet by mouth daily.      Marland Kitchen neomycin-bacitracin-polymyxin (NEOSPORIN) [phone]  ointment Apply 1 application topically as needed (for cuts).      Marland Kitchen omeprazole (PRILOSEC) 20 MG capsule Take 20 mg by mouth 2 (two) times daily as needed (for reflux).       Marland Kitchen PHENobarbital (LUMINAL) 64.8 MG tablet Take 64.8 mg by mouth at bedtime.      . pramipexole (MIRAPEX) 0.125 MG tablet Take 0.125-0.375 mg by mouth 2 (two) times daily as needed (for leg pain).      Marland Kitchen PRESCRIPTION MEDICATION Place 1 suppository vaginally daily as needed (for yeast infections). Boric Acid Suppository      . QUEtiapine (SEROQUEL) 100 MG tablet Take 100 mg by mouth daily. May take extra dose at bedtime      . rosuvastatin (CRESTOR) 40 MG tablet Take 40 mg by mouth every morning.      . triamcinolone (KENALOG) 0.1 % paste Place 1 application onto teeth as needed (for mouth ulcers).        No current facility-administered medications on file prior to visit.    ROS:  Out of a complete 14 system review of symptoms, the patient complains only of the following symptoms, and all other reviewed systems are negative.  Fatigue Eye itching Eye redness Leg swelling Excessive thirst Restless legs, insomnia, frequent walking Memory loss, dizziness, headache, numbness, speech problems Agitation, behavior problems  Blood pressure 108/69, pulse 74, weight 200 lb (90.719 kg).  Physical Exam  General: The patient is alert and cooperative at the time of the examination. The patient is moderately obese.  Skin: No significant peripheral edema is noted.   Neurologic Exam  Mental status: The patient is oriented x 3.  Cranial nerves: Facial symmetry is present. Speech is normal, no aphasia or dysarthria is noted. Extraocular movements are full. Visual fields are full.  Motor: The patient has good strength in all 4 extremities.  Sensory examination: Soft touch sensation on the face, arms, and legs is symmetric.  Coordination: The patient has good finger-nose-finger and heel-to-shin bilaterally.  Gait and  station: The patient has a normal gait. Tandem gait is normal. Romberg is negative. No drift is seen.  Reflexes: Deep tendon reflexes are symmetric.   Assessment/Plan:  One. Bipolar disorder  2. History seizures  3. History of headache  The patient will be placed on Depakote, as this helped her previously. The patient made gains benefit as well with the headaches and seizures. The patient will have blood work done today. The patient will followup through this office in about 6 months.  Marlan Palau MD 09/25/2013 12:52 PM  [facility] [address] [place], [place] [address]  Phone [phone] Fax [phone]

## 2013-09-26 LAB — COMPREHENSIVE METABOLIC PANEL
AST: 15 IU/L (ref 0–40)
Albumin/Globulin Ratio: 1.7 (ref 1.1–2.5)
BUN/Creatinine Ratio: 14 (ref 9–23)
Calcium: 9.4 mg/dL (ref 8.7–10.2)
Creatinine, Ser: 0.57 mg/dL (ref 0.57–1.00)
GFR calc Af Amer: 129 mL/min/{1.73_m2} (ref >59)
GFR calc non Af Amer: 112 mL/min/{1.73_m2} (ref >59)
Globulin, Total: 2.5 g/dL (ref 1.5–4.5)
Sodium: 143 mmol/L (ref 134–144)

## 2013-09-26 LAB — CBC WITH DIFFERENTIAL
Basos: 0 %
Eosinophils Absolute: 0.2 10*3/uL (ref 0.0–0.4)
Hemoglobin: 13.2 g/dL (ref 11.1–15.9)
Immature Grans (Abs): 0 10*3/uL (ref 0.0–0.1)
MCH: 31 pg (ref 26.6–33.0)
MCHC: 34.6 g/dL (ref 31.5–35.7)
Monocytes Absolute: 0.6 10*3/uL (ref 0.1–0.9)
Neutrophils Absolute: 4.4 10*3/uL (ref 1.4–7.0)
Neutrophils Relative %: 63 %

## 2013-10-04 ENCOUNTER — Telehealth: Payer: Self-pay | Admitting: Neurology

## 2013-10-04 NOTE — Telephone Encounter (Signed)
Spoke with dauthter and she said that her symptoms are worse since office visit. The depracote is helping with the mood swings. She is requesting MRI or CT scan  because something seems to be going on, is nauseated everyday.

## 2013-10-04 NOTE — Telephone Encounter (Signed)
I called patient. The patient complains of some nausea over the last several days, coming on within 2 days after starting the Depakote. The patient will stop the Depakote for 2 or 3 days to see if the stomach issues go away. The patient continues to complain of something "going on in her head". The patient wants a head scan, not sure that it is indicated at this time.

## 2013-11-22 ENCOUNTER — Other Ambulatory Visit: Payer: Self-pay | Admitting: Neurology

## 2013-11-23 ENCOUNTER — Other Ambulatory Visit: Payer: Self-pay | Admitting: Neurology

## 2013-11-23 NOTE — Telephone Encounter (Signed)
Rx signed and faxed.

## 2013-11-23 NOTE — Telephone Encounter (Signed)
Pt needs refill on Phenobaratol.  Please call and advise.

## 2013-12-13 ENCOUNTER — Telehealth: Payer: Self-pay | Admitting: Neurology

## 2013-12-13 NOTE — Telephone Encounter (Signed)
Patient wants to know what Escitalopram is for--

## 2013-12-13 NOTE — Telephone Encounter (Signed)
By reviewing the patients chart, the following phone note was documented: [name], MD at 05/08/2013 5:20 PM     Status: Signed        I called the patient. She is having increased problems with anxiety. I have indicated that she needs to see a psychiatrist, but she does not wish to do this at this time. The patient will be given a prescription for Lexapro. The patient wanted to go on Valium, but she is already on clonazepam.   I called the patient back.  Relayed info from note.  She then began to ask why the pharmacy called Dr Rex Kras and told him not to fill a medication he had prescribed.  I suggested she contact Dr Eddie Dibbles office to discuss this with them.  She said she did, but couldn't understand their answer, so she thought she'd ask [place].  I apologized, but told her we would not be able to assist her with this, since it is Dr Rex Kras who prescribed whatever med is in question.  I encouraged her to call them back if she had questions about a med he prescribed.

## 2013-12-28 ENCOUNTER — Other Ambulatory Visit: Payer: Self-pay | Admitting: Neurology

## 2013-12-28 NOTE — Telephone Encounter (Signed)
Phone note says:  [name], MD at 05/08/2013 5:20 PM      Status: Signed         I called the patient. She is having increased problems with anxiety. I have indicated that she needs to see a psychiatrist, but she does not wish to do this at this time. The patient will be given a prescription for Lexapro. The patient wanted to go on Valium, but she is already on clonazepam

## 2014-01-24 ENCOUNTER — Other Ambulatory Visit: Payer: Self-pay | Admitting: Neurology

## 2014-01-24 NOTE — Telephone Encounter (Signed)
Rx signed and faxed.

## 2014-03-04 ENCOUNTER — Other Ambulatory Visit: Payer: Self-pay | Admitting: Neurology

## 2014-03-05 NOTE — Telephone Encounter (Signed)
Dose per OV in May

## 2014-03-23 ENCOUNTER — Other Ambulatory Visit: Payer: Self-pay | Admitting: Neurology

## 2014-03-26 ENCOUNTER — Encounter: Payer: Self-pay | Admitting: Neurology

## 2014-03-26 ENCOUNTER — Telehealth: Payer: Self-pay | Admitting: Neurology

## 2014-03-26 ENCOUNTER — Encounter (INDEPENDENT_AMBULATORY_CARE_PROVIDER_SITE_OTHER): Payer: Self-pay

## 2014-03-26 ENCOUNTER — Ambulatory Visit (INDEPENDENT_AMBULATORY_CARE_PROVIDER_SITE_OTHER): Payer: Medicare Other | Admitting: Neurology

## 2014-03-26 VITALS — BP 123/80 | HR 78 | Wt 198.0 lb

## 2014-03-26 DIAGNOSIS — R51 Headache: Secondary | ICD-10-CM

## 2014-03-26 DIAGNOSIS — G40909 Epilepsy, unspecified, not intractable, without status epilepticus: Secondary | ICD-10-CM

## 2014-03-26 NOTE — Patient Instructions (Signed)
Epilepsy Epilepsy is a disorder in which a person has repeated seizures over time. A seizure is a release of abnormal electrical activity in the brain. Seizures can cause a change in attention, behavior, or the ability to remain awake and alert (altered mental status). Seizures often involve uncontrollable shaking (convulsions).  Most people with epilepsy lead normal lives. However, people with epilepsy are at an increased risk of falls, accidents, and injuries. Therefore, it is important to begin treatment right away. CAUSES  Epilepsy has many possible causes. Anything that disturbs the normal pattern of brain cell activity can lead to seizures. This may include:   Head injury.  Birth trauma.  High fever as a child.  Stroke.  Bleeding into or around the brain.  Certain drugs.  Prolonged low oxygen, such as what occurs after CPR efforts.  Abnormal brain development.  Certain illnesses, such as meningitis, encephalitis (brain infection), malaria, and other infections.  An imbalance of nerve signaling chemicals (neurotransmitters).  SIGNS AND SYMPTOMS  The symptoms of a seizure can vary greatly from one person to another. Right before a seizure, you may have a warning (aura) that a seizure is about to occur. An aura may include the following symptoms:  Fear or anxiety.  Nausea.  Feeling like the room is spinning (vertigo).  Vision changes, such as seeing flashing lights or spots. Common symptoms during a seizure include:  Abnormal sensations, such as an abnormal smell or a bitter taste in the mouth.   Sudden, general body stiffness.   Convulsions that involve rhythmic jerking of the face, arm, or leg on one or both sides.   Sudden change in consciousness.   Appearing to be awake but not responding.   Appearing to be asleep but cannot be awakened.   Grimacing, chewing, lip smacking, drooling, tongue biting, or loss of bowel or bladder control. After a seizure,  you may feel sleepy for a while. DIAGNOSIS  Your health care provider will ask about your symptoms and take a medical history. Descriptions from any witnesses to your seizures will be very helpful in the diagnosis. A physical exam, including a detailed neurological exam, is necessary. Various tests may be done, such as:   An electroencephalogram (EEG). This is a painless test of your brain waves. In this test, a diagram is created of your brain waves. These diagrams can be interpreted by a specialist.  An MRI of the brain.   A CT scan of the brain.   A spinal tap (lumbar puncture, LP).  Blood tests to check for signs of infection or abnormal blood chemistry. TREATMENT  There is no cure for epilepsy, but it is generally treatable. Once epilepsy is diagnosed, it is important to begin treatment as soon as possible. For most people with epilepsy, seizures can be controlled with medicines. The following may also be used:  A pacemaker for the brain (vagus nerve stimulator) can be used for people with seizures that are not well controlled by medicine.  Surgery on the brain. For some people, epilepsy eventually goes away. HOME CARE INSTRUCTIONS   Follow your health care provider's recommendations on driving and safety in normal activities.  Get enough rest. Lack of sleep can cause seizures.  Only take over-the-counter or prescription medicines as directed by your health care provider. Take any prescribed medicine exactly as directed.  Avoid any known triggers of your seizures.  Keep a seizure diary. Record what you recall about any seizure, especially any possible trigger.   Make   sure the people you live and work with know that you are prone to seizures. They should receive instructions on how to help you. In general, a witness to a seizure should:   Cushion your head and body.   Turn you on your side.   Avoid unnecessarily restraining you.   Not place anything inside your  mouth.   Call for emergency medical help if there is any question about what has occurred.   Follow up with your health care provider as directed. You may need regular blood tests to monitor the levels of your medicine.  SEEK MEDICAL CARE IF:   You develop signs of infection or other illness. This might increase the risk of a seizure.   You seem to be having more frequent seizures.   Your seizure pattern is changing.  SEEK IMMEDIATE MEDICAL CARE IF:   You have a seizure that does not stop after a few moments.   You have a seizure that causes any difficulty in breathing.   You have a seizure that results in a very severe headache.   You have a seizure that leaves you with the inability to speak or use a part of your body.  Document Released: 10/05/2005 Document Revised: 07/26/2013 Document Reviewed: 05/17/2013 [employer] Patient Information 2014 [employer].  

## 2014-03-26 NOTE — Progress Notes (Signed)
Reason for visit: Seizures  Teresa Lawrence is an 48 y.o. female  History of present illness:  Teresa Lawrence is a 48 year old right-handed white female with a history of bipolar disorder and a history of seizures. She has done quite well with her seizure events. The patient is on phenobarbital currently. The patient has continued to have a lot of anxiety issues and mood swings. She recently was placed on Abilify which seems to have helped initially. The patient has restless leg syndrome and she is on Mirapex for this. She returns to this office for further evaluation. The patient overall is doing fairly well. No new significant medical issues have come up since last seen.  Past Medical History  Diagnosis Date  . Vaginal yeast infection 2007    recurrent   . PMS (premenstrual syndrome)   . Dysmenorrhea   . Yeast vaginitis   . Dysuria   . High blood cholesterol   . Perimenopausal vasomotor symptoms   . HSV-2 infection   . Anxiety   . Bipolar disorder   . Menorrhagia   . Mass of ovary     left side  . Uterine mass   . Hx of ovarian cyst   . Hx of seizure disorder   . Dysplasia   . Abnormal Pap smear   . Hx of hypercholesterolemia   . PMS (premenstrual syndrome)   . PMS (premenstrual syndrome)   . Headache(784.0)   . RLS (restless legs syndrome)   . Mild mental retardation   . Seizures   . Dysrhythmia     episodes of palpitations  . H/O head injury   . Mental retardation     mild per Dr. Archie Endo in [facility]    Past Surgical History  Procedure Laterality Date  . Hysteroscopy w/ endometrial ablation    . Dilation and curettage of uterus    . Polypectomy    . Colposcopy    . Endometrial biopsy    . Wisdom tooth extraction    . Laparoscopic hysterectomy Right 05/30/2013    Procedure: HYSTERECTOMY TOTAL LAPAROSCOPIC;  Surgeon: Betsy Coder, MD;  Location: [facility];  Service: Gynecology;  Laterality: Right;  Right Salpingectomy  . Cystoscopy Bilateral 05/30/2013    Procedure:  CYSTOSCOPY;  Surgeon: Betsy Coder, MD;  Location: [facility];  Service: Gynecology;  Laterality: Bilateral;    Family History  Problem Relation Age of Onset  . Heart attack Father     Social history:  reports that she has quit smoking. She has never used smokeless tobacco. She reports that she does not drink alcohol or use illicit drugs.    Allergies  Allergen Reactions  . Requip [Ropinirole Hcl] Nausea And Vomiting    Medications:  Current Outpatient Prescriptions on File Prior to Visit  Medication Sig Dispense Refill  . acyclovir (ZOVIRAX) 200 MG capsule Take 200 mg by mouth 3 (three) times daily as needed (fever blisters).       . clonazePAM (KLONOPIN) 2 MG tablet TAKE ONE TABLET BY MOUTH TWICE DAILY AND TWO ONCE DAILY AT BEDTIME  120 tablet  5  . DIPHENHYDRAMINE HCL, SLEEP, PO Take 1 tablet by mouth at bedtime as needed (for sleep).      Marland Kitchen escitalopram (LEXAPRO) 10 MG tablet TAKE ONE TABLET BY MOUTH ONCE DAILY  30 tablet  6  . furosemide (LASIX) 40 MG tablet Take 40 mg by mouth daily as needed.      . hydrocortisone cream 1 %  Apply 1 application topically as needed (itching).      Marland Kitchen ibuprofen (ADVIL,MOTRIN) 600 MG tablet Take 1 tablet (600 mg total) by mouth every 6 (six) hours as needed (mild pain).  30 tablet  1  . loperamide (IMODIUM) 2 MG capsule Take 2 mg by mouth 4 (four) times daily as needed for diarrhea or loose stools.      . Multiple Vitamins-Minerals (MULTIVITAMIN WITH MINERALS) tablet Take 1 tablet by mouth daily.      Marland Kitchen neomycin-bacitracin-polymyxin (NEOSPORIN) [phone] ointment Apply 1 application topically as needed (for cuts).      Marland Kitchen omeprazole (PRILOSEC) 20 MG capsule Take 20 mg by mouth 2 (two) times daily as needed (for reflux).       Marland Kitchen PHENobarbital (LUMINAL) 64.8 MG tablet TAKE ONE TABLET BY MOUTH TWICE DAILY  60 tablet  5  . pramipexole (MIRAPEX) 0.25 MG tablet TAKE ONE TABLET BY MOUTH THREE TIMES DAILY  90 tablet  6  . PRESCRIPTION MEDICATION Place 1  suppository vaginally daily as needed (for yeast infections). Boric Acid Suppository      . promethazine (PHENERGAN) 25 MG tablet Take 25 mg by mouth every 6 (six) hours as needed for nausea or vomiting.      Marland Kitchen QUEtiapine (SEROQUEL) 100 MG tablet Take 100 mg by mouth daily. May take extra dose at bedtime      . SF 1.1 % GEL dental gel Place 1.1 mLs onto teeth.      . triamcinolone (KENALOG) 0.1 % paste Place 1 application onto teeth as needed (for mouth ulcers).        No current facility-administered medications on file prior to visit.    ROS:  Out of a complete 14 system review of symptoms, the patient complains only of the following symptoms, and all other reviewed systems are negative.  Fatigue Eye itching Excessive thirst Restless legs, insomnia, frequent waking, daytime sleepiness Joint pain, muscle cramps Itching Headache, numbness, speech difficulties Agitation, behavior problems, confusion, depression, anxiety, hyperactive  Blood pressure 123/80, pulse 78, weight 198 lb (89.812 kg).  Physical Exam  General: The patient is alert and cooperative at the time of the examination. The patient is moderately to markedly obese.  Skin: No significant peripheral edema is noted.   Neurologic Exam  Mental status: The patient is oriented x 3.  Cranial nerves: Facial symmetry is present. Speech is normal, no aphasia or dysarthria is noted. Extraocular movements are full. Visual fields are full.  Motor: The patient has good strength in all 4 extremities.  Sensory examination: Soft touch sensation is symmetric on the face, arms, and legs.  Coordination: The patient has good finger-nose-finger and heel-to-shin bilaterally.  Gait and station: The patient has a normal gait. Tandem gait is normal. Romberg is negative. No drift is seen.  Reflexes: Deep tendon reflexes are symmetric.   Assessment/Plan:  1. History seizures  2. Bipolar disorder  3. History of headache  The  patient continues to have a lot of mood swings and anxiety issues. She is doing well with her seizures. We will continue her current medications, and she will followup in 6-8 months. She had work done in late December 2014 with a normal conference of metabolic profile, CBC and a good phenobarbital level.  Jill Alexanders MD 03/26/2014 8:52 PM  [facility] [address] [place], [place] [address]  Phone [phone] Fax [phone]

## 2014-03-26 NOTE — Telephone Encounter (Signed)
The patient called. She is having another headache. It has a pressure feeling around the head, and she has sharp pains in the back of the head. She is to take her PM medications and some tylenol and try to get to sleep. If the headache persists, she is to call our office in the AM and we will get her in for an infusion.

## 2014-04-26 ENCOUNTER — Telehealth: Payer: Self-pay

## 2014-04-26 NOTE — Telephone Encounter (Signed)
I called back and spoke with the patient.  She said she will call Dr Rex Kras to request refills.

## 2014-04-26 NOTE — Telephone Encounter (Signed)
Patient would like to know if we will fill Temazepam for her.  Please advise.  Thank you.

## 2014-04-26 NOTE — Telephone Encounter (Signed)
I do not wish to refill this medication if we have never given it to her before. She needs to call the prescribing doctor.

## 2014-04-30 ENCOUNTER — Other Ambulatory Visit: Payer: Self-pay

## 2014-04-30 DIAGNOSIS — Z1231 Encounter for screening mammogram for malignant neoplasm of breast: Secondary | ICD-10-CM

## 2014-05-03 ENCOUNTER — Encounter (HOSPITAL_COMMUNITY): Payer: Self-pay | Admitting: Emergency Medicine

## 2014-05-03 ENCOUNTER — Emergency Department (HOSPITAL_COMMUNITY)
Admission: EM | Admit: 2014-05-03 | Discharge: 2014-05-03 | Disposition: A | Discharge status: Home / Self Care | Payer: Medicare Other | Attending: Emergency Medicine | Admitting: Emergency Medicine

## 2014-05-03 ENCOUNTER — Telehealth: Payer: Self-pay | Admitting: Neurology

## 2014-05-03 DIAGNOSIS — IMO0002 Reserved for concepts with insufficient information to code with codable children: Secondary | ICD-10-CM | POA: Diagnosis not present

## 2014-05-03 DIAGNOSIS — Y9289 Other specified places as the place of occurrence of the external cause: Secondary | ICD-10-CM | POA: Insufficient documentation

## 2014-05-03 DIAGNOSIS — Z23 Encounter for immunization: Secondary | ICD-10-CM | POA: Diagnosis not present

## 2014-05-03 DIAGNOSIS — F411 Generalized anxiety disorder: Secondary | ICD-10-CM | POA: Diagnosis not present

## 2014-05-03 DIAGNOSIS — Z8669 Personal history of other diseases of the nervous system and sense organs: Secondary | ICD-10-CM | POA: Diagnosis not present

## 2014-05-03 DIAGNOSIS — Z8619 Personal history of other infectious and parasitic diseases: Secondary | ICD-10-CM | POA: Insufficient documentation

## 2014-05-03 DIAGNOSIS — Y9389 Activity, other specified: Secondary | ICD-10-CM | POA: Insufficient documentation

## 2014-05-03 DIAGNOSIS — Z79899 Other long term (current) drug therapy: Secondary | ICD-10-CM | POA: Insufficient documentation

## 2014-05-03 DIAGNOSIS — S61209A Unspecified open wound of unspecified finger without damage to nail, initial encounter: Secondary | ICD-10-CM | POA: Insufficient documentation

## 2014-05-03 DIAGNOSIS — Z862 Personal history of diseases of the blood and blood-forming organs and certain disorders involving the immune mechanism: Secondary | ICD-10-CM | POA: Diagnosis not present

## 2014-05-03 DIAGNOSIS — F319 Bipolar disorder, unspecified: Secondary | ICD-10-CM

## 2014-05-03 DIAGNOSIS — Z8639 Personal history of other endocrine, nutritional and metabolic disease: Secondary | ICD-10-CM | POA: Insufficient documentation

## 2014-05-03 DIAGNOSIS — Z87828 Personal history of other (healed) physical injury and trauma: Secondary | ICD-10-CM | POA: Diagnosis not present

## 2014-05-03 DIAGNOSIS — Z87891 Personal history of nicotine dependence: Secondary | ICD-10-CM | POA: Insufficient documentation

## 2014-05-03 DIAGNOSIS — S61210A Laceration without foreign body of right index finger without damage to nail, initial encounter: Secondary | ICD-10-CM

## 2014-05-03 MED ORDER — TETANUS-DIPHTH-ACELL PERTUSSIS 5-2.5-18.5 LF-MCG/0.5 IM SUSP
0.5000 mL | Freq: Once | INTRAMUSCULAR | Status: AC
  Administered 2014-05-03: 0.5 mL via INTRAMUSCULAR
  Filled 2014-05-03: qty 0.5

## 2014-05-03 NOTE — Telephone Encounter (Signed)
I called patient. She once again is calling concerning her psychiatric illness. She is having mood swings. She also has a lot of panic issues. She indicates that she is not being followed by a psychiatrist. I will try to make a referral. The patient has Medicare and Medicaid, hopefully she can somehow access psychiatric care.

## 2014-05-03 NOTE — Telephone Encounter (Signed)
left Vm message for return call back concerning patient's mood swings

## 2014-05-03 NOTE — ED Provider Notes (Signed)
CSN: [id number]     Arrival date & time 05/03/14  1107 History  This chart was scribed for non-physician practitioner Noland Fordyce, working with No att. providers found by Donato Schultz, ED Scribe. This patient was seen in room TR05C/TR05C and the patient's care was started at 11:52 AM.    Chief Complaint  Patient presents with  . Laceration    Patient is a 48 y.o. female presenting with skin laceration. The history is provided by a parent and the patient. No language interpreter was used.  Laceration  HPI Comments: Teresa Lawrence is a 48 y.o. female who presents to the Emergency Department complaining of a laceration to her right index finger that she incurred at 9:30 AM this morning after the patient hit her right hand on a chair frame.  Pain is constant, throbbing, 5/10, worse with movement of her right index finger. The patient was able to apply a band aid to the area PTA and control the bleeding.  She states that Dr. Tamsen Roers is her PCP.  States she thought her last tetanus was <19yrs ago but then wasn't so sure.     Past Medical History  Diagnosis Date  . Vaginal yeast infection 2007    recurrent   . PMS (premenstrual syndrome)   . Dysmenorrhea   . Yeast vaginitis   . Dysuria   . High blood cholesterol   . Perimenopausal vasomotor symptoms   . HSV-2 infection   . Anxiety   . Bipolar disorder   . Menorrhagia   . Mass of ovary     left side  . Uterine mass   . Hx of ovarian cyst   . Hx of seizure disorder   . Dysplasia   . Abnormal Pap smear   . Hx of hypercholesterolemia   . PMS (premenstrual syndrome)   . PMS (premenstrual syndrome)   . Headache(784.0)   . RLS (restless legs syndrome)   . Mild mental retardation   . Seizures   . Dysrhythmia     episodes of palpitations  . H/O head injury   . Mental retardation     mild per Dr. Archie Endo in [facility]   Past Surgical History  Procedure Laterality Date  . Hysteroscopy w/ endometrial ablation    . Dilation and  curettage of uterus    . Polypectomy    . Colposcopy    . Endometrial biopsy    . Wisdom tooth extraction    . Laparoscopic hysterectomy Right 05/30/2013    Procedure: HYSTERECTOMY TOTAL LAPAROSCOPIC;  Surgeon: Betsy Coder, MD;  Location: [facility];  Service: Gynecology;  Laterality: Right;  Right Salpingectomy  . Cystoscopy Bilateral 05/30/2013    Procedure: CYSTOSCOPY;  Surgeon: Betsy Coder, MD;  Location: [facility];  Service: Gynecology;  Laterality: Bilateral;  . Abdominal hysterectomy  2013   Family History  Problem Relation Age of Onset  . Heart attack Father    History  Substance Use Topics  . Smoking status: Former Research scientist (life sciences)  . Smokeless tobacco: Never Used  . Alcohol Use: No   OB History   Grav Para Term Preterm Abortions TAB SAB Ect Mult Living                 Review of Systems  Skin: Positive for wound.  Hematological: Does not bruise/bleed easily.  All other systems reviewed and are negative.     Allergies  Requip  Home Medications   Prior to Admission medications   Medication  Sig Start Date End Date Taking? Authorizing Provider  acyclovir (ZOVIRAX) 200 MG capsule Take 200 mg by mouth 3 (three) times daily as needed (fever blisters).     Historical Provider, MD  ARIPiprazole (ABILIFY) 5 MG tablet Take 5 mg by mouth daily. 03/19/14   Historical Provider, MD  atorvastatin (LIPITOR) 80 MG tablet Take 80 mg by mouth daily. 03/08/14   Historical Provider, MD  clonazePAM (KLONOPIN) 2 MG tablet TAKE ONE TABLET BY MOUTH TWICE DAILY AND TWO ONCE DAILY AT BEDTIME 01/24/14   [name], MD  DIPHENHYDRAMINE HCL, SLEEP, PO Take 1 tablet by mouth at bedtime as needed (for sleep).    Historical Provider, MD  escitalopram (LEXAPRO) 10 MG tablet TAKE ONE TABLET BY MOUTH ONCE DAILY 12/28/13   [name], MD  furosemide (LASIX) 40 MG tablet Take 40 mg by mouth daily as needed. 07/24/13   Historical Provider, MD  hydrocortisone cream 1 % Apply 1 application topically as  needed (itching).    Historical Provider, MD  ibuprofen (ADVIL,MOTRIN) 600 MG tablet Take 1 tablet (600 mg total) by mouth every 6 (six) hours as needed (mild pain). 05/31/13   Earnstine Regal, PA-C  loperamide (IMODIUM) 2 MG capsule Take 2 mg by mouth 4 (four) times daily as needed for diarrhea or loose stools.    Historical Provider, MD  Multiple Vitamins-Minerals (MULTIVITAMIN WITH MINERALS) tablet Take 1 tablet by mouth daily.    Historical Provider, MD  neomycin-bacitracin-polymyxin (NEOSPORIN) [phone] ointment Apply 1 application topically as needed (for cuts).    Historical Provider, MD  omeprazole (PRILOSEC) 20 MG capsule Take 20 mg by mouth 2 (two) times daily as needed (for reflux).     Historical Provider, MD  PHENobarbital (LUMINAL) 64.8 MG tablet TAKE ONE TABLET BY MOUTH TWICE DAILY 11/22/13   [name], MD  pramipexole (MIRAPEX) 0.25 MG tablet TAKE ONE TABLET BY MOUTH THREE TIMES DAILY    [name], MD  PRESCRIPTION MEDICATION Place 1 suppository vaginally daily as needed (for yeast infections). Boric Acid Suppository    Historical Provider, MD  promethazine (PHENERGAN) 25 MG tablet Take 25 mg by mouth every 6 (six) hours as needed for nausea or vomiting.    Historical Provider, MD  QUEtiapine (SEROQUEL) 100 MG tablet Take 100 mg by mouth daily. May take extra dose at bedtime    Historical Provider, MD  SF 1.1 % GEL dental gel Place 1.1 mLs onto teeth. 08/31/13   Historical Provider, MD  temazepam (RESTORIL) 30 MG capsule Take 30 mg by mouth daily. 03/20/14   Historical Provider, MD  triamcinolone (KENALOG) 0.1 % paste Place 1 application onto teeth as needed (for mouth ulcers).  03/02/13   Historical Provider, MD   Triage Vitals: BP 123/73  Pulse 73  Temp(Src) 98.4 F (36.9 C) (Oral)  Resp 20  Ht 5\' 4"  (1.626 m)  Wt 195 lb (88.451 kg)  BMI 33.46 kg/m2  SpO2 100%  LMP 05/11/2013  Physical Exam  Nursing note and vitals reviewed. Constitutional: She is oriented to  person, place, and time. She appears well-developed and well-nourished.  HENT:  Head: Normocephalic and atraumatic.  Eyes: EOM are normal.  Neck: Normal range of motion.  Cardiovascular: Normal rate.   Capillary refill on right index finger is less than 2 seconds.  Pulmonary/Chest: Effort normal.  Musculoskeletal: Normal range of motion.  Full range of motion of right index finger.   Neurological: She is alert and oriented to person, place,  and time.  Sensation intact.   Skin: Skin is warm and dry.  3 cm laceration over PIP of right index finger.  No active bleeding.  No foreign bodies.  Wound does open minimally with movement of finger.  Psychiatric: She has a normal mood and affect. Her behavior is normal.    ED Course  Procedures   LACERATION REPAIR Performed by: Noland Fordyce A. Authorized by: Gwenyth Bender Consent: Verbal consent obtained. Risks and benefits: risks, benefits and alternatives were discussed Consent given by: patient Patient identity confirmed: provided demographic data Prepped and Draped in normal sterile fashion Wound explored  Laceration Location: dorsum right index finger over PIP  Laceration Length: 3cm  No Foreign Bodies seen or palpated  Anesthesia: none  Irrigation method: syringe Amount of cleaning: standard  Skin closure: simple, close  Technique: Dermabond, finger splint also placed to keep PIP from bending.   Patient tolerance: Patient tolerated the procedure well with no immediate complications.   DIAGNOSTIC STUDIES: Oxygen Saturation is 100% on room air, normal by my interpretation.    COORDINATION OF CARE: 11:54 AM- Discussed cleaning the patient's wound before applying Dermabond to the area.  Discussed discharging the patient with a splint on her right index finger and advised the patient to follow-up with her PCP.  The patient agreed to the treatment plan. 12:04 PM- Patient was unsure when her last TDAP was so will  administer a TDAP in the ED.  The patient agreed to the treatment plan.  Labs Review Labs Reviewed - No data to display  Imaging Review No results found.   EKG Interpretation None      MDM   Final diagnoses:  Laceration of right index finger w/o foreign body w/o damage to nail, initial encounter    Pt is a 48yo female presenting with laceration to dorsal aspect right index finger over PIP.  Bleeding well controlled with bandage alone.  Wound was cleaned, dermabond placed over laceration and finger splint placed to keep PIP from bending so wound can heal properly.  Advised to have wound rechecked in 3-4 days by PCP. Home care instructions provided. Return precautions provided. Pt verbalized understanding and agreement with tx plan.   I personally performed the services described in this documentation, which was scribed in my presence. The recorded information has been reviewed and is accurate.    Noland Fordyce, PA-C 05/03/14 1258

## 2014-05-03 NOTE — ED Provider Notes (Signed)
Medical screening examination/treatment/procedure(s) were performed by non-physician practitioner and as supervising physician I was immediately available for consultation/collaboration.   EKG Interpretation None        Ezequiel Essex, MD 05/03/14 1341

## 2014-05-03 NOTE — Telephone Encounter (Signed)
Patient stated her moods are over the place, crying for no reason, trying to take a chair apart and cut herself with the nail in the chair.  Concerned with her actions.  Please call and advise.

## 2014-05-03 NOTE — ED Notes (Signed)
Pt comfortable with discharge and follow up instructions. No prescriptions. 

## 2014-05-03 NOTE — ED Notes (Signed)
Pt c/o laceration to pointer finger in right hand. Pt sts she was working on a chair and thinks she hit it on the frame or possibly a nail. Pt sts she has had tetanus shot in the past 5 years. Bleeding controlled by bandage. Nad, skin warm and dry, resp e/u.

## 2014-05-07 ENCOUNTER — Telehealth: Payer: Self-pay | Admitting: Neurology

## 2014-05-07 DIAGNOSIS — F410 Panic disorder [episodic paroxysmal anxiety] without agoraphobia: Secondary | ICD-10-CM

## 2014-05-07 NOTE — Telephone Encounter (Signed)
Pt has medicare and medicaid and Dr. Toy Care does not take either one. Please advise previous note. Thanks

## 2014-05-07 NOTE — Telephone Encounter (Signed)
Called pt to inform her that the referral to see an psychiatrist has been put in and that the pt was referred to [facility], phone # [phone]. I advised the pt that if she has any other problems, questions or concerns to call the office. Pt verbalized understanding.

## 2014-05-07 NOTE — Telephone Encounter (Signed)
Patient is calling to get a referral to another psychiatrist--Dr. [facility] does not take her insurance--please call patient--thank you.

## 2014-05-07 NOTE — Telephone Encounter (Signed)
Getting a psychiatrist for this patient may be difficult, likely will need to refer her to mental health.

## 2014-05-09 ENCOUNTER — Ambulatory Visit: Payer: Medicare Other

## 2014-05-09 ENCOUNTER — Telehealth: Payer: Self-pay | Admitting: Neurology

## 2014-05-09 DIAGNOSIS — F410 Panic disorder [episodic paroxysmal anxiety] without agoraphobia: Secondary | ICD-10-CM

## 2014-05-09 NOTE — Telephone Encounter (Signed)
Patient stated Psychiatrist that we referred her to will not take her Insurance.  Please refer to another psychiatrist.  Thanks

## 2014-05-10 NOTE — Telephone Encounter (Signed)
Pt's information was given to [facility], [place]. Please advise

## 2014-05-10 NOTE — Telephone Encounter (Signed)
Notified patient that her ins is not accepted by Dr. [facility]. Her referral has been to [facility]. She has to call to make appointment.

## 2014-05-14 ENCOUNTER — Telehealth: Payer: Self-pay | Admitting: *Deleted

## 2014-05-14 NOTE — Telephone Encounter (Signed)
Patient says Dr. Letta Moynahan does not take her insurance either--please call.

## 2014-05-14 NOTE — Telephone Encounter (Signed)
The only option of someone with Medicare/Medicaid may be mental health. I will try to get something set up.

## 2014-05-14 NOTE — Addendum Note (Signed)
Addended by: Margette Fast on: 05/14/2014 02:28 PM   Modules accepted: Orders

## 2014-05-14 NOTE — Telephone Encounter (Signed)
Patient's referral has been sent to [facility] and Teresa Lawrence. Neither accept Medicare/Medicaid, who else would you like to refer to ?

## 2014-05-14 NOTE — Telephone Encounter (Signed)
Called patient and gave her [facility] contact info [phone] Mon-Fri 8-3pm. [address] [place]

## 2014-05-15 ENCOUNTER — Ambulatory Visit: Payer: Medicare Other

## 2014-05-22 ENCOUNTER — Ambulatory Visit
Admission: RE | Admit: 2014-05-22 | Discharge: 2014-05-22 | Disposition: A | Discharge status: Home / Self Care | Payer: Medicare Other | Source: Ambulatory Visit

## 2014-05-22 DIAGNOSIS — Z1231 Encounter for screening mammogram for malignant neoplasm of breast: Secondary | ICD-10-CM

## 2014-05-30 ENCOUNTER — Telehealth: Payer: Self-pay | Admitting: Neurology

## 2014-05-30 NOTE — Telephone Encounter (Signed)
Teresa Lawrence, patient was informed that Dr. Jannifer Franklin is out of office until 8/19. She asked if you could prescribe Belsomra sleep aid. Her last ov with you was 02/2013.

## 2014-05-30 NOTE — Telephone Encounter (Signed)
Patient calling to state she saw an ad for Belsomra sleep aid, states that she would like to try it and would like to discuss this, please return call to patient and advise. Made patient aware that Dr. Jannifer Franklin will be out of office until 06/06/14.

## 2014-05-31 NOTE — Telephone Encounter (Signed)
Patient can be scheduled with [facility] ok bye Hoyle Sauer .Waiting for a call back.

## 2014-05-31 NOTE — Telephone Encounter (Signed)
I have not seen this patient since 2012.

## 2014-05-31 NOTE — Telephone Encounter (Signed)
Called patient back and left her a message asking her to call me back.

## 2014-06-04 NOTE — Telephone Encounter (Signed)
Called patient and her mother told her to wait until December.

## 2014-06-05 ENCOUNTER — Telehealth: Payer: Self-pay | Admitting: Neurology

## 2014-06-05 MED ORDER — SUVOREXANT 20 MG PO TABS: 20.0000 mg | ORAL_TABLET | Freq: Every evening | ORAL | Status: DC | PRN

## 2014-06-05 NOTE — Telephone Encounter (Signed)
I called patient. She is having difficulty staying asleep, she is able to get to sleep fairly well, usually at 10 or 11 PM. She will wake up around 2 AM, unable to get back to sleep. I will have her come off of the rostral, we will try Belsomra.

## 2014-06-13 ENCOUNTER — Telehealth: Payer: Self-pay | Admitting: Neurology

## 2014-06-13 NOTE — Telephone Encounter (Signed)
I spoke with ins and was able to get them to approve coverage for this med.  I called the pharmacy.  Rx will be $1.80.  I called the patient back, got no answer.  Left message.

## 2014-06-13 NOTE — Telephone Encounter (Signed)
I called back, they have faxed [place] a form to complete.  We have completed and faxed it back.  Request is under review.

## 2014-06-13 NOTE — Telephone Encounter (Signed)
Patient stated insurance will not pay for Suvorexant (BELSOMRA) 20 MG TABS.  Patient requesting if she could have another medication, except for Ambien due to it keeps her up.  Please call anytime and may leave message if not available.

## 2014-06-13 NOTE — Telephone Encounter (Signed)
Teresa Lawrence with [facility] @ [phone], determining prior authorization for Suvorexant (BELSOMRA) 20 MG TABS.  Has questions regarding Rx request.  Please return call.

## 2014-06-14 ENCOUNTER — Telehealth: Payer: Self-pay

## 2014-06-14 NOTE — Telephone Encounter (Signed)
[employer] notified us they have approved our request for coverage on Belsomra effective until 10/18/2014 Ref ID # [id number]

## 2014-07-23 ENCOUNTER — Other Ambulatory Visit: Payer: Self-pay | Admitting: Neurology

## 2014-07-23 NOTE — Telephone Encounter (Signed)
Dr Willis is out of the office, forwarding request to [facility] for review.   

## 2014-07-26 NOTE — Telephone Encounter (Signed)
Patient calling back for Belsomra 20 mg refill.

## 2014-07-27 NOTE — Telephone Encounter (Signed)
Rx signed and faxed.

## 2014-08-01 ENCOUNTER — Other Ambulatory Visit: Payer: Self-pay | Admitting: Neurology

## 2014-08-02 ENCOUNTER — Telehealth: Payer: Self-pay | Admitting: Neurology

## 2014-08-02 NOTE — Telephone Encounter (Signed)
Patient requesting that her Luminal script gets sent to her [facility] on [address] so that she does not have to drive all the way over here to pick it up, please call and advise.

## 2014-08-02 NOTE — Telephone Encounter (Signed)
I called the patient to let her know that her Rx for Phenobarbital is at the front desk ready for pickup.

## 2014-08-02 NOTE — Telephone Encounter (Signed)
Rx has been sent to the pharmacy.  I called the patient.  She is aware.

## 2014-08-13 ENCOUNTER — Other Ambulatory Visit: Payer: Self-pay | Admitting: Neurology

## 2014-08-14 ENCOUNTER — Other Ambulatory Visit: Payer: Self-pay

## 2014-08-14 MED ORDER — CLONAZEPAM 2 MG PO TABS: ORAL_TABLET | ORAL | Status: DC

## 2014-08-14 NOTE — Telephone Encounter (Signed)
Rx signed and faxed.

## 2014-10-02 ENCOUNTER — Ambulatory Visit: Payer: Medicare Other | Admitting: Neurology

## 2014-11-14 ENCOUNTER — Other Ambulatory Visit: Payer: Self-pay | Admitting: Neurology

## 2014-11-14 NOTE — Telephone Encounter (Signed)
Rx signed and faxed.

## 2014-11-15 ENCOUNTER — Other Ambulatory Visit: Payer: Self-pay | Admitting: Neurology

## 2014-11-15 NOTE — Telephone Encounter (Signed)
I spoke with Sharyn Lull at the pharmacy who said this Rx was already filled and picked up. Asked that we disregard this request.

## 2014-11-22 ENCOUNTER — Other Ambulatory Visit: Payer: Self-pay | Admitting: Neurology

## 2014-12-05 ENCOUNTER — Other Ambulatory Visit: Payer: Self-pay | Admitting: Neurology

## 2015-02-19 ENCOUNTER — Other Ambulatory Visit: Payer: Self-pay | Admitting: Neurology

## 2015-02-21 ENCOUNTER — Other Ambulatory Visit: Payer: Self-pay

## 2015-02-21 MED ORDER — SUVOREXANT 20 MG PO TABS: 1.0000 | ORAL_TABLET | Freq: Every evening | ORAL | Status: DC | PRN

## 2015-02-21 MED ORDER — PHENOBARBITAL 64.8 MG PO TABS: 64.8000 mg | ORAL_TABLET | Freq: Two times a day (BID) | ORAL | Status: DC

## 2015-02-22 NOTE — Telephone Encounter (Signed)
Rx's have been faxed

## 2015-03-12 ENCOUNTER — Other Ambulatory Visit: Payer: Self-pay | Admitting: Neurology

## 2015-03-13 NOTE — Telephone Encounter (Signed)
Originally prescribed at [facility] on 05/30

## 2015-04-17 ENCOUNTER — Encounter: Payer: Self-pay | Admitting: Adult Health

## 2015-04-17 ENCOUNTER — Ambulatory Visit (INDEPENDENT_AMBULATORY_CARE_PROVIDER_SITE_OTHER): Payer: Medicare Other | Admitting: Adult Health

## 2015-04-17 VITALS — BP 107/69 | HR 79 | Ht 64.0 in | Wt 224.0 lb

## 2015-04-17 DIAGNOSIS — G2581 Restless legs syndrome: Secondary | ICD-10-CM

## 2015-04-17 DIAGNOSIS — F3170 Bipolar disorder, currently in remission, most recent episode unspecified: Secondary | ICD-10-CM | POA: Diagnosis not present

## 2015-04-17 DIAGNOSIS — R569 Unspecified convulsions: Secondary | ICD-10-CM | POA: Diagnosis not present

## 2015-04-17 DIAGNOSIS — Z5181 Encounter for therapeutic drug level monitoring: Secondary | ICD-10-CM

## 2015-04-17 NOTE — Patient Instructions (Addendum)
Continue Phenobarbital for seizures Continue Mirapex for Restless leg syndrome.  Will check blood work today. Continue Seroquel  Check to see if you are taking Lexapro.  Consider following up with [facility]

## 2015-04-17 NOTE — Progress Notes (Addendum)
PATIENT: Teresa Lawrence DOB: 1965-10-23  REASON FOR VISIT: follow up- seizures, bipolar disorder, insomnia, restless leg HISTORY FROM: patient  HISTORY OF PRESENT ILLNESS: Teresa Lawrence is a 49 year old female with a history of bipolar disorder, seizures, insomnia and restless leg. She returns today for follow-up. The patient is taking phenobarbital and tolerating it well. She denies any seizure events. She is able to complete all ADLs independently. She no longer operates a motor vehicle. The patient does have a history of anxiety and mood swings. She is currently on several medications to treat this. We prescribe Lexapro, Klonopin and Seroquel. She reports that this is working well for her. Although she is unsure if she is taking Lexapro. She did visit [facility] but states that they did not do much for her so she has not followed up. Patient continues to take Mirapex for her restless leg symptoms. She states without this medication she would not be able to sleep. Patient was recently started on Belsomra for insomnia and reports that it kept her awake all night. She is no longer taking this medication. She denies any new medical issues. She returns today for an evaluation.  HISTORY 03/26/2014 ([place]ILL[place]): Teresa Lawrence is a 49 year old right-handed white female with a history of bipolar disorder and a history of seizures. She has done quite well with her seizure events. The patient is on phenobarbital currently. The patient has continued to have a lot of anxiety issues and mood swings. She recently was placed on Abilify which seems to have helped initially. The patient has restless leg syndrome and she is on Mirapex for this. She returns to this office for further evaluation. The patient overall is doing fairly well. No new significant medical issues have come up since last seen.  REVIEW OF SYSTEMS: Out of a complete 14 system review of symptoms, the patient complains only of the following symptoms, and all  other reviewed systems are negative.  See history of present illness  ALLERGIES: Allergies  Allergen Reactions  . Requip [Ropinirole Hcl] Nausea And Vomiting    HOME MEDICATIONS: Outpatient Prescriptions Prior to Visit  Medication Sig Dispense Refill  . acyclovir (ZOVIRAX) 200 MG capsule Take 200 mg by mouth 3 (three) times daily as needed (fever blisters).     . ARIPiprazole (ABILIFY) 5 MG tablet Take 5 mg by mouth daily.    Marland Kitchen atorvastatin (LIPITOR) 80 MG tablet Take 80 mg by mouth daily.    . clonazePAM (KLONOPIN) 2 MG tablet TAKE ONE TABLET BY MOUTH TWICE DAILY AND TWO TABLETS AT BEDTIME 120 tablet 5  . DIPHENHYDRAMINE HCL, SLEEP, PO Take 1 tablet by mouth at bedtime as needed (for sleep).    Marland Kitchen escitalopram (LEXAPRO) 10 MG tablet TAKE ONE TABLET BY MOUTH ONCE DAILY 30 tablet 6  . furosemide (LASIX) 40 MG tablet Take 40 mg by mouth daily as needed.    . hydrocortisone cream 1 % Apply 1 application topically as needed (itching).    Marland Kitchen ibuprofen (ADVIL,MOTRIN) 600 MG tablet Take 1 tablet (600 mg total) by mouth every 6 (six) hours as needed (mild pain). 30 tablet 1  . loperamide (IMODIUM) 2 MG capsule Take 2 mg by mouth 4 (four) times daily as needed for diarrhea or loose stools.    . Multiple Vitamins-Minerals (MULTIVITAMIN WITH MINERALS) tablet Take 1 tablet by mouth daily.    Marland Kitchen neomycin-bacitracin-polymyxin (NEOSPORIN) [phone] ointment Apply 1 application topically as needed (for cuts).    Marland Kitchen omeprazole (PRILOSEC) 20 MG  capsule Take 20 mg by mouth 2 (two) times daily as needed (for reflux).     Marland Kitchen PHENobarbital (LUMINAL) 64.8 MG tablet Take 1 tablet (64.8 mg total) by mouth 2 (two) times daily. 60 tablet 0  . pramipexole (MIRAPEX) 0.25 MG tablet TAKE ONE TABLET BY MOUTH THREE TIMES DAILY 90 tablet 3  . PRESCRIPTION MEDICATION Place 1 suppository vaginally daily as needed (for yeast infections). Boric Acid Suppository    . promethazine (PHENERGAN) 25 MG tablet Take 25 mg by mouth every  6 (six) hours as needed for nausea or vomiting.    Marland Kitchen QUEtiapine (SEROQUEL) 100 MG tablet TAKE ONE & ONE-HALF TABLETS BY MOUTH AT BEDTIME 45 tablet 0  . SF 1.1 % GEL dental gel Place 1.1 mLs onto teeth.    . Suvorexant (BELSOMRA) 20 MG TABS Take 1 tablet by mouth at bedtime as needed. 15 tablet 1  . temazepam (RESTORIL) 30 MG capsule Take 30 mg by mouth daily.    Marland Kitchen triamcinolone (KENALOG) 0.1 % paste Place 1 application onto teeth as needed (for mouth ulcers).      No facility-administered medications prior to visit.    PAST MEDICAL HISTORY: Past Medical History  Diagnosis Date  . Vaginal yeast infection 2007    recurrent   . PMS (premenstrual syndrome)   . Dysmenorrhea   . Yeast vaginitis   . Dysuria   . High blood cholesterol   . Perimenopausal vasomotor symptoms   . HSV-2 infection   . Anxiety   . Bipolar disorder   . Menorrhagia   . Mass of ovary     left side  . Uterine mass   . Hx of ovarian cyst   . Hx of seizure disorder   . Dysplasia   . Abnormal Pap smear   . Hx of hypercholesterolemia   . PMS (premenstrual syndrome)   . PMS (premenstrual syndrome)   . Headache(784.0)   . RLS (restless legs syndrome)   . Mild mental retardation   . Seizures   . Dysrhythmia     episodes of palpitations  . H/O head injury   . Mental retardation     mild per Dr. notes in [facility]    PAST SURGICAL HISTORY: Past Surgical History  Procedure Laterality Date  . Hysteroscopy w/ endometrial ablation    . Dilation and curettage of uterus    . Polypectomy    . Colposcopy    . Endometrial biopsy    . Wisdom tooth extraction    . Laparoscopic hysterectomy Right 05/30/2013    Procedure: HYSTERECTOMY TOTAL LAPAROSCOPIC;  Surgeon: Betsy Coder, MD;  Location: [facility];  Service: Gynecology;  Laterality: Right;  Right Salpingectomy  . Cystoscopy Bilateral 05/30/2013    Procedure: CYSTOSCOPY;  Surgeon: Betsy Coder, MD;  Location: [facility];  Service: Gynecology;  Laterality: Bilateral;  .  Abdominal hysterectomy  2013    FAMILY HISTORY: Family History  Problem Relation Age of Onset  . Heart attack Father     SOCIAL HISTORY: History   Social History  . Marital Status: Single    Spouse Name: N/A  . Number of Children: 0  . Years of Education: HS   Occupational History  . Unemployed    Social History Main Topics  . Smoking status: Former Research scientist (life sciences)  . Smokeless tobacco: Never Used  . Alcohol Use: No  . Drug Use: No  . Sexual Activity: Not on file   Other Topics Concern  . Not on  file   Social History Narrative  . No narrative on file      PHYSICAL EXAM  Filed Vitals:   04/17/15 1424  Height: 5\' 4"  (1.626 m)  Weight: 224 lb (101.606 kg)   There is no weight on file to calculate BMI.  Generalized: Well developed, in no acute distress   Neurological examination  Mentation: Alert oriented to time, place, history taking. Follows all commands speech and language fluent Cranial nerve II-XII: Pupils were equal round reactive to light. Extraocular movements were full, visual field were full on confrontational test. Facial sensation and strength were normal. Uvula tongue midline. Head turning and shoulder shrug  were normal and symmetric. Motor: The motor testing reveals 5 over 5 strength of all 4 extremities. Good symmetric motor tone is noted throughout.  Sensory: Sensory testing is intact to soft touch on all 4 extremities. No evidence of extinction is noted.  Coordination: Cerebellar testing reveals good finger-nose-finger and heel-to-shin bilaterally.  Gait and station: Gait is normal. Tandem gait is slightly unsteady. Romberg is negative. No drift is seen.  Reflexes: Deep tendon reflexes are symmetric and normal bilaterally.    DIAGNOSTIC DATA (LABS, IMAGING, TESTING) - I reviewed patient records, labs, notes, testing and imaging myself where available.    ASSESSMENT AND PLAN 49 y.o. year old female  has a past medical history of Vaginal yeast  infection (2007); PMS (premenstrual syndrome); Dysmenorrhea; Yeast vaginitis; Dysuria; High blood cholesterol; Perimenopausal vasomotor symptoms; HSV-2 infection; Anxiety; Bipolar disorder; Menorrhagia; Mass of ovary; Uterine mass; ovarian cyst; seizure disorder; Dysplasia; Abnormal Pap smear; hypercholesterolemia; PMS (premenstrual syndrome); PMS (premenstrual syndrome); Headache(784.0); RLS (restless legs syndrome); Mild mental retardation; Seizures; Dysrhythmia; H/O head injury; and Mental retardation. here with:  1. Seizures 2. Bipolar disorder 3. Insomnia 4. Restless leg syndrome   Overall the patient is doing well. She will continue to take phenobarbital for her seizures. I will check blood work today. The patient will continue taking Mirapex for restless leg syndrome. For now she'll continue taking Seroquel and Klonopin. The patient is unsure if she is taking Lexapro, she will check it at home and let us know. I have encouraged the patient to follow-up with [facility]. The patient verbalized understanding. If her symptoms worsen or she develops new symptoms she should let us know. Otherwise she'll follow-up in one year or sooner if needed.  Ward Givens, MSN, NP-C 04/17/2015, 2:23 PM [facility] [address], [place], [place] [address] [phone]  Note: This document was prepared with digital dictation and possible smart phrase technology. Any transcriptional errors that result from this process are unintentional.   Personally participated in and made any corrections needed to history, physical, neuro exam,assessment and plan and agree as stated above.  Sarina Ill, MD [facility]

## 2015-04-18 ENCOUNTER — Telehealth: Payer: Self-pay

## 2015-04-18 LAB — CBC WITH DIFFERENTIAL/PLATELET
Basophils Absolute: 0 10*3/uL (ref 0.0–0.2)
Basos: 1 %
EOS (ABSOLUTE): 0.2 10*3/uL (ref 0.0–0.4)
Eos: 3 %
Hematocrit: 38.5 % (ref 34.0–46.6)
Hemoglobin: 12.9 g/dL (ref 11.1–15.9)
Immature Grans (Abs): 0 10*3/uL (ref 0.0–0.1)
Immature Granulocytes: 0 %
LYMPHS ABS: 2.1 10*3/uL (ref 0.7–3.1)
Lymphs: 26 %
MCH: 29.7 pg (ref 26.6–33.0)
MCHC: 33.5 g/dL (ref 31.5–35.7)
MCV: 89 fL (ref 79–97)
MONOS ABS: 0.7 10*3/uL (ref 0.1–0.9)
Monocytes: 9 %
NEUTROS PCT: 61 %
Neutrophils Absolute: 5.1 10*3/uL (ref 1.4–7.0)
PLATELETS: 334 10*3/uL (ref 150–379)
RBC: 4.34 x10E6/uL (ref 3.77–5.28)
RDW: 13.4 % (ref 12.3–15.4)
WBC: 8.2 10*3/uL (ref 3.4–10.8)

## 2015-04-18 LAB — COMPREHENSIVE METABOLIC PANEL
ALT: 17 IU/L (ref 0–32)
AST: 16 IU/L (ref 0–40)
Albumin/Globulin Ratio: 1.1 (ref 1.1–2.5)
Albumin: 3.8 g/dL (ref 3.5–5.5)
Alkaline Phosphatase: 119 IU/L — ABNORMAL HIGH (ref 39–117)
BUN / CREAT RATIO: 11 (ref 9–23)
BUN: 7 mg/dL (ref 6–24)
Bilirubin Total: 0.3 mg/dL (ref 0.0–1.2)
CALCIUM: 9.2 mg/dL (ref 8.7–10.2)
CHLORIDE: 101 mmol/L (ref 97–108)
CO2: 25 mmol/L (ref 18–29)
Creatinine, Ser: 0.61 mg/dL (ref 0.57–1.00)
GFR, EST AFRICAN AMERICAN: 124 mL/min/{1.73_m2} (ref >59)
GFR, EST NON AFRICAN AMERICAN: 108 mL/min/{1.73_m2} (ref >59)
Globulin, Total: 3.4 g/dL (ref 1.5–4.5)
Glucose: 90 mg/dL (ref 65–99)
Potassium: 4.5 mmol/L (ref 3.5–5.2)
Sodium: 141 mmol/L (ref 134–144)
Total Protein: 7.2 g/dL (ref 6.0–8.5)

## 2015-04-18 LAB — PHENOBARBITAL LEVEL: Phenobarbital Lvl: 6 ug/mL — ABNORMAL LOW (ref 15–40)

## 2015-04-18 NOTE — Telephone Encounter (Signed)
-----   Message from Ward Givens, NP sent at 04/18/2015  7:37 AM EDT ----- Lab work is ok. Please call the patient.

## 2015-04-18 NOTE — Telephone Encounter (Signed)
Called and spoke to patient relayed lab work was ok. Patient understood.

## 2015-05-06 ENCOUNTER — Other Ambulatory Visit: Payer: Self-pay

## 2015-05-06 MED ORDER — SUVOREXANT 20 MG PO TABS: 1.0000 | ORAL_TABLET | Freq: Every evening | ORAL | Status: DC | PRN

## 2015-05-07 NOTE — Telephone Encounter (Signed)
Rx signed and faxed.

## 2015-05-20 ENCOUNTER — Other Ambulatory Visit: Payer: Self-pay | Admitting: Neurology

## 2015-05-22 ENCOUNTER — Other Ambulatory Visit: Payer: Self-pay | Admitting: Neurology

## 2015-05-28 ENCOUNTER — Other Ambulatory Visit: Payer: Self-pay

## 2015-05-28 MED ORDER — SUVOREXANT 20 MG PO TABS: 1.0000 | ORAL_TABLET | Freq: Every evening | ORAL | Status: DC | PRN

## 2015-05-28 NOTE — Telephone Encounter (Signed)
Rx signed and faxed.

## 2015-05-28 NOTE — Telephone Encounter (Signed)
Pharmacy requesting #20 instead of #15 because they are packaged in a blister pack of 10, and pack cannot be broken.  Thank you.

## 2015-06-04 ENCOUNTER — Telehealth: Payer: Self-pay | Admitting: Neurology

## 2015-06-04 NOTE — Telephone Encounter (Signed)
Pt needs refill on QUEtiapine (SEROQUEL) 100 MG tablet

## 2015-06-04 NOTE — Telephone Encounter (Signed)
It appears this Rx was already sent previously.  I called the pharmacy.  Spoke with [employer].  She verified the patient has several refills on file, however, it is refill too soon until 08/25.  I called the patient back.  States she still has about 2 weeks of meds remaining.  She is aware it is too soon to process at this time, and pharmacy does have refills on file.

## 2015-06-06 ENCOUNTER — Telehealth: Payer: Self-pay | Admitting: Neurology

## 2015-06-06 NOTE — Telephone Encounter (Signed)
Patient called inquiring why she can not get her RX-pharmacy told it was declined. I read the message to her but she does not remember the conversation with Janett Billow. She said she has 1 week of meds  left. Please call and advise.

## 2015-06-06 NOTE — Telephone Encounter (Signed)
I called the pharmacy.  Spoke with [employer].  Once again, she verified they do have the Rx, however, it is too soon to refill at this time, and they will be able to refill the Rx on 08/25.  States she personally spoke with the patient this morning as well and explained this to her.  I tried to call the patient back x3, however, the number we have listed is currently not valid.  Message says the line has been changed, disconnected, or is no longer in service.  If patient calls back, please verify current contact number.  Thank you!

## 2015-06-18 NOTE — Telephone Encounter (Signed)
error 

## 2015-07-25 ENCOUNTER — Other Ambulatory Visit: Payer: Self-pay | Admitting: Neurology

## 2015-09-09 ENCOUNTER — Telehealth: Payer: Self-pay | Admitting: Neurology

## 2015-09-09 NOTE — Telephone Encounter (Signed)
Patient called to advise pramipexole (MIRAPEX) 0.25 MG tablet is not working as well, wants to know if dosage can be increased or can Dr. Jannifer Franklin prescribe something else.

## 2015-09-09 NOTE — Telephone Encounter (Signed)
I called the patient. The patient has a prescription for Mirapex 0.25 mg taking 1 tablet 3 times daily. In reality, the patient is only taking 1 tablet at night. She generally does not take the medication during the daytime. The restless legs symptoms are worse at night, she has to be on Abilify which is likely the culprit. I have indicated that she is to go up to taking 2 of the 0.25 mg tablets at night.

## 2015-09-27 ENCOUNTER — Telehealth: Payer: Self-pay | Admitting: Neurology

## 2015-09-27 DIAGNOSIS — F3162 Bipolar disorder, current episode mixed, moderate: Secondary | ICD-10-CM

## 2015-09-27 NOTE — Telephone Encounter (Signed)
I called patient. She has had some recent issues with mood swings. Unfortunately, I'm not trained to treat bipolar disorder, I cannot manage this for her, I will try to get a psychiatric referral.

## 2015-09-27 NOTE — Telephone Encounter (Signed)
Pt called sts she had a bi-polar episode last night. Screaming at her parents, she threw her cell phone and broke it, her father threatened to call the police but did not. She does not know what caused it, it just hit her all of a sudden. She said she has had an episode like this but it has been a long time and never been this bad. Pt is crying and upset. Please call and advise

## 2015-09-27 NOTE — Telephone Encounter (Signed)
I called the patient. She does not currently see a psychiatrist for her bipolar disorder. She would like for Dr. Jannifer Franklin to see her and make any necessary medication changes. I advised that I would check with him prior to making that appointment. I advised he may want to refer her to a psychiatrist. She verbalized understanding.

## 2015-10-14 ENCOUNTER — Other Ambulatory Visit: Payer: Self-pay | Admitting: Neurology

## 2015-10-25 ENCOUNTER — Telehealth: Payer: Self-pay

## 2015-10-25 NOTE — Telephone Encounter (Signed)
Patient did not pick up Rx from 08/02/14.

## 2015-11-12 ENCOUNTER — Telehealth: Payer: Self-pay | Admitting: Neurology

## 2015-11-12 NOTE — Telephone Encounter (Signed)
I called patient. The insurance is no longer covering Nokomis. She is already on Restoril, clonazepam, Seroquel at night. Not sure I want to add other medication at this time.

## 2015-11-12 NOTE — Telephone Encounter (Signed)
Patient is calling and states her insurance will not pay for Rx Belsomra 20 mg tablets anymore. She does not want Ambiem as it keeps her up at night. Please call. Thanks!

## 2015-11-15 ENCOUNTER — Other Ambulatory Visit: Payer: Self-pay | Admitting: Neurology

## 2015-11-15 MED ORDER — SUVOREXANT 20 MG PO TABS: 20.0000 mg | ORAL_TABLET | Freq: Every evening | ORAL | Status: DC | PRN

## 2015-11-15 NOTE — Telephone Encounter (Signed)
I called the patient to let her know Belsomra was approved by [employer]. Rx sent to [facility].

## 2015-12-06 ENCOUNTER — Other Ambulatory Visit: Payer: Self-pay | Admitting: Neurology

## 2016-02-05 ENCOUNTER — Other Ambulatory Visit: Payer: Self-pay | Admitting: Neurology

## 2016-02-06 NOTE — Telephone Encounter (Signed)
Rx printed, signed, up front for pick-up. 

## 2016-02-25 ENCOUNTER — Other Ambulatory Visit: Payer: Self-pay | Admitting: Neurology

## 2016-02-25 NOTE — Telephone Encounter (Signed)
Rx printed, signed, faxed to pharmacy. F # [id number]

## 2016-02-26 ENCOUNTER — Other Ambulatory Visit: Payer: Self-pay | Admitting: Gastroenterology

## 2016-04-04 ENCOUNTER — Other Ambulatory Visit: Payer: Self-pay | Admitting: Neurology

## 2016-04-15 ENCOUNTER — Ambulatory Visit: Payer: Medicare Other | Admitting: Adult Health

## 2016-04-22 ENCOUNTER — Other Ambulatory Visit: Payer: Self-pay | Admitting: Neurology

## 2016-04-22 NOTE — Telephone Encounter (Signed)
Last OV was 04/17/15 but pt no-showed appt w/ Megan NP on 04/15/16. Pt did reschedule appt to 06/04/16.  Recently e-scribed 04/06/16 #[phone].

## 2016-04-29 ENCOUNTER — Other Ambulatory Visit: Payer: Self-pay | Admitting: Neurology

## 2016-05-14 ENCOUNTER — Telehealth: Payer: Self-pay | Admitting: Neurology

## 2016-05-14 ENCOUNTER — Encounter: Payer: Self-pay | Admitting: Neurology

## 2016-05-14 ENCOUNTER — Ambulatory Visit (INDEPENDENT_AMBULATORY_CARE_PROVIDER_SITE_OTHER): Payer: Medicare Other | Admitting: Neurology

## 2016-05-14 VITALS — BP 122/76 | HR 82 | Ht 65.0 in | Wt 226.0 lb

## 2016-05-14 DIAGNOSIS — G43019 Migraine without aura, intractable, without status migrainosus: Secondary | ICD-10-CM | POA: Diagnosis not present

## 2016-05-14 DIAGNOSIS — Z5181 Encounter for therapeutic drug level monitoring: Secondary | ICD-10-CM

## 2016-05-14 DIAGNOSIS — G40909 Epilepsy, unspecified, not intractable, without status epilepticus: Secondary | ICD-10-CM | POA: Diagnosis not present

## 2016-05-14 MED ORDER — TOPIRAMATE 25 MG PO TABS: 25.0000 mg | ORAL_TABLET | Freq: Two times a day (BID) | ORAL | 3 refills | Status: DC

## 2016-05-14 NOTE — Progress Notes (Signed)
Reason for visit: Seizures  Teresa Lawrence is an 50 y.o. female  History of present illness:  Teresa Lawrence is a 50 year old right-handed white female with a history of bipolar disorder, anxiety disorder, headaches, and seizures. The patient has done very well with her seizures for many years. The patient believes that her last seizure was when she was 50 years old. The patient has been on phenobarbital. She continues to have virtually daily headaches that are at times quite severe. The patient had an event last evening that was witnessed by her mother. The patient claims that she was in the shower, felt dizzy and may have blacked out briefly. The patient did not fall. The patient had gotten out of the shower, and was drying off, the patient started having twitching of the right face, right arm became stiff, the patient was not responding. The patient was falling over to the right and the mother helped her down on her side. The patient started becoming responsive within about 10 minutes. The patient claims that she has not missed any of her medications, she has not stopped or started any new medications. The patient continues to have ongoing headaches, at times she has the sensation of someone is sticking a knife in her head. The patient continues to have frequent anxiety episodes. She comes to the office today for an evaluation.  Past Medical History:  Diagnosis Date  . Abnormal Pap smear   . Anxiety   . Bipolar disorder ([place])   . Dysmenorrhea   . Dysplasia   . Dysrhythmia    episodes of palpitations  . Dysuria   . H/O head injury   . Headache(784.0)   . High blood cholesterol   . HSV-2 infection   . Hx of hypercholesterolemia   . Hx of ovarian cyst   . Hx of seizure disorder   . Mass of ovary    left side  . Menorrhagia   . Mental retardation    mild per Dr. notes in [facility]  . Mild mental retardation   . Perimenopausal vasomotor symptoms   . PMS (premenstrual syndrome)   . PMS  (premenstrual syndrome)   . PMS (premenstrual syndrome)   . RLS (restless legs syndrome)   . Seizures ([place])   . Uterine mass   . Vaginal yeast infection 2007   recurrent   . Yeast vaginitis     Past Surgical History:  Procedure Laterality Date  . ABDOMINAL HYSTERECTOMY  2013  . COLPOSCOPY    . CYSTOSCOPY Bilateral 05/30/2013   Procedure: CYSTOSCOPY;  Surgeon: Betsy Coder, MD;  Location: [facility];  Service: Gynecology;  Laterality: Bilateral;  . DILATION AND CURETTAGE OF UTERUS    . ENDOMETRIAL BIOPSY    . HYSTEROSCOPY W/ ENDOMETRIAL ABLATION    . LAPAROSCOPIC HYSTERECTOMY Right 05/30/2013   Procedure: HYSTERECTOMY TOTAL LAPAROSCOPIC;  Surgeon: Betsy Coder, MD;  Location: [facility];  Service: Gynecology;  Laterality: Right;  Right Salpingectomy  . POLYPECTOMY    . WISDOM TOOTH EXTRACTION      Family History  Problem Relation Age of Onset  . Heart attack Father     Social history:  reports that she has quit smoking. She has never used smokeless tobacco. She reports that she does not drink alcohol or use drugs.    Allergies  Allergen Reactions  . Requip [Ropinirole Hcl] Nausea And Vomiting    Medications:  Prior to Admission medications   Medication Sig Start Date End  Date Taking? Authorizing Provider  acyclovir (ZOVIRAX) 200 MG capsule Take 200 mg by mouth 3 (three) times daily as needed (fever blisters).    Yes Historical Provider, MD  atorvastatin (LIPITOR) 80 MG tablet Take 80 mg by mouth daily. 03/08/14  Yes Historical Provider, MD  clonazePAM (KLONOPIN) 2 MG tablet TAKE ONE TABLET BY MOUTH TWICE DAILY AND TAKE TWO TABLET AT BEDTIME 02/25/16  Yes [name], MD  DIPHENHYDRAMINE HCL, SLEEP, PO Take 1 tablet by mouth at bedtime as needed (for sleep).   Yes Historical Provider, MD  escitalopram (LEXAPRO) 10 MG tablet TAKE ONE TABLET BY MOUTH ONCE DAILY 12/28/13  Yes [name], MD  esomeprazole (NEXIUM) 40 MG capsule Take 40 mg by mouth 2 (two) times daily before  a meal.   Yes Historical Provider, MD  furosemide (LASIX) 40 MG tablet Take 40 mg by mouth daily as needed. 07/24/13  Yes Historical Provider, MD  hydrocortisone cream 1 % Apply 1 application topically as needed (itching).   Yes Historical Provider, MD  ibuprofen (ADVIL,MOTRIN) 600 MG tablet Take 1 tablet (600 mg total) by mouth every 6 (six) hours as needed (mild pain). 05/31/13  Yes Earnstine Regal, PA-C  loperamide (IMODIUM) 2 MG capsule Take 2 mg by mouth 4 (four) times daily as needed for diarrhea or loose stools.   Yes Historical Provider, MD  Multiple Vitamins-Minerals (MULTIVITAMIN WITH MINERALS) tablet Take 1 tablet by mouth daily.   Yes Historical Provider, MD  neomycin-bacitracin-polymyxin (NEOSPORIN) [phone] ointment Apply 1 application topically as needed (for cuts).   Yes Historical Provider, MD  PHENobarbital (LUMINAL) 64.8 MG tablet TAKE ONE TABLET BY MOUTH TWICE DAILY 12/06/15  Yes [name], MD  pramipexole (MIRAPEX) 0.25 MG tablet TAKE ONE TABLET BY MOUTH THREE TIMES DAILY 04/29/16  Yes [name], MD  PRESCRIPTION MEDICATION Place 1 suppository vaginally daily as needed (for yeast infections). Boric Acid Suppository   Yes Historical Provider, MD  promethazine (PHENERGAN) 25 MG tablet Take 25 mg by mouth every 6 (six) hours as needed for nausea or vomiting.   Yes Historical Provider, MD  QUEtiapine (SEROQUEL) 100 MG tablet TAKE ONE & ONE-HALF TABLETS BY MOUTH AT BEDTIME 05/20/15  Yes [name], MD  SF 1.1 % GEL dental gel Place 1.1 mLs onto teeth. 08/31/13  Yes Historical Provider, MD  Suvorexant (BELSOMRA) 20 MG TABS Take 20 mg by mouth at bedtime as needed. 11/15/15  Yes [name], MD  triamcinolone (KENALOG) 0.1 % paste Place 1 application onto teeth as needed (for mouth ulcers).  03/02/13  Yes Historical Provider, MD  topiramate (TOPAMAX) 25 MG tablet Take 1 tablet (25 mg total) by mouth 2 (two) times daily. 05/14/16   [name], MD    ROS:  Out of  a complete 14 system review of symptoms, the patient complains only of the following symptoms, and all other reviewed systems are negative.  Fatigue Nausea Restless legs, insomnia, frequent waking Environmental allergies Joint pain, muscle cramps Dizziness, headache, numbness, history of seizures Agitation, depression, anxiety, hyperactivity  Blood pressure 122/76, pulse 82, height 5\' 5"  (1.651 m), weight 226 lb (102.5 kg), last menstrual period 05/11/2013.  Physical Exam  General: The patient is alert and cooperative at the time of the examination. The patient is moderately to markedly obese.  Skin: No significant peripheral edema is noted.   Neurologic Exam  Mental status: The patient is alert and oriented x 3 at the time of the examination.  The patient has apparent normal recent and remote memory, with an apparently normal attention span and concentration ability.   Cranial nerves: Facial symmetry is present. Speech is normal, no aphasia or dysarthria is noted. Extraocular movements are full. Visual fields are full. Pupils are equal, round, and reactive to light. Discs are flat bilaterally.  Motor: The patient has good strength in all 4 extremities.  Sensory examination: Soft touch sensation is symmetric on the face, arms, and legs.  Coordination: The patient has good finger-nose-finger and heel-to-shin bilaterally.  Gait and station: The patient has a normal gait. Tandem gait is slightly unsteady. Romberg is negative. No drift is seen.  Reflexes: Deep tendon reflexes are symmetric.   Assessment/Plan:  1. Recent seizure event  2. Chronic daily headache  3. Bipolar disorder  5. Anxiety disorder, panic disorder  The patient appears to have had a recent seizure event, the reason why this occurred is unclear. The patient will be sent for blood work today. She will be placed on Topamax for the headaches and for the seizures. She will follow-up in 4 months for  evaluation.  Jill Alexanders MD 05/14/2016 3:26 PM  [facility] [address] [place], [place] [address]  Phone [phone] Fax [phone]

## 2016-05-14 NOTE — Telephone Encounter (Signed)
Called pt back. Let her know that Dr. Jannifer Franklin had a cancellation this afternoon if she would like to come in sooner. Agreed to arrive 15 min prior to appt time.

## 2016-05-14 NOTE — Telephone Encounter (Signed)
Will discuss at office visit this afternoon.

## 2016-05-14 NOTE — Telephone Encounter (Signed)
Called and spoke to pt. Reports that she still has a headache but is feeling some better. Has an appt scheduled w/ Megan NP next Tues. Agreed to call back if needed before then. Voiced appreciation for care and for Dr. Jannifer Franklin' call yesterday.

## 2016-05-14 NOTE — Telephone Encounter (Signed)
Patient called, states she had seizure last night, Mother told her there is formaldehyde in diet drinks, wants to know if this could be related to her seizure? Please call.

## 2016-05-14 NOTE — Patient Instructions (Signed)
Topamax (topiramate) is a seizure medication that has an FDA approval for seizures and for migraine headache. Potential side effects of this medication include weight loss, cognitive slowing, tingling in the fingers and toes, and carbonated drinks will taste bad. If any significant side effects are noted on this drug, please contact our office.   Begin Topamax 25 mg one a day for one week, then take one tablet twice a day.

## 2016-05-14 NOTE — Telephone Encounter (Signed)
I called the mother, the patient had a seizure last PM with onset of right facial jerking spreading to the arm with altered mental status. We will get the patient into the office for a check. No history of missed medication dosing. The patient is on phenobarbital.

## 2016-05-15 ENCOUNTER — Telehealth: Payer: Self-pay | Admitting: Neurology

## 2016-05-15 LAB — COMPREHENSIVE METABOLIC PANEL
A/G RATIO: 1.4 (ref 1.2–2.2)
ALT: 13 IU/L (ref 0–32)
AST: 13 IU/L (ref 0–40)
Albumin: 4.2 g/dL (ref 3.5–5.5)
Alkaline Phosphatase: 98 IU/L (ref 39–117)
BILIRUBIN TOTAL: 0.3 mg/dL (ref 0.0–1.2)
BUN/Creatinine Ratio: 10 (ref 9–23)
BUN: 7 mg/dL (ref 6–24)
CALCIUM: 9.3 mg/dL (ref 8.7–10.2)
CO2: 24 mmol/L (ref 18–29)
Chloride: 97 mmol/L (ref 96–106)
Creatinine, Ser: 0.68 mg/dL (ref 0.57–1.00)
GFR, EST AFRICAN AMERICAN: 119 mL/min/{1.73_m2} (ref >59)
GFR, EST NON AFRICAN AMERICAN: 103 mL/min/{1.73_m2} (ref >59)
GLOBULIN, TOTAL: 3.1 g/dL (ref 1.5–4.5)
Glucose: 92 mg/dL (ref 65–99)
POTASSIUM: 3.9 mmol/L (ref 3.5–5.2)
SODIUM: 138 mmol/L (ref 134–144)
Total Protein: 7.3 g/dL (ref 6.0–8.5)

## 2016-05-15 LAB — CBC WITH DIFFERENTIAL/PLATELET
BASOS: 0 %
Basophils Absolute: 0 10*3/uL (ref 0.0–0.2)
EOS (ABSOLUTE): 0.1 10*3/uL (ref 0.0–0.4)
Eos: 1 %
Hematocrit: 36.8 % (ref 34.0–46.6)
Hemoglobin: 12.2 g/dL (ref 11.1–15.9)
Immature Grans (Abs): 0 10*3/uL (ref 0.0–0.1)
Immature Granulocytes: 0 %
Lymphocytes Absolute: 1.9 10*3/uL (ref 0.7–3.1)
Lymphs: 20 %
MCH: 28.6 pg (ref 26.6–33.0)
MCHC: 33.2 g/dL (ref 31.5–35.7)
MCV: 86 fL (ref 79–97)
MONOS ABS: 0.6 10*3/uL (ref 0.1–0.9)
Monocytes: 6 %
NEUTROS ABS: 6.8 10*3/uL (ref 1.4–7.0)
Neutrophils: 73 %
PLATELETS: 345 10*3/uL (ref 150–379)
RBC: 4.26 x10E6/uL (ref 3.77–5.28)
RDW: 14.1 % (ref 12.3–15.4)
WBC: 9.4 10*3/uL (ref 3.4–10.8)

## 2016-05-15 LAB — PHENOBARBITAL LEVEL: Phenobarbital, Serum: 3 ug/mL — ABNORMAL LOW (ref 15–40)

## 2016-05-15 NOTE — Telephone Encounter (Signed)
I called the patient, talk with the mother. The blood work is unremarkable with exception that the phenobarbital level is 3. Her levels usually are low, running between 11-17. I suspect that patient has been missing doses of the phenobarbital. This may have been the source of the seizure. They are to ensure that she gets medications in properly, Topamax has been added to the regimen for the headaches but this will also offer benefit with seizure control. We will not alter the dosing of the phenobarbital at this time, but we will follow levels.

## 2016-05-19 ENCOUNTER — Other Ambulatory Visit: Payer: Self-pay | Admitting: Neurology

## 2016-05-19 ENCOUNTER — Ambulatory Visit: Payer: Medicare Other | Admitting: Adult Health

## 2016-05-19 NOTE — Telephone Encounter (Signed)
Rx printed, signed, faxed to pharmacy. 

## 2016-05-22 ENCOUNTER — Other Ambulatory Visit: Payer: Self-pay | Admitting: Neurology

## 2016-05-26 ENCOUNTER — Telehealth: Payer: Self-pay | Admitting: Neurology

## 2016-05-26 NOTE — Telephone Encounter (Signed)
I called patient. The patient was inquiring about hypnosis for treatment of seizures, I'm not sure that this would be effective, but it may help some of her anxiety issues. I do not think insurance would pay for this therapy, however.

## 2016-05-26 NOTE — Telephone Encounter (Signed)
Pt called in and is wondering about hypnosis to prevent seizures. Last seizure was a couple of weeks ago in the house bathroom. She got dizzy and ended up having a seizure. She says she is sick of being bipolar. She just wants to try something. She would like to know if insurance will cover it. Please call and advise [phone]

## 2016-05-28 ENCOUNTER — Telehealth: Payer: Self-pay | Admitting: Neurology

## 2016-05-28 MED ORDER — ZONISAMIDE 25 MG PO CAPS: ORAL_CAPSULE | ORAL | 3 refills | Status: DC

## 2016-05-28 NOTE — Addendum Note (Signed)
Addended by: Margette Fast on: 05/28/2016 07:45 AM   Modules accepted: Orders

## 2016-05-28 NOTE — Telephone Encounter (Signed)
I called patient. The patient has had nausea since going up on the Topamax to 50 mg daily. She is to go down to 1 tablet or 25 mg daily for one week, then stop the drug. After this, if she is feeling well she is to start the [place]. The purpose of medication is for seizure control and for her headaches.

## 2016-05-28 NOTE — Telephone Encounter (Signed)
Pt called in 5:30am stated that she was recently prescribed topamax by Dr Jannifer Franklin for seizure control, 25mg  bid. She took it 25mg  daily for a while and she felt OK and then started last Saturday with bid dosing. Since then, she stated that she felt sick to stomach. She takes topamax with meals, last dose was last night with supper. However, she woke up 2am with stomach rolling, very nauseous, lightheadedness. She had to take some antiemesis pills and had to eat some crackers. She felt she is not tolerating the medication.   I told her that do not take the morning dose today and I will send the message to Dr. Jannifer Franklin for further suggestion about medication regimen. She expressed understanding and appreciation.   Rosalin Hawking, MD PhD Stroke Neurology 05/28/2016 5:50 AM

## 2016-05-30 ENCOUNTER — Telehealth: Payer: Self-pay | Admitting: Neurology

## 2016-05-30 NOTE — Telephone Encounter (Signed)
Pt called in about questions of medication. She is confused about the dosage of the new medication. I reiterated that the zonegran is prescribed as one tablet 25mg  bid for 2 weeks and then two tablets 50mg  bid.   She still has nausea lingering effect from topamax and I encourage her to wait until all side effects from topamax go away before starting zonegran so that we will better know whether she is tolerating well with zonegran. She expressed understanding and appreciation.   Rosalin Hawking, MD PhD Stroke Neurology 05/30/2016 9:54 AM

## 2016-05-31 ENCOUNTER — Telehealth: Payer: Self-pay | Admitting: Neurology

## 2016-05-31 NOTE — Telephone Encounter (Signed)
Pt called in again stated that she started the zonegran last night and she felt nausea, lightheadedness, really sick to the stomach. She took medication with food every time but not able to tolerate. She said she had problems in the past with any new medication.  Yesterday in a phone call, I recommended her to start zonegran after all side effects from topamax subsided. She stated that she forgot about that and started zonegran with lingering effect with topamax.   I suggested her to stop both medication now and give a couple of days to let the medication out of her body and may consider to give zonegran another try. However, pt said she would not take any of them anymore. She said her seizure is very infrequent and not grand mal, and she thinks phenobarbital alone may be enough. I asked her to give a call to Dr. Jannifer Franklin in the morning to discuss about further plan. She expressed understanding and appreciation.  Rosalin Hawking, MD PhD Stroke Neurology 05/31/2016 2:34 PM

## 2016-06-01 NOTE — Telephone Encounter (Signed)
Pt called said she doesn't want to take any medication but the phenobarbital. She is taking Phenobarbital as directed. Pt said she is still nauseated today, she thinks it is lingering affects of Zonegran and tompamax. She would like to speak with Dr Jannifer Franklin when he returns on Wednesday.

## 2016-06-02 MED ORDER — GABAPENTIN 100 MG PO CAPS: ORAL_CAPSULE | ORAL | 1 refills | Status: DC

## 2016-06-02 NOTE — Addendum Note (Signed)
Addended by: Margette Fast on: 06/02/2016 05:17 PM   Modules accepted: Orders

## 2016-06-02 NOTE — Telephone Encounter (Signed)
I called patient. She could not tolerate the Topamax or the Zonegran, she has stopped his medications, now feels better, I will try low-dose gabapentin for her migraine, starting on a pediatric dose.

## 2016-06-18 ENCOUNTER — Other Ambulatory Visit: Payer: Self-pay | Admitting: Neurology

## 2016-06-27 ENCOUNTER — Other Ambulatory Visit: Payer: Self-pay | Admitting: Neurology

## 2016-07-03 ENCOUNTER — Other Ambulatory Visit: Payer: Self-pay | Admitting: Neurology

## 2016-07-08 NOTE — Telephone Encounter (Signed)
Rx printed, signed, faxed to pharmacy. Pt needs to schedule appt for further refills.

## 2016-07-15 ENCOUNTER — Telehealth: Payer: Self-pay | Admitting: Neurology

## 2016-07-15 MED ORDER — PREGABALIN 25 MG PO CAPS: 25.0000 mg | ORAL_CAPSULE | Freq: Two times a day (BID) | ORAL | 2 refills | Status: DC

## 2016-07-15 NOTE — Telephone Encounter (Signed)
I called patient. The patient is having ongoing headaches, she reports sharp pains in the head. She had been on gabapentin, she indicates that she is not taking this, she is not completely sure why, but she believes that she did not tolerate it. She was placed on Topamax and then Zonegran, these medications caused nausea. I will try low-dose Lyrica for the headache.

## 2016-07-15 NOTE — Telephone Encounter (Signed)
Patient called, states she has nausea, heartburn/reflux, dizziness, head feels like it's going around in circles approximately 1 month.

## 2016-07-15 NOTE — Telephone Encounter (Signed)
Lyrica rx printed, signed and faxed to pt's [place].

## 2016-07-27 ENCOUNTER — Telehealth: Payer: Self-pay | Admitting: Neurology

## 2016-07-27 MED ORDER — GABAPENTIN 100 MG PO CAPS: 100.0000 mg | ORAL_CAPSULE | Freq: Three times a day (TID) | ORAL | 1 refills | Status: DC

## 2016-07-27 MED ORDER — GABAPENTIN (ONCE-DAILY) 300 MG PO TABS: 300.0000 mg | ORAL_TABLET | Freq: Every day | ORAL | 2 refills | Status: DC

## 2016-07-27 NOTE — Addendum Note (Signed)
Addended by: Margette Fast on: 07/27/2016 01:50 PM   Modules accepted: Orders

## 2016-07-27 NOTE — Telephone Encounter (Signed)
Patient is calling back stating Gralise is not covered by her insurance either.

## 2016-07-27 NOTE — Telephone Encounter (Signed)
Patient is calling because her insurance will not cover pregabalin (LYRICA) 25 MG capsule. She would like another medication called in to replace it. Patient  uses [facility] on [address]. Please call patient and advise.

## 2016-07-27 NOTE — Telephone Encounter (Signed)
Insurance would not cover Lyrica, we will try Gralise. I have called in a prescription.

## 2016-07-27 NOTE — Addendum Note (Signed)
Addended by: Margette Fast on: 07/27/2016 09:58 AM   Modules accepted: Orders

## 2016-07-27 NOTE — Telephone Encounter (Signed)
I called patient. The Gralise is also not covered, I will try her on a low dose of gabapentin.

## 2016-08-04 ENCOUNTER — Telehealth: Payer: Self-pay | Admitting: Neurology

## 2016-08-04 NOTE — Telephone Encounter (Signed)
Patient called to advise Dr. Jannifer Franklin that gabapentin (NEURONTIN) 100 MG capsule "works real good, not as many pains in my head, pains in head haven't been as bad, hasn't been severe at all" requests that Dr. Jannifer Franklin keep prescribing this medication. Patient is aware Dr. Jannifer Franklin and Anderson Malta are out of the office until Thursday.

## 2016-08-04 NOTE — Telephone Encounter (Signed)
I will continue the gabapentin, she is not due for a refill yet.

## 2016-09-15 ENCOUNTER — Ambulatory Visit: Payer: Medicare Other | Admitting: Neurology

## 2016-10-02 ENCOUNTER — Telehealth: Payer: Self-pay | Admitting: Neurology

## 2016-10-02 DIAGNOSIS — F319 Bipolar disorder, unspecified: Secondary | ICD-10-CM

## 2016-10-02 NOTE — Telephone Encounter (Signed)
Pt w/ a h/o seizures, bipolar d/o, anxiety and migraines. Last seizure was in July but her phenobarb level was low. She tried several medications for HAs including Topamax, Zonegran and Lyrica but could not tolerate them. Gralise was not covered by insurance. However, gabapentin was very beneficial. She is calling today asking about trying Latuda for bipolar depression. Currently, she has orders for Belsomra, Klonopin, Seroquel and Mirapex. Appears that she has not been taking Lexapro as it has not been ordered since 2015.

## 2016-10-02 NOTE — Telephone Encounter (Signed)
Pt called wants to try Latuda. Says she was in the grocery store and "everything was getting on my nerves". She said "I don't want to do anything, don't want to walk my dog". Please call

## 2016-10-02 NOTE — Telephone Encounter (Signed)
I called patient. The patient is more ear trouble, she wants to have a trial on Latuda for her bipolar disorder. I indicated that she clearly needs to seek attention through a psychiatrist, I'll try to make a referral for her.  The patient has no revisit set up through this office in the future.

## 2016-10-05 ENCOUNTER — Telehealth: Payer: Self-pay | Admitting: Neurology

## 2016-10-05 NOTE — Telephone Encounter (Signed)
Dr. Jannifer Franklin, Dr. Martin Majestic ur does not take patient's insurance . Teresa Lawrence does take her insurance. Patient will see Thayer Headings NP . Please advise if this ok and I will make patient aware.

## 2016-10-05 NOTE — Telephone Encounter (Signed)
Okay [facility]

## 2016-10-05 NOTE — Telephone Encounter (Signed)
Patient is calling stating she does not want to be referred to a psychiatrist.

## 2016-10-05 NOTE — Telephone Encounter (Signed)
Apparently the patient called back indicating that she did not wish to be referred to a psychiatrist.
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
## 2016-10-06 NOTE — Telephone Encounter (Signed)
We will try to make the referral.

## 2016-10-08 ENCOUNTER — Telehealth: Payer: Self-pay

## 2016-10-08 NOTE — Telephone Encounter (Signed)
Agree with nursing assessment, the patient desperately needs to have a psychiatric referral.

## 2016-10-08 NOTE — Telephone Encounter (Signed)
Phone room contacted me and stated that patient called in and states that she is going to harm herself.   I got on the phone with the patient. Patient sounded like she had been crying, and currently sounded tearful. I asked the patient about her intentions. Teresa Lawrence stated that currently she is not having any thoughts of harming self, others, or of suicide. She reported that she has had "manic episodes of rage" and thoughts of harming self during these episodes. Last one was yesterday. Patient stated that she did not feel like she needed to go to the hospital at this time. Again reports that at this time does not want to harm self or others, including "my animals".  She asked what she should do during these times? I advised her that when she feels it building remove herself from the situation and go to a safe place (says that it would be her neighbor's front porch rocking chair). And I advised that when she has the harmful thoughts she needs to call 911, have someone take her to the ED, and of course can call us if needed.  Patient is agreeable to psy referral, I advised her that I will ask our referral team if this can be expedited. I advised her that I will also let Dr. Jannifer Franklin know the situation.  We ended the conversation well. Patient states that she feels better. Thanked [place] for speaking with her. She also shared some happy memories about her pets. Teresa Lawrence again states that she is not having any harmful thoughts. She is aware to call 911 or go to ED if these episodes happen. She will call us back if needed.

## 2016-10-14 NOTE — Telephone Encounter (Signed)
Pt called today said the bi-polar is worse. She is wanting to know the status of referral. I told her it had been put in but was waiting Dr Viona Gilmore decision and he was not in the office today . I advised RN would touch base with her today.

## 2016-10-14 NOTE — Telephone Encounter (Signed)
Referral had to be sent to [facility]. [id number]. Tried to send patient to several places but would not take her insurance.   I have called and spoke to patient and her mother they have telephone number and office address. I relayed to patient if she was not scheduled in next to weeks to call me back . Patient and her mother understood all details.   Relayed go to nearest emergency room if needed, 911 . Patient understood.

## 2016-10-14 NOTE — Telephone Encounter (Signed)
Hinton Dyer- here is this message we talked about, thank you

## 2016-10-23 ENCOUNTER — Encounter (HOSPITAL_COMMUNITY): Payer: Self-pay

## 2016-10-23 NOTE — Telephone Encounter (Signed)
Pt called to advise she has an appt on 2/20. FYI

## 2016-11-06 ENCOUNTER — Other Ambulatory Visit: Payer: Self-pay | Admitting: Neurology

## 2016-11-16 ENCOUNTER — Telehealth: Payer: Self-pay | Admitting: Neurology

## 2016-11-16 NOTE — Telephone Encounter (Addendum)
[employer] [phone] - pt ID#[id number].

## 2016-11-16 NOTE — Telephone Encounter (Addendum)
PA completed and approved by [employer] through 10/18/17.  Returned call to patient to notify her of the approval.

## 2016-11-16 NOTE — Telephone Encounter (Signed)
The patient has been on Belsomra for quite some time, the insurance company indicates that it is not painful the medication currently. This may be an issue of preauthorization, we will need to look into this further. If preauthorization is required, we will do this.

## 2016-11-16 NOTE — Telephone Encounter (Signed)
Patient called in reference to Boley 20mg .  Patient received a letter in the mail that her insurance will no longer be covering this medication.  Patient does have a three month supply, but wants to know if there is a generic or something else she needs to be switched to.  Please call

## 2016-12-08 ENCOUNTER — Ambulatory Visit (HOSPITAL_COMMUNITY): Payer: Medicare Other | Admitting: Psychiatry

## 2016-12-17 ENCOUNTER — Other Ambulatory Visit: Payer: Self-pay | Admitting: Neurology

## 2016-12-18 NOTE — Telephone Encounter (Signed)
Faxed printed/signed rx belsomra to pt pharmacy. [phone]. Received confirmation.

## 2017-01-25 ENCOUNTER — Telehealth: Payer: Self-pay | Admitting: Neurology

## 2017-01-25 NOTE — Telephone Encounter (Signed)
Pt called and would like to know if she could be considered for medication Trintellex, pt said she saw a tv commercial and feels that it may treat her depression.

## 2017-01-25 NOTE — Telephone Encounter (Signed)
Returned call to patient - states she needs to come in for an office visit to follow up for her seizures.  No seizure activity since last seen in the office.  She would like to discuss possible treatment with Trintellix for her depression - she is unsure if she can take this along with her other medications.  She will bring an updated list of her current medications when she comes for her appt.  Offered to schedule appt while on the phone but she will call back after she checks with her mother.

## 2017-01-31 ENCOUNTER — Other Ambulatory Visit: Payer: Self-pay | Admitting: Neurology

## 2017-02-01 ENCOUNTER — Ambulatory Visit: Payer: Medicare Other | Admitting: Neurology

## 2017-02-01 ENCOUNTER — Telehealth: Payer: Self-pay | Admitting: *Deleted
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
## 2017-02-02 ENCOUNTER — Ambulatory Visit: Payer: Self-pay | Admitting: Neurology
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
## 2017-02-02 NOTE — Telephone Encounter (Signed)
Faxed printed/signed rx to pt pharmacy. Fax: [phone]. Received confirmation.

## 2017-02-02 NOTE — Telephone Encounter (Signed)
Called and spoke with mother. Patient not available to speak with. No HIPPA form on file. Unable to speak with mother. Mother will have her call back in the morning to get her appt r/s.  **Per Dr Jannifer Franklin, pt okay to see NP if she calls to schedule

## 2017-02-02 NOTE — Telephone Encounter (Signed)
Left VM  mssg notifying pt that power is now back on at [facility] if she would like to call back and keep her appt for this afternoon.
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
## 2017-02-03 NOTE — Telephone Encounter (Signed)
Noted, thank you

## 2017-02-03 NOTE — Telephone Encounter (Signed)
Patient called office and has been scheduled to see Megan on 02/04/17 @ 1:30pm.

## 2017-02-04 ENCOUNTER — Ambulatory Visit: Payer: Medicare Other | Admitting: Adult Health

## 2017-02-09 ENCOUNTER — Encounter (HOSPITAL_COMMUNITY): Payer: Self-pay

## 2017-02-09 ENCOUNTER — Emergency Department (HOSPITAL_COMMUNITY)
Admission: EM | Admit: 2017-02-09 | Discharge: 2017-02-09 | Disposition: A | Discharge status: Home / Self Care | Payer: Medicare Other | Attending: Emergency Medicine | Admitting: Emergency Medicine

## 2017-02-09 ENCOUNTER — Emergency Department (HOSPITAL_COMMUNITY): Payer: Medicare Other

## 2017-02-09 DIAGNOSIS — R1084 Generalized abdominal pain: Secondary | ICD-10-CM | POA: Insufficient documentation

## 2017-02-09 DIAGNOSIS — R112 Nausea with vomiting, unspecified: Secondary | ICD-10-CM | POA: Diagnosis not present

## 2017-02-09 DIAGNOSIS — Z79899 Other long term (current) drug therapy: Secondary | ICD-10-CM | POA: Diagnosis not present

## 2017-02-09 DIAGNOSIS — R1013 Epigastric pain: Secondary | ICD-10-CM | POA: Diagnosis present

## 2017-02-09 DIAGNOSIS — Z87891 Personal history of nicotine dependence: Secondary | ICD-10-CM | POA: Insufficient documentation

## 2017-02-09 LAB — CBC
HCT: 42.1 % (ref 36.0–46.0)
HEMOGLOBIN: 13.4 g/dL (ref 12.0–15.0)
MCH: 28.8 pg (ref 26.0–34.0)
MCHC: 31.8 g/dL (ref 30.0–36.0)
MCV: 90.3 fL (ref 78.0–100.0)
Platelets: 310 10*3/uL (ref 150–400)
RBC: 4.66 MIL/uL (ref 3.87–5.11)
RDW: 13.4 % (ref 11.5–15.5)
WBC: 11.3 10*3/uL — AB (ref 4.0–10.5)

## 2017-02-09 LAB — COMPREHENSIVE METABOLIC PANEL
ALK PHOS: 105 U/L (ref 38–126)
ALT: 20 U/L (ref 14–54)
ANION GAP: 9 (ref 5–15)
AST: 18 U/L (ref 15–41)
Albumin: 3.7 g/dL (ref 3.5–5.0)
BILIRUBIN TOTAL: 0.6 mg/dL (ref 0.3–1.2)
BUN: 9 mg/dL (ref 6–20)
CALCIUM: 9 mg/dL (ref 8.9–10.3)
CO2: 26 mmol/L (ref 22–32)
CREATININE: 0.67 mg/dL (ref 0.44–1.00)
Chloride: 103 mmol/L (ref 101–111)
Glucose, Bld: 107 mg/dL — ABNORMAL HIGH (ref 65–99)
Potassium: 3.8 mmol/L (ref 3.5–5.1)
SODIUM: 138 mmol/L (ref 135–145)
Total Protein: 7.8 g/dL (ref 6.5–8.1)

## 2017-02-09 LAB — URINALYSIS, ROUTINE W REFLEX MICROSCOPIC
Bacteria, UA: NONE SEEN
Bilirubin Urine: NEGATIVE
Glucose, UA: NEGATIVE mg/dL
KETONES UR: NEGATIVE mg/dL
Leukocytes, UA: NEGATIVE
NITRITE: NEGATIVE
PH: 6 (ref 5.0–8.0)
Protein, ur: NEGATIVE mg/dL
Specific Gravity, Urine: 1.017 (ref 1.005–1.030)

## 2017-02-09 LAB — LIPASE, BLOOD: Lipase: 20 U/L (ref 11–51)

## 2017-02-09 MED ORDER — IOPAMIDOL (ISOVUE-300) INJECTION 61%
INTRAVENOUS | Status: AC
  Administered 2017-02-09: 100 mL
  Filled 2017-02-09: qty 100

## 2017-02-09 MED ORDER — ONDANSETRON HCL 4 MG PO TABS: 4.0000 mg | ORAL_TABLET | Freq: Four times a day (QID) | ORAL | 0 refills | Status: DC | PRN

## 2017-02-09 MED ORDER — TRAMADOL HCL 50 MG PO TABS: 50.0000 mg | ORAL_TABLET | Freq: Four times a day (QID) | ORAL | 0 refills | Status: DC | PRN

## 2017-02-09 MED ORDER — ONDANSETRON 4 MG PO TBDP
4.0000 mg | ORAL_TABLET | Freq: Once | ORAL | Status: AC | PRN
  Administered 2017-02-09: 4 mg via ORAL

## 2017-02-09 MED ORDER — ONDANSETRON 4 MG PO TBDP
ORAL_TABLET | ORAL | Status: AC
  Filled 2017-02-09: qty 1

## 2017-02-09 MED ORDER — GI COCKTAIL ~~LOC~~
30.0000 mL | Freq: Once | ORAL | Status: AC
  Administered 2017-02-09: 30 mL via ORAL
  Filled 2017-02-09: qty 30

## 2017-02-09 MED ORDER — SUCRALFATE 1 GM/10ML PO SUSP: 1.0000 g | Freq: Three times a day (TID) | ORAL | 0 refills | Status: DC

## 2017-02-09 MED ORDER — SODIUM CHLORIDE 0.9 % IV BOLUS (SEPSIS)
500.0000 mL | Freq: Once | INTRAVENOUS | Status: AC
  Administered 2017-02-09: 500 mL via INTRAVENOUS

## 2017-02-09 MED ORDER — MORPHINE SULFATE (PF) 4 MG/ML IV SOLN
4.0000 mg | Freq: Once | INTRAVENOUS | Status: AC
  Administered 2017-02-09: 4 mg via INTRAVENOUS
  Filled 2017-02-09: qty 1

## 2017-02-09 NOTE — ED Notes (Signed)
Pt stable, ambulatory, states understanding of discharge instructions 

## 2017-02-09 NOTE — ED Triage Notes (Signed)
Pt presents from PCP office for abd pelvis CT and evaluation of possible appendicitis. Pt reports woke up around 0230 AM with N/V and severe RLQ abd pain.

## 2017-02-09 NOTE — ED Provider Notes (Signed)
[facility] DEPT Provider Note   CSN: [id number] Arrival date & time: 02/09/17  1303     History   Chief Complaint Chief Complaint  Patient presents with  . Abdominal Pain    HPI Teresa Lawrence is a 51 y.o. female.  HPI Patient presents with abdominal pain that started at 2:30 AM. Associated with multiple episodes of vomiting (4). Had french fries to eat tonight before symptoms started. Pain is mostly in the epigastric and right upper quadrant and radiates around to her back. She also has diffuse abdominal pain including pain in the right lower quadrant. She was seen by her primary physician who was concerned for appendicitis and sent to the emergency department. She denies any urinary symptoms including dysuria, frequency or hematuria. She's had no diarrhea. No blood in the vomit. Has had subjective fever and chills. No vaginal symptoms. Previous hysterectomy but no other abdominal surgeries. Past Medical History:  Diagnosis Date  . Abnormal Pap smear   . Anxiety   . Bipolar disorder ([place])   . Dysmenorrhea   . Dysplasia   . Dysrhythmia    episodes of palpitations  . Dysuria   . H/O head injury   . Headache(784.0)   . High blood cholesterol   . HSV-2 infection   . Hx of hypercholesterolemia   . Hx of ovarian cyst   . Hx of seizure disorder   . Mass of ovary    left side  . Menorrhagia   . Mental retardation    mild per Dr. notes in [facility]  . Mild mental retardation   . Perimenopausal vasomotor symptoms   . PMS (premenstrual syndrome)   . PMS (premenstrual syndrome)   . PMS (premenstrual syndrome)   . RLS (restless legs syndrome)   . Seizures ([place])   . Uterine mass   . Vaginal yeast infection 2007   recurrent   . Yeast vaginitis     Patient Active Problem List   Diagnosis Date Noted  . Common migraine with intractable migraine 05/14/2016  . Insomnia 03/17/2013  . Headache(784.0) 02/23/2013  . Hx of yeast infection 04/20/2012  . Hx of menorrhagia 04/20/2012   . 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 Bipolar disorder ([place]) 04/20/2012  . Hx of menorrhagia 04/20/2012  . Hx of ovarian cyst 04/20/2012  . Epilepsy ([place]) 04/20/2012  . Hx of seizure disorder 04/20/2012  . 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 Hx of dysmenorrhea 04/20/2012  . 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 Hx of hypercholesterolemia 04/20/2012  . Hx of PMS (premenstrual syndrome) 04/20/2012  . Encounter for therapeutic drug monitoring 05/01/2011    Past Surgical History:  Procedure Laterality Date  . ABDOMINAL HYSTERECTOMY  2013  . COLPOSCOPY    . CYSTOSCOPY Bilateral 05/30/2013   Procedure: CYSTOSCOPY;  Surgeon: Betsy Coder, MD;  Location: [facility];  Service: Gynecology;  Laterality: Bilateral;  . DILATION AND CURETTAGE OF UTERUS    . ENDOMETRIAL BIOPSY    . HYSTEROSCOPY W/ ENDOMETRIAL ABLATION    . LAPAROSCOPIC HYSTERECTOMY Right 05/30/2013   Procedure: HYSTERECTOMY TOTAL LAPAROSCOPIC;  Surgeon: Betsy Coder, MD;  Location: [facility];  Service: Gynecology;  Laterality: Right;  Right Salpingectomy  . POLYPECTOMY    . WISDOM TOOTH EXTRACTION      OB History    No data available       Home Medications    Prior to Admission medications   Medication Sig Start Date End Date Taking? Authorizing Provider  acyclovir (ZOVIRAX) 200 MG capsule Take 200 mg by mouth 3 (three) times daily as needed (fever blisters).     Historical Provider, MD  atorvastatin (LIPITOR) 80 MG tablet Take 80 mg by mouth daily. 03/08/14   Historical Provider, MD  BELSOMRA 20 MG TABS TAKE ONE TABLET BY MOUTH AT BEDTIME AS NEEDED 12/18/16   [name], MD  clonazePAM (KLONOPIN) 2 MG tablet TAKE ONE TABLET BY MOUTH TWICE DAILY AND TAKE TWO TABLETS BY MOUTH AT BEDTIME 07/07/16   [name], MD  DIPHENHYDRAMINE HCL, SLEEP, PO Take 1 tablet by mouth at bedtime as needed (for sleep).    Historical Provider, MD  escitalopram (LEXAPRO) 10 MG tablet TAKE ONE TABLET BY MOUTH ONCE DAILY  12/28/13   [name], MD  esomeprazole (NEXIUM) 40 MG capsule Take 40 mg by mouth 2 (two) times daily before a meal.    Historical Provider, MD  furosemide (LASIX) 40 MG tablet Take 40 mg by mouth daily as needed. 07/24/13   Historical Provider, MD  gabapentin (NEURONTIN) 100 MG capsule TAKE ONE CAPSULE BY MOUTH THREE TIMES DAILY 11/06/16   Larey Seat, MD  hydrocortisone cream 1 % Apply 1 application topically as needed (itching).    Historical Provider, MD  ibuprofen (ADVIL,MOTRIN) 600 MG tablet Take 1 tablet (600 mg total) by mouth every 6 (six) hours as needed (mild pain). 05/31/13   Earnstine Regal, PA-C  loperamide (IMODIUM) 2 MG capsule Take 2 mg by mouth 4 (four) times daily as needed for diarrhea or loose stools.    Historical Provider, MD  Multiple Vitamins-Minerals (MULTIVITAMIN WITH MINERALS) tablet Take 1 tablet by mouth daily.    Historical Provider, MD  neomycin-bacitracin-polymyxin (NEOSPORIN) [phone] ointment Apply 1 application topically as needed (for cuts).    Historical Provider, MD  ondansetron (ZOFRAN) 4 MG tablet Take 1 tablet (4 mg total) by mouth every 6 (six) hours as needed for nausea or vomiting. 02/09/17   Julianne Rice, MD  PHENobarbital (LUMINAL) 64.8 MG tablet TAKE ONE TABLET BY MOUTH TWICE DAILY 02/02/17   [name], MD  pramipexole (MIRAPEX) 0.25 MG tablet TAKE ONE TABLET BY MOUTH THREE TIMES DAILY 06/26/16   [name], MD  PRESCRIPTION MEDICATION Place 1 suppository vaginally daily as needed (for yeast infections). Boric Acid Suppository    Historical Provider, MD  promethazine (PHENERGAN) 25 MG tablet Take 25 mg by mouth every 6 (six) hours as needed for nausea or vomiting.    Historical Provider, MD  QUEtiapine (SEROQUEL) 100 MG tablet TAKE ONE & ONE-HALF TABLETS BY MOUTH AT BEDTIME 06/29/16   [name], MD  SF 1.1 % GEL dental gel Place 1.1 mLs onto teeth. 08/31/13   Historical Provider, MD  sucralfate (CARAFATE) 1 GM/10ML suspension  Take 10 mLs (1 g total) by mouth 4 (four) times daily -  with meals and at bedtime. 02/09/17   Julianne Rice, MD  traMADol (ULTRAM) 50 MG tablet Take 1 tablet (50 mg total) by mouth every 6 (six) hours as needed for severe pain. 02/09/17   Julianne Rice, MD  triamcinolone (KENALOG) 0.1 % paste Place 1 application onto teeth as needed (for mouth ulcers).  03/02/13   Historical Provider, MD    Family History Family History  Problem Relation Age of Onset  . Heart attack Father     Social History Social History  Substance Use Topics  . Smoking status: Former Research scientist (life sciences)  . Smokeless tobacco: Never Used  Comment: Quit 10 years ago.  . Alcohol use No     Allergies   Requip [ropinirole hcl]; Topamax [topiramate]; and Zonegran [zonisamide]   Review of Systems Review of Systems  Constitutional: Positive for chills and fever.  Respiratory: Negative for cough and shortness of breath.   Cardiovascular: Negative for chest pain.  Gastrointestinal: Positive for abdominal pain, nausea and vomiting. Negative for blood in stool, constipation and diarrhea.  Genitourinary: Negative for difficulty urinating, dysuria, flank pain, frequency, hematuria, pelvic pain, vaginal bleeding and vaginal discharge.  Musculoskeletal: Positive for back pain. Negative for myalgias, neck pain and neck stiffness.  Neurological: Negative for dizziness, weakness, light-headedness, numbness and headaches.  All other systems reviewed and are negative.    Physical Exam Updated Vital Signs BP 108/65   Pulse 86   Temp 100.2 F (37.9 C) (Oral)   Resp 18   LMP 05/11/2013   SpO2 98%   Physical Exam  Constitutional: She is oriented to person, place, and time. She appears well-developed and well-nourished. No distress.  HENT:  Head: Normocephalic and atraumatic.  Mouth/Throat: Oropharynx is clear and moist. No oropharyngeal exudate.  Eyes: EOM are normal. Pupils are equal, round, and reactive to light.  Neck:  Normal range of motion. Neck supple.  Cardiovascular: Normal rate and regular rhythm.  Exam reveals no gallop and no friction rub.   No murmur heard. Pulmonary/Chest: Effort normal and breath sounds normal.  Abdominal: Soft. Bowel sounds are normal. There is tenderness. There is no rebound and no guarding.  Diffuse abdominal tenderness most notably in the epigastric and right upper quadrants. Patient does have right lower quadrant tenderness but no rebound or guarding is present. Negative Rovsing sign.  Musculoskeletal: Normal range of motion. She exhibits no edema or tenderness.  No CVA tenderness. No midline thoracic or lumbar tenderness.  Neurological: She is alert and oriented to person, place, and time.  Moving all extremities without focal weakness. Sensation intact.  Skin: Skin is warm and dry. Capillary refill takes less than 2 seconds. No rash noted. No erythema.  Psychiatric: She has a normal mood and affect. Her behavior is normal.  Nursing note and vitals reviewed.    ED Treatments / Results  Labs (all labs ordered are listed, but only abnormal results are displayed) Labs Reviewed  COMPREHENSIVE METABOLIC PANEL - Abnormal; Notable for the following:       Result Value   Glucose, Bld 107 (*)    All other components within normal limits  CBC - Abnormal; Notable for the following:    WBC 11.3 (*)    All other components within normal limits  URINALYSIS, ROUTINE W REFLEX MICROSCOPIC - Abnormal; Notable for the following:    Hgb urine dipstick LARGE (*)    Squamous Epithelial / LPF 0-5 (*)    All other components within normal limits  LIPASE, BLOOD    EKG  EKG Interpretation None       Radiology Ct Abdomen Pelvis W Contrast  Result Date: 02/09/2017 CLINICAL DATA:  Abdominal pain with nausea and vomiting starting at 2 a.m. Prior hysterectomy. EXAM: CT ABDOMEN AND PELVIS WITH CONTRAST TECHNIQUE: Multidetector CT imaging of the abdomen and pelvis was performed using  the standard protocol following bolus administration of intravenous contrast. CONTRAST:  168mL ISOVUE-300 IOPAMIDOL (ISOVUE-300) INJECTION 61% COMPARISON:  None. FINDINGS: Lower chest: Clear lung bases.  Borderline cardiomegaly. Hepatobiliary: Mild hepatomegaly at 18.0 craniocaudal. No focal liver lesion. Normal gallbladder, without biliary ductal dilatation. Pancreas: Normal, without mass  or ductal dilatation. Spleen: Normal in size, without focal abnormality. Adrenals/Urinary Tract: Normal adrenal glands. Normal kidneys, without hydronephrosis. Normal urinary bladder. Stomach/Bowel: Normal stomach, without wall thickening. Normal colon, appendix, and terminal ileum. Normal small bowel. Vascular/Lymphatic: Aortic and branch vessel atherosclerosis. No abdominopelvic adenopathy. Reproductive: Hysterectomy.  No adnexal mass. Other: No significant free fluid. Musculoskeletal: No acute osseous abnormality. Degenerate disc disease at L4-5 and L5-S1. IMPRESSION: 1.  No acute process in the abdomen or pelvis. 2. Mild hepatomegaly Electronically Signed   By: Abigail Miyamoto M.D.   On: 02/09/2017 19:19    Procedures Procedures (including critical care time)  Medications Ordered in ED Medications  ondansetron (ZOFRAN-ODT) disintegrating tablet 4 mg (4 mg Oral Given 02/09/17 1342)  morphine 4 MG/ML injection 4 mg (4 mg Intravenous Given 02/09/17 1749)  sodium chloride 0.9 % bolus 500 mL (0 mLs Intravenous Stopped 02/09/17 1832)  iopamidol (ISOVUE-300) 61 % injection (100 mLs  Contrast Given 02/09/17 1849)  gi cocktail (Maalox,Lidocaine,Donnatal) (30 mLs Oral Given 02/09/17 2009)     Initial Impression / Assessment and Plan / ED Course  I have reviewed the triage vital signs and the nursing notes.  Pertinent labs & imaging results that were available during my care of the patient were reviewed by me and considered in my medical decision making (see chart for details).     CT abdomen without acute findings. No  evidence of surgical emergency. Patient has had no vomiting in the emergency department. Tolerating liquids without difficulty. Suspect gastritis versus gastroenteritis. Advised to avoid NSAIDs. Patient is on a PPI. Will give prescription for Carafate and Zofran. Advised to follow-up with a gastrologist should her symptoms persist. Return precautions given.  Final Clinical Impressions(s) / ED Diagnoses   Final diagnoses:  Generalized abdominal pain  Moderate nausea and vomiting    New Prescriptions New Prescriptions   ONDANSETRON (ZOFRAN) 4 MG TABLET    Take 1 tablet (4 mg total) by mouth every 6 (six) hours as needed for nausea or vomiting.   SUCRALFATE (CARAFATE) 1 GM/10ML SUSPENSION    Take 10 mLs (1 g total) by mouth 4 (four) times daily -  with meals and at bedtime.   TRAMADOL (ULTRAM) 50 MG TABLET    Take 1 tablet (50 mg total) by mouth every 6 (six) hours as needed for severe pain.     Julianne Rice, MD 02/09/17 2055

## 2017-02-09 NOTE — Discharge Instructions (Signed)
Avoid ibuprofen and all NSAIDs. Avoid spicy and greasy foods. Slowly advance diet from liquids to bland diet. Follow-up with gastroenterology should your abdominal pain persist.

## 2017-03-17 ENCOUNTER — Other Ambulatory Visit: Payer: Self-pay | Admitting: Family Medicine

## 2017-03-17 DIAGNOSIS — Z1231 Encounter for screening mammogram for malignant neoplasm of breast: Secondary | ICD-10-CM

## 2017-03-26 ENCOUNTER — Telehealth: Payer: Self-pay | Admitting: Neurology

## 2017-03-26 NOTE — Telephone Encounter (Signed)
Patient called and requested to speak with someone regarding some issues she is having with wearing jewelry. She says that she is wearing real pieces but they are turning her skin black and she wants to know if this could be because of her medication. Please call and advise.

## 2017-03-26 NOTE — Telephone Encounter (Signed)
I called patient. The patient is wearing stainless steel rings and watches that seemed to cause skin irritation in chart discoloration. This probably is not related to medication, may be related to an allergy to one of the metal alloys in the jewelry.

## 2017-03-30 ENCOUNTER — Ambulatory Visit
Admission: RE | Admit: 2017-03-30 | Discharge: 2017-03-30 | Disposition: A | Discharge status: Home / Self Care | Payer: Medicare Other | Source: Ambulatory Visit | Attending: Family Medicine | Admitting: Family Medicine

## 2017-03-30 DIAGNOSIS — Z1231 Encounter for screening mammogram for malignant neoplasm of breast: Secondary | ICD-10-CM

## 2017-04-25 ENCOUNTER — Other Ambulatory Visit: Payer: Self-pay | Admitting: Neurology

## 2017-04-26 ENCOUNTER — Other Ambulatory Visit: Payer: Self-pay | Admitting: Neurology

## 2017-04-26 ENCOUNTER — Telehealth: Payer: Self-pay | Admitting: Neurology

## 2017-04-26 MED ORDER — PRAMIPEXOLE DIHYDROCHLORIDE 0.25 MG PO TABS: ORAL_TABLET | ORAL | 5 refills | Status: DC

## 2017-04-26 NOTE — Telephone Encounter (Signed)
Phenobarbital faxed and confirmed to [facility] at [phone].

## 2017-04-26 NOTE — Telephone Encounter (Signed)
Patient calling to get a Rx for pramipexole (MIRAPEX) 0.25 MG tablet called to [facility] on [address]. She wants to know if the dosage can be upped because her legs are really bothering her.

## 2017-04-26 NOTE — Addendum Note (Signed)
Addended by: [name] on: 04/26/2017 05:16 PM   Modules accepted: Orders

## 2017-04-26 NOTE — Telephone Encounter (Signed)
I called patient. The Mirapex initially seemed to help, she is now having increased symptoms, we will go to 0.25 mg taking 1 twice during the day and 2 at night.

## 2017-05-20 NOTE — Telephone Encounter (Signed)
I called the patient. The increase in the Mirapex recently did not help her, we can go up on the gabapentin taking 200 mg 3 times daily and continue to go up on the dose if this is somewhat helpful.  A recent CBC done shows no evidence of anemia, MCV is around 90, no evidence of a iron deficiency.

## 2017-05-20 NOTE — Telephone Encounter (Signed)
Pt called office back. I relayed CW,MD message below. Pt wanted to know if she should continue mirapex or stop it. I placed pt on hold. Spoke with CW,MD. He advised for pt to keep taking mirapex. Do not stop. I relayed this to pt.   She will try taking  gabapentin 100mg  (2 capsules 3x/day) and call back in a week or two to let up know if its helping. We will then call in rx at that time if its effective.  She verbalized understanding and read back directions correctly.

## 2017-05-20 NOTE — Telephone Encounter (Signed)
Patient called office in reference to pramipexole (MIRAPEX) 0.25 MG tablet.  Patient states medication is not working at all legs are really bothering her and unable to sit still.  Patient would like to see if she can be prescribed another medication instead.  [place].

## 2017-06-03 MED ORDER — GABAPENTIN 100 MG PO CAPS: 100.0000 mg | ORAL_CAPSULE | Freq: Three times a day (TID) | ORAL | 3 refills | Status: DC

## 2017-06-03 NOTE — Telephone Encounter (Addendum)
Pt called the clinic the gabapentin is working and would like a new RX sent to [facility]. She would like a refill for promethazine (PHENERGAN) 25 MG tablet sent to same pharmacy.

## 2017-06-03 NOTE — Addendum Note (Signed)
Addended by: [name] on: 06/03/2017 09:37 AM   Modules accepted: Orders

## 2017-06-03 NOTE — Telephone Encounter (Signed)
The patient is requesting a refill on gabapentin, not promethazine. She claims that she takes promethazine on occasion, this can worsen restless leg syndrome I informed her of this.  I will call in a prescription for the gabapentin.

## 2017-06-23 ENCOUNTER — Other Ambulatory Visit: Payer: Self-pay | Admitting: Neurology

## 2017-06-24 ENCOUNTER — Other Ambulatory Visit: Payer: Self-pay | Admitting: Neurology

## 2017-06-24 NOTE — Telephone Encounter (Signed)
Faxed printed/signed rx clonazepam to [facility] at [phone]. Received confirmation.

## 2017-06-26 ENCOUNTER — Other Ambulatory Visit: Payer: Self-pay | Admitting: Neurology

## 2017-06-28 ENCOUNTER — Telehealth: Payer: Self-pay | Admitting: *Deleted

## 2017-06-28 NOTE — Telephone Encounter (Addendum)
Pt called reg refill. She has 3 tabs left. Pt was advised Dr Jannifer Franklin approved medication, she is aware she will need to keep her appt 11/26/17. Pt will check back with [facility] this afternoon.  FYI

## 2017-06-28 NOTE — Telephone Encounter (Signed)
Received fax request to send refill to [place], [place].

## 2017-06-28 NOTE — Telephone Encounter (Signed)
Rx phenobarbital was faxed to [facility] at [phone]. Received fax confirmation.

## 2017-06-28 NOTE — Telephone Encounter (Signed)
The patient has scheduled appointment, we will refill the phenobarbital.

## 2017-06-29 ENCOUNTER — Telehealth: Payer: Self-pay | Admitting: *Deleted

## 2017-06-29 NOTE — Telephone Encounter (Signed)
Rx clonazepam printed on 02/05/16 destroyed d/t patient never picking up from the office.

## 2017-07-07 ENCOUNTER — Other Ambulatory Visit: Payer: Self-pay | Admitting: Neurology

## 2017-07-08 ENCOUNTER — Telehealth: Payer: Self-pay | Admitting: Neurology

## 2017-07-08 NOTE — Telephone Encounter (Signed)
Please see prior telephone note. The patient picked up her last prescription on 06/18/2017. If she is out of the medication she has been taking more medication than is prescribed. She will have to wait until the end of September in order to get the medication.

## 2017-07-08 NOTE — Telephone Encounter (Signed)
Faxed printed/signed rx belsomra to [facility] on [address] at [phone]. Received fax confirmation.

## 2017-07-08 NOTE — Telephone Encounter (Signed)
I called the patient. The patient last date of a prescription for Belsomra on 06/18/2017. It is too early for her to get another prescription filled, she will have to wait to the end of September in order to get the prescription.

## 2017-07-08 NOTE — Telephone Encounter (Signed)
Called and spoke with patient. Advised I faxed rx belsomra to pharmacy as requested. She stated she had a f/u on 11/26/17 and r/s for 12/23/17 d/t previous appt being too early in the day. Advised she needed to come in sooner than that for a f/u since it has been over a year since seen.  Made appt for 07/26/17 at 12pm, check in 1130am.  She verbalized understanding.

## 2017-07-08 NOTE — Telephone Encounter (Signed)
Pt states re: the BELSOMRA 20 MG TABS the pharmacy [facility] ([address] [place] [phone] (Phone) [phone] (Fax)    refused to fill it, they told her it is too soon.  Pt is asking to be called

## 2017-07-08 NOTE — Telephone Encounter (Signed)
Pt called in said pharmacy said it was too early to be filled. She is requesting RN call pharmacy. Pt said she out of the medication. Please call

## 2017-07-08 NOTE — Telephone Encounter (Signed)
Pt called to check on her appt. Said she is having problems with her memory. Can't remember simple things. She is also asking for refill for BELSOMRA 20 MG TABS sent [facility]. Please call

## 2017-07-15 ENCOUNTER — Emergency Department (HOSPITAL_COMMUNITY)
Admission: EM | Admit: 2017-07-15 | Discharge: 2017-07-15 | Disposition: A | Discharge status: Home / Self Care | Payer: Medicare Other | Attending: Emergency Medicine | Admitting: Emergency Medicine

## 2017-07-15 ENCOUNTER — Encounter (HOSPITAL_COMMUNITY): Payer: Self-pay | Admitting: Emergency Medicine

## 2017-07-15 ENCOUNTER — Emergency Department (HOSPITAL_BASED_OUTPATIENT_CLINIC_OR_DEPARTMENT_OTHER): Admit: 2017-07-15 | Discharge: 2017-07-15 | Disposition: A | Discharge status: Home / Self Care | Payer: Medicare Other

## 2017-07-15 DIAGNOSIS — R2241 Localized swelling, mass and lump, right lower limb: Secondary | ICD-10-CM | POA: Diagnosis not present

## 2017-07-15 DIAGNOSIS — Z87891 Personal history of nicotine dependence: Secondary | ICD-10-CM | POA: Insufficient documentation

## 2017-07-15 DIAGNOSIS — Z79899 Other long term (current) drug therapy: Secondary | ICD-10-CM | POA: Diagnosis not present

## 2017-07-15 DIAGNOSIS — M7989 Other specified soft tissue disorders: Secondary | ICD-10-CM | POA: Diagnosis not present

## 2017-07-15 NOTE — Progress Notes (Signed)
*  PRELIMINARY RESULTS* Vascular Ultrasound RIght lower extremity venous duplex has been completed.  Preliminary findings: No evidence of deep vein thrombosis or baker's cyst in the right lower extremity.   Teresa Lawrence 07/15/2017, 2:47 PM

## 2017-07-15 NOTE — ED Provider Notes (Signed)
[facility] DEPT Provider Note   CSN: [id number] Arrival date & time: 07/15/17  1258     History   Chief Complaint Chief Complaint  Patient presents with  . Leg Swelling    HPI MERCEDIES GANESH is a 51 y.o. female.  Patient presents with several days of worsening right lower extremity swelling and tenderness. Patient went to see her primary care physician today who sent her to the emergency department to rule out a blood clot in the leg. Patient denies any chest pain or shortness of breath. She has not had any numbness or tingling of the extremity. No weakness or difficulty walking. No treatments prior to arrival. No injuries or falls recently to the area. States that she thought she got bitten by a spider a few days ago but denies any redness or bumps on the leg. Patient denies doing a lot of standing. Onset of symptoms acute. Course is constant. Nothing makes symptoms better or worse.      Past Medical History:  Diagnosis Date  . Abnormal Pap smear   . Anxiety   . Bipolar disorder ([place])   . Dysmenorrhea   . Dysplasia   . Dysrhythmia    episodes of palpitations  . Dysuria   . H/O head injury   . Headache(784.0)   . High blood cholesterol   . HSV-2 infection   . Hx of hypercholesterolemia   . Hx of ovarian cyst   . Hx of seizure disorder   . Mass of ovary    left side  . Menorrhagia   . Mental retardation    mild per Dr. notes in [facility]  . Mild mental retardation   . Perimenopausal vasomotor symptoms   . PMS (premenstrual syndrome)   . PMS (premenstrual syndrome)   . PMS (premenstrual syndrome)   . RLS (restless legs syndrome)   . Seizures ([place])   . Uterine mass   . Vaginal yeast infection 2007   recurrent   . Yeast vaginitis     Patient Active Problem List   Diagnosis Date Noted  . Common migraine with intractable migraine 05/14/2016  . Insomnia 03/17/2013  . Headache(784.0) 02/23/2013  . Hx of yeast infection 04/20/2012  . Hx of menorrhagia 04/20/2012   . 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 Bipolar disorder ([place]) 04/20/2012  . Hx of menorrhagia 04/20/2012  . Hx of ovarian cyst 04/20/2012  . Epilepsy ([place]) 04/20/2012  . Hx of seizure disorder 04/20/2012  . 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 Hx of dysmenorrhea 04/20/2012  . 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 Hx of hypercholesterolemia 04/20/2012  . Hx of PMS (premenstrual syndrome) 04/20/2012  . Encounter for therapeutic drug monitoring 05/01/2011    Past Surgical History:  Procedure Laterality Date  . ABDOMINAL HYSTERECTOMY  2013  . COLPOSCOPY    . CYSTOSCOPY Bilateral 05/30/2013   Procedure: CYSTOSCOPY;  Surgeon: Betsy Coder, MD;  Location: [facility];  Service: Gynecology;  Laterality: Bilateral;  . DILATION AND CURETTAGE OF UTERUS    . ENDOMETRIAL BIOPSY    . HYSTEROSCOPY W/ ENDOMETRIAL ABLATION    . LAPAROSCOPIC HYSTERECTOMY Right 05/30/2013   Procedure: HYSTERECTOMY TOTAL LAPAROSCOPIC;  Surgeon: Betsy Coder, MD;  Location: [facility];  Service: Gynecology;  Laterality: Right;  Right Salpingectomy  . POLYPECTOMY    . WISDOM TOOTH EXTRACTION      OB History    No data available       Home Medications  Prior to Admission medications   Medication Sig Start Date End Date Taking? Authorizing Provider  acyclovir (ZOVIRAX) 200 MG capsule Take 200 mg by mouth 3 (three) times daily as needed (fever blisters).     [provider]  atorvastatin (LIPITOR) 80 MG tablet Take 80 mg by mouth daily. 03/08/14   [provider]  BELSOMRA 20 MG TABS TAKE 1 TABLET BY MOUTH ONCE DAILY AT BEDTIME AS NEEDED 07/08/17   [name], MD  clonazePAM (KLONOPIN) 2 MG tablet TAKE ONE TABLET BY MOUTH TWICE DAILY AND TWO TABLETS AT BEDTIME 06/24/17   [name], MD  DIPHENHYDRAMINE HCL, SLEEP, PO Take 1 tablet by mouth at bedtime as needed (for sleep).    [provider]  escitalopram (LEXAPRO) 10 MG tablet TAKE ONE TABLET BY MOUTH ONCE DAILY  12/28/13   [name], MD  esomeprazole (NEXIUM) 40 MG capsule Take 40 mg by mouth 2 (two) times daily before a meal.    [provider]  furosemide (LASIX) 40 MG tablet Take 40 mg by mouth daily as needed. 07/24/13   [provider]  gabapentin (NEURONTIN) 100 MG capsule Take 1 capsule (100 mg total) by mouth 3 (three) times daily. 06/03/17   [name], MD  hydrocortisone cream 1 % Apply 1 application topically as needed (itching).    [provider]  ibuprofen (ADVIL,MOTRIN) 600 MG tablet Take 1 tablet (600 mg total) by mouth every 6 (six) hours as needed (mild pain). 05/31/13   Earnstine Regal, PA-C  loperamide (IMODIUM) 2 MG capsule Take 2 mg by mouth 4 (four) times daily as needed for diarrhea or loose stools.    [provider]  Multiple Vitamins-Minerals (MULTIVITAMIN WITH MINERALS) tablet Take 1 tablet by mouth daily.    [provider]  neomycin-bacitracin-polymyxin (NEOSPORIN) [phone] ointment Apply 1 application topically as needed (for cuts).    [provider]  ondansetron (ZOFRAN) 4 MG tablet Take 1 tablet (4 mg total) by mouth every 6 (six) hours as needed for nausea or vomiting. 02/09/17   Julianne Rice, MD  PHENobarbital (LUMINAL) 64.8 MG tablet TAKE 1 TABLET BY MOUTH TWICE DAILY 06/28/17   [name], MD  pramipexole (MIRAPEX) 0.25 MG tablet 1 tablet twice during the day, 2 tablets at night 04/26/17   [name], MD  PRESCRIPTION MEDICATION Place 1 suppository vaginally daily as needed (for yeast infections). Boric Acid Suppository    [provider]  promethazine (PHENERGAN) 25 MG tablet Take 25 mg by mouth every 6 (six) hours as needed for nausea or vomiting.    [provider]  QUEtiapine (SEROQUEL) 100 MG tablet TAKE ONE & ONE-HALF TABLETS BY MOUTH AT BEDTIME 06/29/16   [name], MD  SF 1.1 % GEL dental gel Place 1.1 mLs onto teeth. 08/31/13   [provider]    sucralfate (CARAFATE) 1 GM/10ML suspension Take 10 mLs (1 g total) by mouth 4 (four) times daily -  with meals and at bedtime. 02/09/17   Julianne Rice, MD  traMADol (ULTRAM) 50 MG tablet Take 1 tablet (50 mg total) by mouth every 6 (six) hours as needed for severe pain. 02/09/17   Julianne Rice, MD  triamcinolone (KENALOG) 0.1 % paste Place 1 application onto teeth as needed (for mouth ulcers).  03/02/13   [provider]    Family History Family History  Problem Relation Age of Onset  . Heart attack Father   . Breast  cancer Sister     Social History Social History  Substance Use Topics  . Smoking status: Former Research scientist (life sciences)  . Smokeless tobacco: Never Used     Comment: Quit 10 years ago.  . Alcohol use No     Allergies   Requip [ropinirole hcl]; Topamax [topiramate]; and Zonegran [zonisamide]   Review of Systems Review of Systems  Respiratory: Negative for shortness of breath.   Cardiovascular: Positive for leg swelling. Negative for chest pain.  Musculoskeletal: Positive for myalgias. Negative for arthralgias.  Skin: Negative for color change, rash and wound.     Physical Exam Updated Vital Signs BP (!) 114/97 (BP Location: Left Arm)   Pulse 84   Temp 98.4 F (36.9 C) (Oral)   Resp 16   Ht 5\' 2"  (1.575 m)   Wt 106.1 kg (234 lb)   LMP 05/11/2013   SpO2 97%   BMI 42.80 kg/m   Physical Exam  Constitutional: She appears well-developed and well-nourished.  HENT:  Head: Normocephalic and atraumatic.  Eyes: Conjunctivae are normal.  Neck: Normal range of motion. Neck supple.  Pulmonary/Chest: No respiratory distress.  Musculoskeletal: She exhibits edema.  Patient with swelling of the right lower extremity compared to the left. There is mild tenderness of the calf and thigh, as well as behind the knee. No signs of cellulitis or abscess. Distal sensation intact. 2+ DP pulse. Normal motor.  Neurological: She is alert.  Skin: Skin is warm and dry.   Psychiatric: She has a normal mood and affect.  Nursing note and vitals reviewed.    ED Treatments / Results   Procedures Procedures (including critical care time)  Medications Ordered in ED Medications - No data to display   Initial Impression / Assessment and Plan / ED Course  I have reviewed the triage vital signs and the nursing notes.  Pertinent labs & imaging results that were available during my care of the patient were reviewed by me and considered in my medical decision making (see chart for details).     Patient seen and examined. Reviewed Doppler ultrasound result with patient. Encouraged elevation, compression stockings. Encouraged return to the emergency department with redness, worsening pain, difficulty walking, or other concerns. They verbalized understanding and agreed plan. They will follow-up with PCP for further evaluation.  Vital signs reviewed and are as follows: BP (!) 114/97 (BP Location: Left Arm)   Pulse 84   Temp 98.4 F (36.9 C) (Oral)   Resp 16   Ht 5\' 2"  (1.575 m)   Wt 106.1 kg (234 lb)   LMP 05/11/2013   SpO2 97%   BMI 42.80 kg/m   Vascular Ultrasound RIght lower extremity venous duplex has been completed.  Preliminary findings: No evidence of deep vein thrombosis or baker's cyst in the right lower extremity.  Final Clinical Impressions(s) / ED Diagnoses   Final diagnoses:  Right leg swelling   Patient with right leg swelling and mild tenderness. No signs of cellulitis or other injury. Normal circulation, motor, sensation distally. Conservative measures indicated with PCP follow-up as needed.  New Prescriptions New Prescriptions   No medications on file     Carlisle Cater, Hershal Coria 07/15/17 1604    Sherwood Gambler, MD 07/15/17 1622

## 2017-07-15 NOTE — ED Triage Notes (Signed)
Pt sent by her PCP. Pt complaining of right leg pain, pt's MD concerned for blood clot. Pt states her right leg is more swollen.

## 2017-07-15 NOTE — Discharge Instructions (Signed)
Please read and follow all provided instructions.  Your diagnoses today include:  1. Right leg swelling    Tests performed today include:  Lower extremity venous Doppler ultrasound - does not show any blood clots  Vital signs. See below for your results today.   Medications prescribed:   None  Take any prescribed medications only as directed.  Home care instructions:  Follow any educational materials contained in this packet.  Keep your leg elevated when possible and use compression stockings as needed for swelling.  BE VERY CAREFUL not to take multiple medicines containing Tylenol (also called acetaminophen). Doing so can lead to an overdose which can damage your liver and cause liver failure and possibly death.   Follow-up instructions: Please follow-up with your primary care provider in the next 3 days for further evaluation of your symptoms.   Return instructions:   Please return to the Emergency Department if you experience worsening symptoms.   Return with worsening swelling, pain, redness, fever.  Please return if you have any other emergent concerns.  Additional Information:  Your vital signs today were: BP (!) 114/97 (BP Location: Left Arm)    Pulse 84    Temp 98.4 F (36.9 C) (Oral)    Resp 16    Ht 5\' 2"  (1.575 m)    Wt 106.1 kg (234 lb)    LMP 05/11/2013    SpO2 97%    BMI 42.80 kg/m  If your blood pressure (BP) was elevated above 135/85 this visit, please have this repeated by your doctor within one month. --------------

## 2017-07-15 NOTE — ED Notes (Signed)
Pt states has had rt leg and foot swelling x 1 week states was told by dr LIttle that she needed to come and be checked for DVT, pt has pulse, marked good neuro, foot is warm and dry can wiggle toes

## 2017-07-26 ENCOUNTER — Telehealth: Payer: Self-pay | Admitting: Neurology

## 2017-07-26 ENCOUNTER — Ambulatory Visit: Payer: Self-pay | Admitting: Neurology

## 2017-07-26 NOTE — Telephone Encounter (Signed)
This patient cancelled the same day of the RV appointment.

## 2017-07-27 ENCOUNTER — Encounter: Payer: Self-pay | Admitting: Neurology

## 2017-08-02 ENCOUNTER — Telehealth: Payer: Self-pay | Admitting: Neurology

## 2017-08-02 NOTE — Telephone Encounter (Signed)
Pt is asking that Dr Jannifer Franklin calls her when he has seen all his pt's for the day.  She is asking for a call to discuss the last appointment that she cancelled

## 2017-08-02 NOTE — Telephone Encounter (Signed)
I called patient. The patient be seen within the next 2 days for revisit.

## 2017-08-04 ENCOUNTER — Ambulatory Visit (INDEPENDENT_AMBULATORY_CARE_PROVIDER_SITE_OTHER): Payer: Medicare Other | Admitting: Neurology

## 2017-08-04 ENCOUNTER — Encounter: Payer: Self-pay | Admitting: Neurology

## 2017-08-04 VITALS — BP 140/86 | HR 85 | Ht 62.0 in | Wt 234.0 lb

## 2017-08-04 DIAGNOSIS — Z5181 Encounter for therapeutic drug level monitoring: Secondary | ICD-10-CM

## 2017-08-04 DIAGNOSIS — G40909 Epilepsy, unspecified, not intractable, without status epilepticus: Secondary | ICD-10-CM

## 2017-08-04 DIAGNOSIS — F3162 Bipolar disorder, current episode mixed, moderate: Secondary | ICD-10-CM

## 2017-08-04 DIAGNOSIS — G43019 Migraine without aura, intractable, without status migrainosus: Secondary | ICD-10-CM

## 2017-08-04 MED ORDER — QUETIAPINE FUMARATE 100 MG PO TABS: ORAL_TABLET | ORAL | 11 refills | Status: DC

## 2017-08-04 MED ORDER — ATORVASTATIN CALCIUM 20 MG PO TABS: 20.0000 mg | ORAL_TABLET | Freq: Every day | ORAL | Status: DC

## 2017-08-04 MED ORDER — GABAPENTIN 100 MG PO CAPS: 200.0000 mg | ORAL_CAPSULE | Freq: Three times a day (TID) | ORAL | 5 refills | Status: DC

## 2017-08-04 NOTE — Patient Instructions (Signed)
   Go up on the gabapentin taking 200 mg three times a day.

## 2017-08-04 NOTE — Progress Notes (Signed)
Reason for visit: Seizures, headache  Teresa Lawrence is an 51 y.o. female  History of present illness:  Teresa Lawrence is a 51 year old right-handed white female with a history of bipolar disorder and a history of frequent headaches and a history of seizures. The patient last had a seizure in July 2017. She has been on phenobarbital for a number of years with excellent control. Her levels were somewhat low, suggesting she had missed several doses of her medication. The patient continues to complain of problems with severe mood swings and irritability. She has not been seen through psychiatry. She also complains of headaches that are almost daily in nature, she has been on gabapentin in low dose taking 100 mg 3 times daily. She is tolerating the medication fairly well. She has had some slight swelling in the ankles, she also reports some intermittent problems with feeling dizzy and with a tendency to fall, she last fell within a week prior to this evaluation. The patient does not sleep well at night, she remains sleepy during the day. She returns for an evaluation.  Past Medical History:  Diagnosis Date  . Abnormal Pap smear   . Anxiety   . Bipolar disorder ([place])   . Dysmenorrhea   . Dysplasia   . Dysrhythmia    episodes of palpitations  . Dysuria   . H/O head injury   . Headache(784.0)   . High blood cholesterol   . HSV-2 infection   . Hx of hypercholesterolemia   . Hx of ovarian cyst   . Hx of seizure disorder   . Mass of ovary    left side  . Menorrhagia   . Mental retardation    mild per Dr. notes in [facility]  . Mild mental retardation   . Perimenopausal vasomotor symptoms   . PMS (premenstrual syndrome)   . PMS (premenstrual syndrome)   . PMS (premenstrual syndrome)   . RLS (restless legs syndrome)   . Seizures ([place])   . Uterine mass   . Vaginal yeast infection 2007   recurrent   . Yeast vaginitis     Past Surgical History:  Procedure Laterality Date  . ABDOMINAL  HYSTERECTOMY  2013  . COLPOSCOPY    . CYSTOSCOPY Bilateral 05/30/2013   Procedure: CYSTOSCOPY;  Surgeon: Betsy Coder, MD;  Location: [facility];  Service: Gynecology;  Laterality: Bilateral;  . DILATION AND CURETTAGE OF UTERUS    . ENDOMETRIAL BIOPSY    . HYSTEROSCOPY W/ ENDOMETRIAL ABLATION    . LAPAROSCOPIC HYSTERECTOMY Right 05/30/2013   Procedure: HYSTERECTOMY TOTAL LAPAROSCOPIC;  Surgeon: Betsy Coder, MD;  Location: [facility];  Service: Gynecology;  Laterality: Right;  Right Salpingectomy  . POLYPECTOMY    . WISDOM TOOTH EXTRACTION      Family History  Problem Relation Age of Onset  . Heart attack Father   . Breast cancer Sister     Social history:  reports that she has quit smoking. She has never used smokeless tobacco. She reports that she does not drink alcohol or use drugs.    Allergies  Allergen Reactions  . Requip [Ropinirole Hcl] Nausea And Vomiting  . Topamax [Topiramate]     Nausea  . Zonegran [Zonisamide]     Nausea    Medications:  Prior to Admission medications   Medication Sig Start Date End Date Taking? Authorizing Provider  acyclovir (ZOVIRAX) 200 MG capsule Take 200 mg by mouth 3 (three) times daily as needed (fever blisters).  [provider]  atorvastatin (LIPITOR) 80 MG tablet Take 80 mg by mouth daily. 03/08/14   [provider]  BELSOMRA 20 MG TABS TAKE 1 TABLET BY MOUTH ONCE DAILY AT BEDTIME AS NEEDED 07/08/17   [name], MD  clonazePAM (KLONOPIN) 2 MG tablet TAKE ONE TABLET BY MOUTH TWICE DAILY AND TWO TABLETS AT BEDTIME 06/24/17   [name], MD  DIPHENHYDRAMINE HCL, SLEEP, PO Take 1 tablet by mouth at bedtime as needed (for sleep).    [provider]  escitalopram (LEXAPRO) 10 MG tablet TAKE ONE TABLET BY MOUTH ONCE DAILY 12/28/13   [name], MD  esomeprazole (NEXIUM) 40 MG capsule Take 40 mg by mouth 2 (two) times daily before a meal.    [provider]  furosemide (LASIX) 40 MG tablet  Take 40 mg by mouth daily as needed. 07/24/13   [provider]  gabapentin (NEURONTIN) 100 MG capsule Take 1 capsule (100 mg total) by mouth 3 (three) times daily. 06/03/17   [name], MD  hydrocortisone cream 1 % Apply 1 application topically as needed (itching).    [provider]  ibuprofen (ADVIL,MOTRIN) 600 MG tablet Take 1 tablet (600 mg total) by mouth every 6 (six) hours as needed (mild pain). 05/31/13   Earnstine Regal, PA-C  loperamide (IMODIUM) 2 MG capsule Take 2 mg by mouth 4 (four) times daily as needed for diarrhea or loose stools.    [provider]  Multiple Vitamins-Minerals (MULTIVITAMIN WITH MINERALS) tablet Take 1 tablet by mouth daily.    [provider]  neomycin-bacitracin-polymyxin (NEOSPORIN) [phone] ointment Apply 1 application topically as needed (for cuts).    [provider]  ondansetron (ZOFRAN) 4 MG tablet Take 1 tablet (4 mg total) by mouth every 6 (six) hours as needed for nausea or vomiting. 02/09/17   Julianne Rice, MD  PHENobarbital (LUMINAL) 64.8 MG tablet TAKE 1 TABLET BY MOUTH TWICE DAILY 06/28/17   [name], MD  pramipexole (MIRAPEX) 0.25 MG tablet 1 tablet twice during the day, 2 tablets at night 04/26/17   [name], MD  PRESCRIPTION MEDICATION Place 1 suppository vaginally daily as needed (for yeast infections). Boric Acid Suppository    [provider]  promethazine (PHENERGAN) 25 MG tablet Take 25 mg by mouth every 6 (six) hours as needed for nausea or vomiting.    [provider]  QUEtiapine (SEROQUEL) 100 MG tablet TAKE ONE & ONE-HALF TABLETS BY MOUTH AT BEDTIME 06/29/16   [name], MD  SF 1.1 % GEL dental gel Place 1.1 mLs onto teeth. 08/31/13   [provider]  sucralfate (CARAFATE) 1 GM/10ML suspension Take 10 mLs (1 g total) by mouth 4 (four) times daily -  with meals and at bedtime. 02/09/17   Julianne Rice, MD  traMADol (ULTRAM) 50 MG tablet  Take 1 tablet (50 mg total) by mouth every 6 (six) hours as needed for severe pain. 02/09/17   Julianne Rice, MD  triamcinolone (KENALOG) 0.1 % paste Place 1 application onto teeth as needed (for mouth ulcers).  03/02/13   [provider]    ROS:  Out of a complete 14 system review of symptoms, the patient complains only of the following symptoms, and all other reviewed systems are negative.  Chills, fatigue Leg swelling Heat intolerance, excessive thirst Nausea Restless legs, insomnia Frequency of urination Muscle cramps Memory loss, dizziness, headache Behavior problem, confusion, depression, anxiety, suicidal thoughts, hyperactivity, self injury  Blood pressure 140/86, pulse 85, height 5\' 2"  (1.575 m), weight 234 lb (106.1 kg), last menstrual period 05/11/2013.  Physical Exam  General: The patient is alert and cooperative at the time of the examination. The patient is markedly obese.  Skin: No significant peripheral edema is noted.   Neurologic Exam  Mental status: The patient is alert and oriented x 3 at the time of the examination. The patient has apparent normal recent and remote memory, with an apparently normal attention span and concentration ability.   Cranial nerves: Facial symmetry is present. Speech is normal, no aphasia or dysarthria is noted. Extraocular movements are full. Visual fields are full.  Motor: The patient has good strength in all 4 extremities.  Sensory examination: Soft touch sensation is symmetric on the face, arms, and legs.  Coordination: The patient has good finger-nose-finger and heel-to-shin bilaterally.  Gait and station: The patient has a normal gait. Tandem gait is slightly unsteady. Romberg is negative. No drift is seen.  Reflexes: Deep tendon reflexes are symmetric.   Assessment/Plan:  1. History seizures  2. Frequent headache  3. Bipolar disorder  The patient will be referred to psychiatry for management of her  bipolar disorder. The patient will be increased on the gabapentin taking 200 mg 3 times daily. She will have blood work done today, she is to remain on her phenobarbital. A prescription was given for the gabapentin and for her Seroquel. She will follow-up in 6 months.  Jill Alexanders MD 08/04/2017 2:01 PM  [facility] [address] [place], [place] [address]  Phone [phone] Fax [phone]

## 2017-08-05 ENCOUNTER — Telehealth: Payer: Self-pay | Admitting: *Deleted

## 2017-08-05 ENCOUNTER — Encounter: Payer: Self-pay | Admitting: *Deleted

## 2017-08-05 LAB — CBC WITH DIFFERENTIAL/PLATELET
Basophils Absolute: 0 10*3/uL (ref 0.0–0.2)
Basos: 0 %
EOS (ABSOLUTE): 0.2 10*3/uL (ref 0.0–0.4)
EOS: 2 %
Hematocrit: 38.3 % (ref 34.0–46.6)
Hemoglobin: 12.5 g/dL (ref 11.1–15.9)
IMMATURE GRANS (ABS): 0 10*3/uL (ref 0.0–0.1)
Immature Granulocytes: 0 %
LYMPHS ABS: 2.4 10*3/uL (ref 0.7–3.1)
Lymphs: 25 %
MCH: 29.2 pg (ref 26.6–33.0)
MCHC: 32.6 g/dL (ref 31.5–35.7)
MCV: 90 fL (ref 79–97)
Monocytes Absolute: 0.7 10*3/uL (ref 0.1–0.9)
Monocytes: 8 %
Neutrophils Absolute: 6.1 10*3/uL (ref 1.4–7.0)
Neutrophils: 65 %
PLATELETS: 329 10*3/uL (ref 150–379)
RBC: 4.28 x10E6/uL (ref 3.77–5.28)
RDW: 13.8 % (ref 12.3–15.4)
WBC: 9.5 10*3/uL (ref 3.4–10.8)

## 2017-08-05 LAB — COMPREHENSIVE METABOLIC PANEL
ALBUMIN: 4.4 g/dL (ref 3.5–5.5)
ALK PHOS: 139 IU/L — AB (ref 39–117)
ALT: 18 IU/L (ref 0–32)
AST: 15 IU/L (ref 0–40)
Albumin/Globulin Ratio: 1.2 (ref 1.2–2.2)
BUN / CREAT RATIO: 14 (ref 9–23)
BUN: 9 mg/dL (ref 6–24)
Bilirubin Total: 0.2 mg/dL (ref 0.0–1.2)
CO2: 25 mmol/L (ref 20–29)
CREATININE: 0.65 mg/dL (ref 0.57–1.00)
Calcium: 9.7 mg/dL (ref 8.7–10.2)
Chloride: 102 mmol/L (ref 96–106)
GFR calc Af Amer: 120 mL/min/{1.73_m2} (ref >59)
GFR calc non Af Amer: 104 mL/min/{1.73_m2} (ref >59)
Globulin, Total: 3.6 g/dL (ref 1.5–4.5)
Glucose: 82 mg/dL (ref 65–99)
Potassium: 4.3 mmol/L (ref 3.5–5.2)
Sodium: 145 mmol/L — ABNORMAL HIGH (ref 134–144)
TOTAL PROTEIN: 8 g/dL (ref 6.0–8.5)

## 2017-08-05 LAB — PHENOBARBITAL LEVEL: PHENOBARBITAL, SERUM: 14 ug/mL — AB (ref 15–40)

## 2017-08-05 NOTE — Telephone Encounter (Signed)
-----   Message from [name], MD sent at 08/05/2017  8:31 AM EDT -----  Blood work is unremarkable exception of a slightly elevated sodium level, not likely clinically significant. The alkaline phosphatase level is slightly elevated, will follow-up her time. The phenobarbital level was slightly low, improved from last blood check. No change in dosing. Please call the patient. ----- Message ----- From: Lavone Neri Lab Results In Sent: 08/05/2017   7:43 AM To: [name], MD

## 2017-08-05 NOTE — Telephone Encounter (Signed)
Pt is asking for a call back with results on [phone]

## 2017-08-05 NOTE — Telephone Encounter (Signed)
Called and spoke with patient about results per CW,MD note. She verbalized understanding. She requested letter be mailed regarding results.  I placed letter in mail for pt today.

## 2017-08-05 NOTE — Telephone Encounter (Signed)
Called and LVM for patient to call about results. Gave [facility] phone number.

## 2017-08-11 NOTE — Telephone Encounter (Signed)
Pt called she rec'd lab results. She would like to discuss

## 2017-08-11 NOTE — Telephone Encounter (Signed)
I called patient. I went over the blood work with her, alkaline phosphatase was slightly elevated, likely related to use of phenobarbital.  Sodium level was minimally elevated, otherwise blood work was unremarkable.

## 2017-08-30 ENCOUNTER — Telehealth: Payer: Self-pay | Admitting: Neurology

## 2017-08-30 NOTE — Telephone Encounter (Signed)
Hinton Dyer- can you check on this? Looks like referral was sent on 08/04/17

## 2017-08-30 NOTE — Telephone Encounter (Signed)
Pt said Dr Jannifer Franklin was to refer her to Dr Kaur/psychiatrist office. Pt has already called and scheduled appt for 1/22/@ 1:00. She called back today to update her phone number and was advised they did not have a record of the appt. She said she was then advised to keep that appt or she would have to pay $50. Pt said she was confused, did not understand that. She said they advised her they need a referral.

## 2017-08-31 NOTE — Telephone Encounter (Signed)
Noted, thank you

## 2017-08-31 NOTE — Telephone Encounter (Signed)
Spoke with Hinton Dyer, there is referral that was placed on 08/04/17. Printed referral and gave to [facility]. She is going to look more into this

## 2017-08-31 NOTE — Telephone Encounter (Signed)
Teresa Lawrence this referral is expired last referral was sent 2017.

## 2017-08-31 NOTE — Telephone Encounter (Signed)
Patient is not scheduled with Dr. Toy Care due to her insurance. Patient is scheduled with Dr.Alexander [facility]  psychiatrist Nov 09, 2017 arrive at 12:30 for 1:00 apt. Telephone [phone] [place]  I have spoke to patient and she is aware. Thanks Hinton Dyer.

## 2017-10-12 ENCOUNTER — Emergency Department (HOSPITAL_COMMUNITY): Payer: Medicare Other

## 2017-10-12 ENCOUNTER — Emergency Department (HOSPITAL_COMMUNITY)
Admission: EM | Admit: 2017-10-12 | Discharge: 2017-10-12 | Disposition: A | Discharge status: Home / Self Care | Payer: Medicare Other | Attending: Emergency Medicine | Admitting: Emergency Medicine

## 2017-10-12 ENCOUNTER — Encounter (HOSPITAL_COMMUNITY): Payer: Self-pay | Admitting: Nurse Practitioner

## 2017-10-12 DIAGNOSIS — Y939 Activity, unspecified: Secondary | ICD-10-CM | POA: Diagnosis not present

## 2017-10-12 DIAGNOSIS — Z87891 Personal history of nicotine dependence: Secondary | ICD-10-CM | POA: Insufficient documentation

## 2017-10-12 DIAGNOSIS — W108XXA Fall (on) (from) other stairs and steps, initial encounter: Secondary | ICD-10-CM | POA: Insufficient documentation

## 2017-10-12 DIAGNOSIS — W19XXXA Unspecified fall, initial encounter: Secondary | ICD-10-CM

## 2017-10-12 DIAGNOSIS — Z79899 Other long term (current) drug therapy: Secondary | ICD-10-CM | POA: Diagnosis not present

## 2017-10-12 DIAGNOSIS — S82892A Other fracture of left lower leg, initial encounter for closed fracture: Secondary | ICD-10-CM

## 2017-10-12 DIAGNOSIS — Y929 Unspecified place or not applicable: Secondary | ICD-10-CM | POA: Insufficient documentation

## 2017-10-12 DIAGNOSIS — R569 Unspecified convulsions: Secondary | ICD-10-CM | POA: Diagnosis not present

## 2017-10-12 DIAGNOSIS — Y999 Unspecified external cause status: Secondary | ICD-10-CM | POA: Insufficient documentation

## 2017-10-12 DIAGNOSIS — S01511A Laceration without foreign body of lip, initial encounter: Secondary | ICD-10-CM | POA: Insufficient documentation

## 2017-10-12 LAB — CBC WITH DIFFERENTIAL/PLATELET
Basophils Absolute: 0 10*3/uL (ref 0.0–0.1)
Basophils Relative: 0 %
EOS ABS: 0.2 10*3/uL (ref 0.0–0.7)
EOS PCT: 2 %
HCT: 38.7 % (ref 36.0–46.0)
Hemoglobin: 12 g/dL (ref 12.0–15.0)
LYMPHS ABS: 2.1 10*3/uL (ref 0.7–4.0)
Lymphocytes Relative: 24 %
MCH: 28.9 pg (ref 26.0–34.0)
MCHC: 31 g/dL (ref 30.0–36.0)
MCV: 93.3 fL (ref 78.0–100.0)
Monocytes Absolute: 0.7 10*3/uL (ref 0.1–1.0)
Monocytes Relative: 7 %
Neutro Abs: 5.9 10*3/uL (ref 1.7–7.7)
Neutrophils Relative %: 67 %
PLATELETS: 328 10*3/uL (ref 150–400)
RBC: 4.15 MIL/uL (ref 3.87–5.11)
RDW: 13.5 % (ref 11.5–15.5)
WBC: 8.9 10*3/uL (ref 4.0–10.5)

## 2017-10-12 LAB — COMPREHENSIVE METABOLIC PANEL
ALT: 33 U/L (ref 14–54)
ANION GAP: 8 (ref 5–15)
AST: 27 U/L (ref 15–41)
Albumin: 3.5 g/dL (ref 3.5–5.0)
Alkaline Phosphatase: 101 U/L (ref 38–126)
BUN: 13 mg/dL (ref 6–20)
CHLORIDE: 101 mmol/L (ref 101–111)
CO2: 31 mmol/L (ref 22–32)
Calcium: 9.1 mg/dL (ref 8.9–10.3)
Creatinine, Ser: 0.62 mg/dL (ref 0.44–1.00)
GFR calc non Af Amer: >60 mL/min (ref >60)
Glucose, Bld: 112 mg/dL — ABNORMAL HIGH (ref 65–99)
Potassium: 3.6 mmol/L (ref 3.5–5.1)
SODIUM: 140 mmol/L (ref 135–145)
Total Bilirubin: 0.6 mg/dL (ref 0.3–1.2)
Total Protein: 7.2 g/dL (ref 6.5–8.1)

## 2017-10-12 LAB — CBG MONITORING, ED: GLUCOSE-CAPILLARY: 96 mg/dL (ref 65–99)

## 2017-10-12 LAB — ETHANOL

## 2017-10-12 LAB — I-STAT TROPONIN, ED: Troponin i, poc: 0.01 ng/mL (ref 0.00–0.08)

## 2017-10-12 LAB — I-STAT BETA HCG BLOOD, ED (MC, WL, AP ONLY)

## 2017-10-12 LAB — PHENOBARBITAL LEVEL: Phenobarbital: 10.1 ug/mL — ABNORMAL LOW (ref 15.0–30.0)

## 2017-10-12 MED ORDER — HYDROCODONE-ACETAMINOPHEN 5-325 MG PO TABS
2.0000 | ORAL_TABLET | Freq: Once | ORAL | Status: AC
  Administered 2017-10-12: 2 via ORAL
  Filled 2017-10-12: qty 2

## 2017-10-12 MED ORDER — SODIUM CHLORIDE 0.9 % IV BOLUS (SEPSIS)
1000.0000 mL | Freq: Once | INTRAVENOUS | Status: AC
  Administered 2017-10-12: 1000 mL via INTRAVENOUS

## 2017-10-12 MED ORDER — HYDROCODONE-ACETAMINOPHEN 5-325 MG PO TABS: 1.0000 | ORAL_TABLET | Freq: Four times a day (QID) | ORAL | 0 refills | Status: DC | PRN

## 2017-10-12 MED ORDER — LIDOCAINE HCL 2 % IJ SOLN
10.0000 mL | Freq: Once | INTRAMUSCULAR | Status: AC
  Administered 2017-10-12: 200 mg
  Filled 2017-10-12: qty 20

## 2017-10-12 MED ORDER — MORPHINE SULFATE (PF) 4 MG/ML IV SOLN
4.0000 mg | Freq: Once | INTRAVENOUS | Status: AC
  Administered 2017-10-12: 4 mg via INTRAVENOUS
  Filled 2017-10-12: qty 1

## 2017-10-12 MED ORDER — ONDANSETRON HCL 4 MG/2ML IJ SOLN
4.0000 mg | Freq: Once | INTRAMUSCULAR | Status: AC
Start: 2017-10-12 — End: 2017-10-12
  Administered 2017-10-12: 4 mg via INTRAVENOUS
  Filled 2017-10-12: qty 2

## 2017-10-12 NOTE — Discharge Instructions (Signed)
Continue your gabapentin for pain.   Take vicodin for severe pain.   Use cam walker. Follow up with ortho for repeat xrays in a week.   See your neurologist.,   Your sutures will dissolve in a week   Return to ER if you have another seizure, headaches, vomiting, severe pain

## 2017-10-12 NOTE — ED Notes (Signed)
Ortho tech paged  

## 2017-10-12 NOTE — Progress Notes (Signed)
Orthopedic Tech Progress Note Patient Details:  [name] 1966/08/01 [id number]  Ortho Devices Type of Ortho Device: CAM walker Ortho Device/Splint Location: LLE Ortho Device/Splint Interventions: Ordered, Application   Post Interventions Patient Tolerated: Well Instructions Provided: Care of device   [name] 10/12/2017, 8:08 PM

## 2017-10-12 NOTE — ED Notes (Addendum)
Ortho aged

## 2017-10-12 NOTE — ED Provider Notes (Signed)
[facility] DEPARTMENT Provider Note   CSN: [id number] Arrival date & time: 10/12/17  1527     History   Chief Complaint Chief Complaint  Patient presents with  . Seizures    HPI Teresa Lawrence is a 51 y.o. female hx of bipolar, seizure on phenobarb, here with possible seizure. Patient states that she tripped and fell and landed on the bottom of the steps. She then went to her neighbor who noticed that she had bleeding from her lip. EMS was called and EMS noticed seizure like activity but patient was awake at that time. Patient states that she takes phenobarb for seizures and has been compliant with it. She has some lower abdomina pain but denies any vomiting. Tdap up to date.   The history is provided by the patient.    Past Medical History:  Diagnosis Date  . Abnormal Pap smear   . Anxiety   . Bipolar disorder ([place])   . Dysmenorrhea   . Dysplasia   . Dysrhythmia    episodes of palpitations  . Dysuria   . H/O head injury   . Headache(784.0)   . High blood cholesterol   . HSV-2 infection   . Hx of hypercholesterolemia   . Hx of ovarian cyst   . Hx of seizure disorder   . Mass of ovary    left side  . Menorrhagia   . Mental retardation    mild per Dr. notes in [facility]  . Mild mental retardation   . Perimenopausal vasomotor symptoms   . PMS (premenstrual syndrome)   . PMS (premenstrual syndrome)   . PMS (premenstrual syndrome)   . RLS (restless legs syndrome)   . Seizures ([place])   . Uterine mass   . Vaginal yeast infection 2007   recurrent   . Yeast vaginitis     Patient Active Problem List   Diagnosis Date Noted  . Common migraine with intractable migraine 05/14/2016  . Insomnia 03/17/2013  . Headache(784.0) 02/23/2013  . Hx of yeast infection 04/20/2012  . Hx of menorrhagia 04/20/2012  . 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 Bipolar disorder ([place]) 04/20/2012  . Hx of menorrhagia 04/20/2012  . Hx of ovarian  cyst 04/20/2012  . Epilepsy ([place]) 04/20/2012  . Hx of seizure disorder 04/20/2012  . 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 Hx of dysmenorrhea 04/20/2012  . 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 Hx of hypercholesterolemia 04/20/2012  . Hx of PMS (premenstrual syndrome) 04/20/2012  . Encounter for therapeutic drug monitoring 05/01/2011    Past Surgical History:  Procedure Laterality Date  . ABDOMINAL HYSTERECTOMY  2013  . COLPOSCOPY    . CYSTOSCOPY Bilateral 05/30/2013   Procedure: CYSTOSCOPY;  Surgeon: Betsy Coder, MD;  Location: [facility];  Service: Gynecology;  Laterality: Bilateral;  . DILATION AND CURETTAGE OF UTERUS    . ENDOMETRIAL BIOPSY    . HYSTEROSCOPY W/ ENDOMETRIAL ABLATION    . LAPAROSCOPIC HYSTERECTOMY Right 05/30/2013   Procedure: HYSTERECTOMY TOTAL LAPAROSCOPIC;  Surgeon: Betsy Coder, MD;  Location: [facility];  Service: Gynecology;  Laterality: Right;  Right Salpingectomy  . POLYPECTOMY    . WISDOM TOOTH EXTRACTION      OB History    No data available       Home Medications    Prior to Admission medications   Medication Sig Start Date End Date Taking? Authorizing Provider  acyclovir (ZOVIRAX) 200 MG capsule Take 200 mg by mouth  3 (three) times daily as needed (fever blisters).    Yes [provider]  atorvastatin (LIPITOR) 20 MG tablet Take 1 tablet (20 mg total) by mouth daily. 08/04/17  Yes [name], MD  BELSOMRA 20 MG TABS TAKE 1 TABLET BY MOUTH ONCE DAILY AT BEDTIME AS NEEDED 07/08/17  Yes [name], MD  clonazePAM (KLONOPIN) 2 MG tablet TAKE ONE TABLET BY MOUTH TWICE DAILY AND TWO TABLETS AT BEDTIME Patient taking differently: TAKE 2 mgTABLET BY MOUTH TWICE DAILY AND 4 mg  TABLETS AT BEDTIME 06/24/17  Yes [name], MD  DIPHENHYDRAMINE HCL, SLEEP, PO Take 1 tablet by mouth at bedtime as needed (for sleep).   Yes [provider]  furosemide (LASIX) 40 MG tablet Take 40 mg by mouth daily as needed. 07/24/13  Yes [provider]  gabapentin (NEURONTIN) 100 MG capsule Take 2 capsules (200 mg total) by mouth 3 (three) times daily. Patient taking differently: Take 200 mg by mouth as needed.  08/04/17  Yes [name], MD  hydrocortisone cream 1 % Apply 1 application topically as needed (itching).   Yes [provider]  ibuprofen (ADVIL,MOTRIN) 600 MG tablet Take 1 tablet (600 mg total) by mouth every 6 (six) hours as needed (mild pain). 05/31/13  Yes Earnstine Regal, PA-C  loperamide (IMODIUM) 2 MG capsule Take 2 mg by mouth 4 (four) times daily as needed for diarrhea or loose stools.   Yes [provider]  pantoprazole (PROTONIX) 40 MG tablet Take 40 mg by mouth 2 (two) times daily.   Yes [provider]  PHENobarbital (LUMINAL) 64.8 MG tablet TAKE 1 TABLET BY MOUTH TWICE DAILY 06/28/17  Yes [name], MD  pramipexole (MIRAPEX) 0.25 MG tablet 1 tablet twice during the day, 2 tablets at night Patient taking differently: Take 0.25 mg by mouth See admin instructions. 0.25 mg tablet twice during the day 0.50 mg tablets at night 04/26/17  Yes [name], MD  promethazine (PHENERGAN) 25 MG tablet Take 25 mg by mouth every 6 (six) hours as needed for nausea or vomiting.   Yes [provider]  QUEtiapine (SEROQUEL) 100 MG tablet TAKE ONE & ONE-HALF TABLETS BY MOUTH AT BEDTIME Patient taking differently: Take 150 mg by mouth at bedtime. TAKE ONE & ONE-HALF TABLETS BY MOUTH AT BEDTIME 08/04/17  Yes [name], MD    Family History Family History  Problem Relation Age of Onset  . Heart attack Father   . Breast cancer Sister     Social History Social History   Tobacco Use  . Smoking status: Former Research scientist (life sciences)  . Smokeless tobacco: Never Used  . Tobacco comment: Quit 10 years ago.  Substance Use Topics  . Alcohol use: No    Alcohol/week: 0.0 oz  . Drug use: No     Allergies   Requip [ropinirole hcl]; Topamax [topiramate]; and Zonegran  [zonisamide]   Review of Systems Review of Systems  HENT:       Lip laceration   Musculoskeletal:       R knee pain  All other systems reviewed and are negative.    Physical Exam Updated Vital Signs BP 111/78   Pulse 81   Temp 98 F (36.7 C) (Axillary) Comment: Pt has injury to mouth  Resp 17   Ht 5\' 4"  (1.626 m)   Wt 106.1 kg (234 lb)   LMP 05/11/2013   SpO2 95%   BMI 40.17 kg/m  Physical Exam  Constitutional: She appears well-developed.  HENT:  Head: Normocephalic.  Mouth/Throat: Oropharynx is clear and moist.  2 cm upper lip laceration. No missing teeth. No jaw tenderness   Eyes: Conjunctivae and EOM are normal. Pupils are equal, round, and reactive to light.  Neck: Normal range of motion. Neck supple.  Cardiovascular: Normal rate, regular rhythm and normal heart sounds.  Pulmonary/Chest: Effort normal and breath sounds normal. No stridor. No respiratory distress. She has no wheezes.  Abdominal: Soft. Bowel sounds are normal. She exhibits no distension. There is no tenderness. There is no guarding.  No obvious abdominal bruising or ecchymosis  Musculoskeletal: Normal range of motion.  Abrasion R knee, no obvious deformity, no spinal tenderness, + L ankle swelling   Neurological: She is alert.  Skin: Skin is warm.  Psychiatric: She has a normal mood and affect.  Nursing note and vitals reviewed.    ED Treatments / Results  Labs (all labs ordered are listed, but only abnormal results are displayed) Labs Reviewed  PHENOBARBITAL LEVEL - Abnormal; Notable for the following components:      Result Value   Phenobarbital 10.1 (*)    All other components within normal limits  COMPREHENSIVE METABOLIC PANEL - Abnormal; Notable for the following components:   Glucose, Bld 112 (*)    All other components within normal limits  CBC WITH DIFFERENTIAL/PLATELET  ETHANOL  CBG MONITORING, ED  I-STAT TROPONIN, ED  I-STAT BETA HCG BLOOD, ED ([facility], [facility], [facility] ONLY)    EKG   EKG Interpretation  Date/Time:  Tuesday October 12 2017 15:39:52 EST Ventricular Rate:  87 PR Interval:    QRS Duration: 81 QT Interval:  374 QTC Calculation: 450 R Axis:   81 Text Interpretation:  Sinus rhythm No previous ECGs available Confirmed by Wandra Arthurs ([id number]) on 10/12/2017 4:28:19 PM       Radiology Dg Chest 2 View  Result Date: 10/12/2017 CLINICAL DATA:  Fall. EXAM: CHEST  2 VIEW COMPARISON:  None. FINDINGS: The heart size and mediastinal contours are within normal limits. There are some blebs at the left apex. There is no evidence of pulmonary edema, consolidation, pneumothorax, nodule or pleural fluid. The visualized skeletal structures are unremarkable. IMPRESSION: No active cardiopulmonary disease. Electronically Signed   By: Aletta Edouard M.D.   On: 10/12/2017 17:08   Dg Ankle Complete Left  Result Date: 10/12/2017 CLINICAL DATA:  Lateral ankle pain EXAM: LEFT ANKLE COMPLETE - 3+ VIEW COMPARISON:  None. FINDINGS: Transverse nondisplaced fracture of the lateral malleolus. Severe overlying soft tissue swelling. No other acute fracture or dislocation. Small plantar calcaneal spur. IMPRESSION: 1. Transverse nondisplaced fracture of the lateral malleolus. Severe overlying soft tissue swelling. Electronically Signed   By: Kathreen Devoid   On: 10/12/2017 18:25   Ct Head Wo Contrast  Result Date: 10/12/2017 CLINICAL DATA:  Unwitnessed fall, positive loss of consciousness. EXAM: CT HEAD WITHOUT CONTRAST CT MAXILLOFACIAL WITHOUT CONTRAST CT CERVICAL SPINE WITHOUT CONTRAST TECHNIQUE: Multidetector CT imaging of the head, cervical spine, and maxillofacial structures were performed using the standard protocol without intravenous contrast. Multiplanar CT image reconstructions of the cervical spine and maxillofacial structures were also generated. COMPARISON:  None. FINDINGS: CT HEAD FINDINGS Brain: No evidence of acute infarction, hemorrhage, hydrocephalus, extra-axial collection or  mass lesion/mass effect. Vascular: No hyperdense vessel or unexpected calcification. Skull: Normal. Negative for fracture or focal lesion. Other: None. CT MAXILLOFACIAL FINDINGS Osseous: No fracture or mandibular dislocation. No destructive process. Orbits: Negative. No traumatic  or inflammatory finding. Sinuses: Clear. Soft tissues: Negative. CT CERVICAL SPINE FINDINGS Alignment: Normal. Skull base and vertebrae: No acute fracture. No primary bone lesion or focal pathologic process. Soft tissues and spinal canal: No prevertebral fluid or swelling. No visible canal hematoma. Disc levels: Moderate degenerative disc disease is noted at C4-5, C5-6 and C6-7. Upper chest: Negative. Other: None. IMPRESSION: Normal head CT. No abnormality seen in maxillofacial region. Multilevel degenerative disc disease. No acute abnormality seen in the cervical spine. Electronically Signed   By: Marijo Conception, M.D.   On: 10/12/2017 17:43   Ct Cervical Spine Wo Contrast  Result Date: 10/12/2017 CLINICAL DATA:  Unwitnessed fall, positive loss of consciousness. EXAM: CT HEAD WITHOUT CONTRAST CT MAXILLOFACIAL WITHOUT CONTRAST CT CERVICAL SPINE WITHOUT CONTRAST TECHNIQUE: Multidetector CT imaging of the head, cervical spine, and maxillofacial structures were performed using the standard protocol without intravenous contrast. Multiplanar CT image reconstructions of the cervical spine and maxillofacial structures were also generated. COMPARISON:  None. FINDINGS: CT HEAD FINDINGS Brain: No evidence of acute infarction, hemorrhage, hydrocephalus, extra-axial collection or mass lesion/mass effect. Vascular: No hyperdense vessel or unexpected calcification. Skull: Normal. Negative for fracture or focal lesion. Other: None. CT MAXILLOFACIAL FINDINGS Osseous: No fracture or mandibular dislocation. No destructive process. Orbits: Negative. No traumatic or inflammatory finding. Sinuses: Clear. Soft tissues: Negative. CT CERVICAL SPINE FINDINGS  Alignment: Normal. Skull base and vertebrae: No acute fracture. No primary bone lesion or focal pathologic process. Soft tissues and spinal canal: No prevertebral fluid or swelling. No visible canal hematoma. Disc levels: Moderate degenerative disc disease is noted at C4-5, C5-6 and C6-7. Upper chest: Negative. Other: None. IMPRESSION: Normal head CT. No abnormality seen in maxillofacial region. Multilevel degenerative disc disease. No acute abnormality seen in the cervical spine. Electronically Signed   By: Marijo Conception, M.D.   On: 10/12/2017 17:43   Dg Knee Complete 4 Views Right  Result Date: 10/12/2017 CLINICAL DATA:  Fall with right knee laceration and injury. Initial encounter. EXAM: RIGHT KNEE - COMPLETE 4+ VIEW COMPARISON:  None. FINDINGS: No evidence of fracture, dislocation, or joint effusion. Mild patellofemoral degenerative disease. Suggestion of minimal medial joint space narrowing. No bony lesions. Soft tissues are unremarkable. IMPRESSION: No acute findings. Mild patellofemoral degenerative disease and minimal medial joint space narrowing. Electronically Signed   By: Aletta Edouard M.D.   On: 10/12/2017 17:07   Ct Maxillofacial Wo Contrast  Result Date: 10/12/2017 CLINICAL DATA:  Unwitnessed fall, positive loss of consciousness. EXAM: CT HEAD WITHOUT CONTRAST CT MAXILLOFACIAL WITHOUT CONTRAST CT CERVICAL SPINE WITHOUT CONTRAST TECHNIQUE: Multidetector CT imaging of the head, cervical spine, and maxillofacial structures were performed using the standard protocol without intravenous contrast. Multiplanar CT image reconstructions of the cervical spine and maxillofacial structures were also generated. COMPARISON:  None. FINDINGS: CT HEAD FINDINGS Brain: No evidence of acute infarction, hemorrhage, hydrocephalus, extra-axial collection or mass lesion/mass effect. Vascular: No hyperdense vessel or unexpected calcification. Skull: Normal. Negative for fracture or focal lesion. Other: None. CT  MAXILLOFACIAL FINDINGS Osseous: No fracture or mandibular dislocation. No destructive process. Orbits: Negative. No traumatic or inflammatory finding. Sinuses: Clear. Soft tissues: Negative. CT CERVICAL SPINE FINDINGS Alignment: Normal. Skull base and vertebrae: No acute fracture. No primary bone lesion or focal pathologic process. Soft tissues and spinal canal: No prevertebral fluid or swelling. No visible canal hematoma. Disc levels: Moderate degenerative disc disease is noted at C4-5, C5-6 and C6-7. Upper chest: Negative. Other: None. IMPRESSION: Normal head CT. No abnormality seen  in maxillofacial region. Multilevel degenerative disc disease. No acute abnormality seen in the cervical spine. Electronically Signed   By: Marijo Conception, M.D.   On: 10/12/2017 17:43    Procedures Procedures (including critical care time)  LACERATION REPAIR Performed by: Wandra Arthurs Authorized by: Wandra Arthurs Consent: Verbal consent obtained. Risks and benefits: risks, benefits and alternatives were discussed Consent given by: patient Patient identity confirmed: provided demographic data Prepped and Draped in normal sterile fashion Wound explored  Laceration Location: upper lip  Laceration Length: 2 cm  No Foreign Bodies seen or palpated  Anesthesia: local infiltration  Local anesthetic: lidocaine 2% no epinephrine  Anesthetic total: 10 ml  Irrigation method: syringe Amount of cleaning: standard  Skin closure: simple interrupted   Number of sutures: 6 5-0 vicryl rapide   Technique: simple interrupted   Patient tolerance: Patient tolerated the procedure well with no immediate complications.   Medications Ordered in ED Medications  sodium chloride 0.9 % bolus 1,000 mL (0 mLs Intravenous Stopped 10/12/17 1855)  morphine 4 MG/ML injection 4 mg (4 mg Intravenous Given 10/12/17 1724)  lidocaine (XYLOCAINE) 2 % (with pres) injection 200 mg (200 mg Infiltration Given 10/12/17 1736)  ondansetron  (ZOFRAN) injection 4 mg (4 mg Intravenous Given 10/12/17 1818)  HYDROcodone-acetaminophen (NORCO/VICODIN) 5-325 MG per tablet 2 tablet (2 tablets Oral Given 10/12/17 1945)     Initial Impression / Assessment and Plan / ED Course  I have reviewed the triage vital signs and the nursing notes.  Pertinent labs & imaging results that were available during my care of the patient were reviewed by me and considered in my medical decision making (see chart for details).     Teresa Lawrence is a 51 y.o. female here with fall, possible seizure. Patient is on phenobarb and has been compliant with it. Will get CT head/neck/face, xrays, labs, phenobarb level.   6 pm Phenobarb level 10. I called Dr. Rory Percy, who reviewed her case. He states that her level is always slightly subtherapeutic and since she is on gabapentin, will not need to adjust phenobarb right now. She denies having seizures today.   8:02 PM Lip laceration sutured. CT head/neck/face unremarkable. Xray showed L lateral malleolus fracture. She had ankle fracture before so I am not sure if this is new. She doesn't want crutches and splint. She prefers Cam walker. Will refer to ortho for repeat xrays. Will dc home with vicodin for pain.    Final Clinical Impressions(s) / ED Diagnoses   Final diagnoses:  Fall    ED Discharge Orders    None       Drenda Freeze, MD 10/12/17 2005

## 2017-10-12 NOTE — ED Triage Notes (Signed)
Per EMS called out for unwitnessed fall with LOC patient became alert after incident and notified bystander upon fire arrival patient was noted to have seizure-like activity with upper body. Pt has hx of seizures and takes phenobarbital. Lac to upper lip, teeth intact, swelling to left ankle. Pt denies neck pain, no stepoff or tenderness- C-collar put on for precautions. Left lower quadrant abdominal pain- mild nausea.

## 2017-10-25 ENCOUNTER — Encounter (INDEPENDENT_AMBULATORY_CARE_PROVIDER_SITE_OTHER): Payer: Self-pay | Admitting: Orthopaedic Surgery

## 2017-10-25 ENCOUNTER — Ambulatory Visit (INDEPENDENT_AMBULATORY_CARE_PROVIDER_SITE_OTHER): Payer: Medicare Other | Admitting: Orthopaedic Surgery

## 2017-10-25 ENCOUNTER — Ambulatory Visit (INDEPENDENT_AMBULATORY_CARE_PROVIDER_SITE_OTHER): Payer: Medicare Other

## 2017-10-25 DIAGNOSIS — M25572 Pain in left ankle and joints of left foot: Secondary | ICD-10-CM | POA: Diagnosis not present

## 2017-10-25 DIAGNOSIS — S8265XA Nondisplaced fracture of lateral malleolus of left fibula, initial encounter for closed fracture: Secondary | ICD-10-CM | POA: Diagnosis not present

## 2017-10-25 MED ORDER — HYDROCODONE-ACETAMINOPHEN 5-325 MG PO TABS: 1.0000 | ORAL_TABLET | Freq: Every day | ORAL | 0 refills | Status: DC | PRN

## 2017-10-25 NOTE — Progress Notes (Signed)
Office Visit Note   Patient: Teresa Lawrence           Date of Birth: 04-20-1966           MRN: [id number] Visit Date: 10/25/2017              Requested by: Tamsen Roers, [place], [place] [address] PCP: Tamsen Roers, MD   Assessment & Plan: Visit Diagnoses:  1. Nondisplaced fracture of lateral malleolus of left fibula, initial encounter for closed fracture     Plan: Impression is 52 year old female nondisplaced Weber a lateral malleolus fracture.  We will plan on treating this nonoperatively.  Continue Cam walker and weight-bear as tolerated.  Prescription for hydrocodone.  Plan on beginning physical therapy at that time.  Follow-Up Instructions: Return in about 4 weeks (around 11/22/2017).   Orders:  Orders Placed This Encounter  Procedures  . XR Ankle Complete Left   Meds ordered this encounter  Medications  . HYDROcodone-acetaminophen (NORCO) 5-325 MG tablet    Sig: Take 1-2 tablets by mouth daily as needed.    Dispense:  30 tablet    Refill:  0      Procedures: No procedures performed   Clinical Data: No additional findings.   Subjective: Chief Complaint  Patient presents with  . Left Ankle - Pain    Patient is a 52 year old female who had a mechanical fall on Christmas day and sustained a nondisplaced distal fibula fracture.  She follows up today for further evaluation and treatment.  She is ambulating with a Cam walker and crutches.  Denies any numbness and tingling.  She does have some swelling.    Review of Systems  Constitutional: Negative.   HENT: Negative.   Eyes: Negative.   Respiratory: Negative.   Cardiovascular: Negative.   Endocrine: Negative.   Musculoskeletal: Negative.   Neurological: Negative.   Hematological: Negative.   Psychiatric/Behavioral: Negative.   All other systems reviewed and are negative.    Objective: Vital Signs: LMP 05/11/2013   Physical Exam  Constitutional: She is oriented to person, place, and  time. She appears well-developed and well-nourished.  HENT:  Head: Normocephalic and atraumatic.  Eyes: EOM are normal.  Neck: Neck supple.  Pulmonary/Chest: Effort normal.  Abdominal: Soft.  Neurological: She is alert and oriented to person, place, and time.  Skin: Skin is warm. Capillary refill takes less than 2 seconds.  Psychiatric: She has a normal mood and affect. Her behavior is normal. Judgment and thought content normal.  Nursing note and vitals reviewed.   Ortho Exam Left ankle exam shows moderate swelling.  There is no ecchymosis.  Foot is neurovascular intact.  Motor and sensory function.  She is slightly tender over the distal fibula. Specialty Comments:  No specialty comments available.  Imaging: Xr Ankle Complete Left  Result Date: 10/25/2017 Stable alignment of fracture.  Evidence of early healing    PMFS History: Patient Active Problem List   Diagnosis Date Noted  . Nondisplaced fracture of lateral malleolus of left fibula, initial encounter for closed fracture 10/25/2017  . Common migraine with intractable migraine 05/14/2016  . Insomnia 03/17/2013  . Headache(784.0) 02/23/2013  . Hx of yeast infection 04/20/2012  . Hx of menorrhagia 04/20/2012  . 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 Bipolar disorder ([place]) 04/20/2012  . Hx of menorrhagia 04/20/2012  . Hx of ovarian cyst 04/20/2012  . Epilepsy ([place]) 04/20/2012  . Hx of seizure disorder 04/20/2012  .  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 Hx of dysmenorrhea 04/20/2012  . 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 Hx of hypercholesterolemia 04/20/2012  . Hx of PMS (premenstrual syndrome) 04/20/2012  . Encounter for therapeutic drug monitoring 05/01/2011   Past Medical History:  Diagnosis Date  . Abnormal Pap smear   . Anxiety   . Bipolar disorder ([place])   . Dysmenorrhea   . Dysplasia   . Dysrhythmia    episodes of palpitations  . Dysuria   . H/O head injury   . Headache(784.0)   . High  blood cholesterol   . HSV-2 infection   . Hx of hypercholesterolemia   . Hx of ovarian cyst   . Hx of seizure disorder   . Mass of ovary    left side  . Menorrhagia   . Mental retardation    mild per Dr. notes in [facility]  . Mild mental retardation   . Perimenopausal vasomotor symptoms   . PMS (premenstrual syndrome)   . PMS (premenstrual syndrome)   . PMS (premenstrual syndrome)   . RLS (restless legs syndrome)   . Seizures ([place])   . Uterine mass   . Vaginal yeast infection 2007   recurrent   . Yeast vaginitis     Family History  Problem Relation Age of Onset  . Heart attack Father   . Breast cancer Sister     Past Surgical History:  Procedure Laterality Date  . ABDOMINAL HYSTERECTOMY  2013  . COLPOSCOPY    . CYSTOSCOPY Bilateral 05/30/2013   Procedure: CYSTOSCOPY;  Surgeon: Betsy Coder, MD;  Location: [facility];  Service: Gynecology;  Laterality: Bilateral;  . DILATION AND CURETTAGE OF UTERUS    . ENDOMETRIAL BIOPSY    . HYSTEROSCOPY W/ ENDOMETRIAL ABLATION    . LAPAROSCOPIC HYSTERECTOMY Right 05/30/2013   Procedure: HYSTERECTOMY TOTAL LAPAROSCOPIC;  Surgeon: Betsy Coder, MD;  Location: [facility];  Service: Gynecology;  Laterality: Right;  Right Salpingectomy  . POLYPECTOMY    . WISDOM TOOTH EXTRACTION     Social History   Occupational History  . Occupation: Unemployed  Tobacco Use  . Smoking status: Former Research scientist (life sciences)  . Smokeless tobacco: Never Used  . Tobacco comment: Quit 10 years ago.  Substance and Sexual Activity  . Alcohol use: No    Alcohol/week: 0.0 oz  . Drug use: No  . Sexual activity: Not on file

## 2017-10-26 ENCOUNTER — Telehealth (INDEPENDENT_AMBULATORY_CARE_PROVIDER_SITE_OTHER): Payer: Self-pay | Admitting: Orthopaedic Surgery

## 2017-10-26 NOTE — Telephone Encounter (Signed)
yes

## 2017-10-26 NOTE — Telephone Encounter (Signed)
Patient's mother called back stating that [facility] has not received the prescription for the shower chair.  She wanted to know if it could be faxed to them at [phone] before we closed today.

## 2017-10-26 NOTE — Telephone Encounter (Signed)
ORDER FAXED TO [facility]

## 2017-10-26 NOTE — Telephone Encounter (Addendum)
Pt mother called and she stated the pt needs a prescription for the chair that sits in the shower per [facility]. Pt mother asked if the office if they could send over the prescription to [facility].

## 2017-10-26 NOTE — Telephone Encounter (Signed)
Is this okay?

## 2017-10-26 NOTE — Telephone Encounter (Signed)
If that RX could just be faxed over to [place]. Thank you.

## 2017-11-09 ENCOUNTER — Ambulatory Visit (HOSPITAL_COMMUNITY): Payer: Self-pay | Admitting: Psychiatry

## 2017-11-09 ENCOUNTER — Telehealth: Payer: Self-pay | Admitting: Neurology

## 2017-11-09 NOTE — Telephone Encounter (Signed)
Pts insurance(pts ins hasn't changed) is no longer covering   BELSOMRA 20 MG TABS  Please call to discuss the next step

## 2017-11-10 NOTE — Telephone Encounter (Signed)
Called [facility] Deborha Payment stated pt picked up [place] on 11/01/17. No PA needed.

## 2017-11-10 NOTE — Telephone Encounter (Signed)
Called and spoke with patient. She received letter from insurance yesterday stating Belsomra will no longer be covered, not on formulary. Verified she has no changes in insurance.   She has taken Belsomra for greater than 3 years per pt with good benefit. She could not remember exact start date.  I advised we will try to do PA on medication and see if we can get it approved via insurance. I will call her back once I know more. She verbalized understanding.

## 2017-11-11 NOTE — Telephone Encounter (Signed)
Submitted PA Belsomra 20mg  on covermymeds. Key: [id number]. Waiting on determination from insurance.

## 2017-11-11 NOTE — Telephone Encounter (Signed)
In process of completing PA Belsomra on covermymeds.

## 2017-11-12 NOTE — Telephone Encounter (Signed)
Received fax notification from [facility] that [place] approved effective 10/17/17-10/18/18. Referral number: [id number].

## 2017-11-15 NOTE — Telephone Encounter (Signed)
Called and spoke with patient. Advised she should have refills left at her pharmacy. Instructed her to contact her pharmacy. She verbalized understanding and appreciation for call.

## 2017-11-15 NOTE — Telephone Encounter (Signed)
Pt called checking on status of PA. I advised her it had been approved. Has RX been sent to pharmacy?

## 2017-11-16 ENCOUNTER — Other Ambulatory Visit: Payer: Self-pay | Admitting: Neurology

## 2017-11-17 NOTE — Telephone Encounter (Signed)
Fax confirmation received clonazepam [place] [phone].

## 2017-11-18 ENCOUNTER — Encounter (HOSPITAL_COMMUNITY): Payer: Self-pay | Admitting: Emergency Medicine

## 2017-11-18 ENCOUNTER — Emergency Department (HOSPITAL_COMMUNITY): Payer: Medicare Other

## 2017-11-18 ENCOUNTER — Emergency Department (HOSPITAL_COMMUNITY)
Admission: EM | Admit: 2017-11-18 | Discharge: 2017-11-19 | Disposition: A | Discharge status: Home / Self Care | Payer: Medicare Other | Attending: Emergency Medicine | Admitting: Emergency Medicine

## 2017-11-18 DIAGNOSIS — Z87891 Personal history of nicotine dependence: Secondary | ICD-10-CM | POA: Insufficient documentation

## 2017-11-18 DIAGNOSIS — Z79899 Other long term (current) drug therapy: Secondary | ICD-10-CM | POA: Diagnosis not present

## 2017-11-18 DIAGNOSIS — R569 Unspecified convulsions: Secondary | ICD-10-CM | POA: Diagnosis present

## 2017-11-18 LAB — CBC WITH DIFFERENTIAL/PLATELET
Basophils Absolute: 0 10*3/uL (ref 0.0–0.1)
Basophils Relative: 0 %
EOS PCT: 3 %
Eosinophils Absolute: 0.2 10*3/uL (ref 0.0–0.7)
HCT: 35.9 % — ABNORMAL LOW (ref 36.0–46.0)
Hemoglobin: 11.4 g/dL — ABNORMAL LOW (ref 12.0–15.0)
LYMPHS ABS: 1.9 10*3/uL (ref 0.7–4.0)
Lymphocytes Relative: 24 %
MCH: 29 pg (ref 26.0–34.0)
MCHC: 31.8 g/dL (ref 30.0–36.0)
MCV: 91.3 fL (ref 78.0–100.0)
MONO ABS: 0.5 10*3/uL (ref 0.1–1.0)
MONOS PCT: 6 %
Neutro Abs: 5.5 10*3/uL (ref 1.7–7.7)
Neutrophils Relative %: 67 %
PLATELETS: 273 10*3/uL (ref 150–400)
RBC: 3.93 MIL/uL (ref 3.87–5.11)
RDW: 13.8 % (ref 11.5–15.5)
WBC: 8.3 10*3/uL (ref 4.0–10.5)

## 2017-11-18 LAB — BASIC METABOLIC PANEL
Anion gap: 10 (ref 5–15)
BUN: 13 mg/dL (ref 6–20)
CHLORIDE: 101 mmol/L (ref 101–111)
CO2: 28 mmol/L (ref 22–32)
Calcium: 8.7 mg/dL — ABNORMAL LOW (ref 8.9–10.3)
Creatinine, Ser: 0.67 mg/dL (ref 0.44–1.00)
GFR calc Af Amer: >60 mL/min (ref >60)
GFR calc non Af Amer: >60 mL/min (ref >60)
GLUCOSE: 87 mg/dL (ref 65–99)
POTASSIUM: 3.5 mmol/L (ref 3.5–5.1)
SODIUM: 139 mmol/L (ref 135–145)

## 2017-11-18 LAB — ETHANOL: Alcohol, Ethyl (B): <10 mg/dL (ref <10)

## 2017-11-18 LAB — I-STAT BETA HCG BLOOD, ED (MC, WL, AP ONLY)

## 2017-11-18 LAB — CBG MONITORING, ED: Glucose-Capillary: 93 mg/dL (ref 65–99)

## 2017-11-18 LAB — PHENOBARBITAL LEVEL: PHENOBARBITAL: 8.9 ug/mL — AB (ref 15.0–30.0)

## 2017-11-18 MED ORDER — SODIUM CHLORIDE 0.9 % IV SOLN
INTRAVENOUS | Status: DC
  Administered 2017-11-19: via INTRAVENOUS

## 2017-11-18 MED ORDER — SODIUM CHLORIDE 0.9 % IV BOLUS (SEPSIS)
1000.0000 mL | Freq: Once | INTRAVENOUS | Status: AC
  Administered 2017-11-18: 1000 mL via INTRAVENOUS

## 2017-11-18 NOTE — ED Notes (Signed)
Patient transported to CT 

## 2017-11-18 NOTE — ED Triage Notes (Signed)
Pt arrives via EMS from home with reports of seizure like activity. Per family, pt stated "oh no not again" and started shaking all over. EMS have 4 mg zofran for nausea. Pt has slurred speech and according to family, this is baseline.

## 2017-11-19 ENCOUNTER — Telehealth: Payer: Self-pay | Admitting: *Deleted

## 2017-11-19 DIAGNOSIS — R569 Unspecified convulsions: Secondary | ICD-10-CM | POA: Diagnosis not present

## 2017-11-19 MED ORDER — LINEZOLID 600 MG/300ML IV SOLN
600.0000 mg | Freq: Once | INTRAVENOUS | Status: DC
  Filled 2017-11-19: qty 300

## 2017-11-19 MED ORDER — PHENOBARBITAL SODIUM 130 MG/ML IJ SOLN
1040.0000 mg | Freq: Once | INTRAMUSCULAR | Status: DC
  Filled 2017-11-19: qty 8

## 2017-11-19 MED ORDER — SODIUM CHLORIDE 0.9 % IV SOLN: 750.0000 mg | Freq: Three times a day (TID) | INTRAVENOUS | Status: DC

## 2017-11-19 MED ORDER — SODIUM CHLORIDE 0.9 % IV SOLN
1040.0000 mg | Freq: Once | INTRAVENOUS | Status: AC
  Administered 2017-11-19: 1040 mg via INTRAVENOUS
  Filled 2017-11-19: qty 8

## 2017-11-19 NOTE — Telephone Encounter (Signed)
LVM for pt to call and schedule revisit within the next couple weeks with one of our nurse practitioners per Dr. Jannifer Franklin request. Advised she can schedule with phone staff when she calls back. Gave [facility] phone number

## 2017-11-19 NOTE — Discharge Instructions (Signed)
Please make sure to take your phenobarbital as prescribed.  Call and follow up closely with your neurologist next week for further care.

## 2017-11-19 NOTE — ED Provider Notes (Signed)
[facility] EMERGENCY DEPARTMENT Provider Note   CSN: [id number] Arrival date & time: 11/18/17  2148     History   Chief Complaint Chief Complaint  Patient presents with  . Seizures    HPI Teresa Lawrence is a 52 y.o. female.  HPI    52 year old female with known history of seizure currently on phenobarbital brought here accompanied by family member for a witnessed seizure.  The patient's mom who is at bedside report approximately 2 hours ago, patient was sitting watching TV when she mumbles" oh no, not again" follows by an episode of generalized body convulsions lasting for approximately 3-5 minutes which follows a postictal state.  EMS arrived and gave patient Zofran and brought her here.  She appears to be more cohesive currently.  Patient did not complain of any active pain and denies any injury from the fall.  She denies tongue biting or having bowel bladder incontinence.  She reports she has been compliant with her medication however her family noticed that she has more pills in her pills bottle.  No recent sickness.  Denies alcohol or drugs abuse.  She did report having a left ankle fracture more than a month ago but states that has been healing fine.    Past Medical History:  Diagnosis Date  . Abnormal Pap smear   . Anxiety   . Bipolar disorder ([place])   . Dysmenorrhea   . Dysplasia   . Dysrhythmia    episodes of palpitations  . Dysuria   . H/O head injury   . Headache(784.0)   . High blood cholesterol   . HSV-2 infection   . Hx of hypercholesterolemia   . Hx of ovarian cyst   . Hx of seizure disorder   . Mass of ovary    left side  . Menorrhagia   . Mental retardation    mild per Dr. notes in [facility]  . Mild mental retardation   . Perimenopausal vasomotor symptoms   . PMS (premenstrual syndrome)   . PMS (premenstrual syndrome)   . PMS (premenstrual syndrome)   . RLS (restless legs syndrome)   . Seizures ([place])   . Uterine mass   . Vaginal  yeast infection 2007   recurrent   . Yeast vaginitis     Patient Active Problem List   Diagnosis Date Noted  . Nondisplaced fracture of lateral malleolus of left fibula, initial encounter for closed fracture 10/25/2017  . Common migraine with intractable migraine 05/14/2016  . Insomnia 03/17/2013  . Headache(784.0) 02/23/2013  . Hx of yeast infection 04/20/2012  . Hx of menorrhagia 04/20/2012  . 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 Bipolar disorder ([place]) 04/20/2012  . Hx of menorrhagia 04/20/2012  . Hx of ovarian cyst 04/20/2012  . Epilepsy ([place]) 04/20/2012  . Hx of seizure disorder 04/20/2012  . 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 Hx of dysmenorrhea 04/20/2012  . 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 Hx of hypercholesterolemia 04/20/2012  . Hx of PMS (premenstrual syndrome) 04/20/2012  . Encounter for therapeutic drug monitoring 05/01/2011    Past Surgical History:  Procedure Laterality Date  . ABDOMINAL HYSTERECTOMY  2013  . COLPOSCOPY    . CYSTOSCOPY Bilateral 05/30/2013   Procedure: CYSTOSCOPY;  Surgeon: Betsy Coder, MD;  Location: [facility];  Service: Gynecology;  Laterality: Bilateral;  . DILATION AND CURETTAGE OF UTERUS    . ENDOMETRIAL BIOPSY    . HYSTEROSCOPY W/ ENDOMETRIAL ABLATION    .  LAPAROSCOPIC HYSTERECTOMY Right 05/30/2013   Procedure: HYSTERECTOMY TOTAL LAPAROSCOPIC;  Surgeon: Betsy Coder, MD;  Location: [facility];  Service: Gynecology;  Laterality: Right;  Right Salpingectomy  . POLYPECTOMY    . WISDOM TOOTH EXTRACTION      OB History    No data available       Home Medications    Prior to Admission medications   Medication Sig Start Date End Date Taking? Authorizing Provider  acetaminophen (TYLENOL) 500 MG tablet Take 500 mg by mouth every 6 (six) hours as needed (for headaches).   Yes [provider]  acyclovir (ZOVIRAX) 200 MG capsule Take 200 mg by mouth 3 (three) times daily as needed (fever blisters).    Yes  [provider]  atorvastatin (LIPITOR) 20 MG tablet Take 1 tablet (20 mg total) by mouth daily. 08/04/17  Yes [name], MD  BELSOMRA 20 MG TABS TAKE 1 TABLET BY MOUTH ONCE DAILY AT BEDTIME AS NEEDED Patient taking differently: Take 20 mg by mouth at bedtime as needed for insomnia 07/08/17  Yes [name], MD  Boric Acid POWD Place 1 capsule vaginally See admin instructions. As directed for yeast infections   Yes [provider]  clonazePAM (KLONOPIN) 2 MG tablet TAKE 1 TABLET BY MOUTH TWICE DAILY AND 2 TABLETS AT BEDTIME Patient taking differently: Take 2 mg by mouth in the morning then 2 mg in the afternoon then 4 mg at bedtime 11/16/17  Yes [name], MD  diphenhydrAMINE (BENADRYL) 25 MG tablet Take 25 mg by mouth every 6 (six) hours as needed for allergies.   Yes [provider]  furosemide (LASIX) 40 MG tablet Take 40 mg by mouth daily as needed for fluid or edema.  07/24/13  Yes [provider]  gabapentin (NEURONTIN) 100 MG capsule TAKE 1 CAPSULE BY MOUTH THREE TIMES DAILY Patient taking differently: Take 100 mg by mouth once a day as needed for head pain 11/16/17  Yes [name], MD  HYDROcodone-acetaminophen (NORCO) 5-325 MG tablet Take 1-2 tablets by mouth daily as needed. Patient taking differently: Take 1 tablet by mouth daily as needed (for pain).  10/25/17  Yes Leandrew Koyanagi, MD  hydrocortisone cream 1 % Apply 1 application topically as needed (itching).   Yes [provider]  loperamide (IMODIUM) 2 MG capsule Take 2 mg by mouth 4 (four) times daily as needed for diarrhea or loose stools.   Yes [provider]  magic mouthwash SOLN 15 mLs by Mouth Rinse route 2 (two) times daily as needed for mouth pain.  11/08/17  Yes [provider]  pantoprazole (PROTONIX) 40 MG tablet Take 40 mg by mouth 2 (two) times daily as needed (for reflux symptoms).    Yes [provider]  PHENobarbital (LUMINAL)  64.8 MG tablet TAKE 1 TABLET BY MOUTH TWICE DAILY Patient taking differently: Take 64.8 mg by mouth two times a day 06/28/17  Yes [name], MD  pramipexole (MIRAPEX) 0.25 MG tablet TAKE 1 TABLET BY MOUTH TWICE DAILY DURING  THE  DAY,  AND  TWO  TABLETS  AT  NIGHT Patient taking differently: Take 0.25-0.5 mg by mouth See admin instructions. 0.25 mg by mouth in the morning then 0.25 mg in the afternoon then 0.5 mg at bedtime 11/16/17  Yes [name], MD  promethazine (PHENERGAN) 25 MG tablet Take 25 mg by mouth every 6 (six) hours as needed for nausea or vomiting.   Yes  [provider]  QUEtiapine (SEROQUEL) 100 MG tablet TAKE ONE & ONE-HALF TABLETS BY MOUTH AT BEDTIME Patient taking differently: Take 100 mg by mouth at bedtime.  08/04/17  Yes [name], MD  DIPHENHYDRAMINE HCL, SLEEP, PO Take 1 tablet by mouth at bedtime as needed (for sleep).    [provider]  gabapentin (NEURONTIN) 100 MG capsule Take 2 capsules (200 mg total) by mouth 3 (three) times daily. Patient not taking: Reported on 11/18/2017 08/04/17   [name], MD  HYDROcodone-acetaminophen (NORCO/VICODIN) 5-325 MG tablet Take 1 tablet by mouth every 6 (six) hours as needed. Patient not taking: Reported on 11/18/2017 10/12/17   Drenda Freeze, MD  ibuprofen (ADVIL,MOTRIN) 600 MG tablet Take 1 tablet (600 mg total) by mouth every 6 (six) hours as needed (mild pain). Patient not taking: Reported on 11/18/2017 05/31/13   Earnstine Regal, PA-C    Family History Family History  Problem Relation Age of Onset  . Heart attack Father   . Breast cancer Sister     Social History Social History   Tobacco Use  . Smoking status: Former Research scientist (life sciences)  . Smokeless tobacco: Never Used  . Tobacco comment: Quit 10 years ago.  Substance Use Topics  . Alcohol use: No    Alcohol/week: 0.0 oz  . Drug use: No     Allergies   Requip [ropinirole hcl]; Topamax [topiramate]; and Zonegran  [zonisamide]   Review of Systems Review of Systems  All other systems reviewed and are negative.    Physical Exam Updated Vital Signs BP 113/75   Pulse 83   Resp 16   Ht 5\' 2"  (1.575 m)   LMP 05/11/2013   SpO2 96%   BMI 42.80 kg/m   Physical Exam  Constitutional: She is oriented to person, place, and time. She appears well-developed and well-nourished. No distress.  HENT:  Head: Normocephalic and atraumatic.  Tongue: small abrasion to right lateral tongue  Eyes: Conjunctivae and EOM are normal. Pupils are equal, round, and reactive to light.  Neck: Normal range of motion. Neck supple.  No nuchal rigidity  Cardiovascular: Normal rate and regular rhythm.  Pulmonary/Chest: Effort normal and breath sounds normal.  Abdominal: Soft. Bowel sounds are normal. She exhibits no distension. There is no tenderness.  Musculoskeletal: She exhibits no edema or tenderness.  Neurological: She is alert and oriented to person, place, and time. She has normal strength. No cranial nerve deficit or sensory deficit. Coordination normal. GCS eye subscore is 4. GCS verbal subscore is 5. GCS motor subscore is 6.  Skin: No rash noted.  Psychiatric: She has a normal mood and affect.  Nursing note and vitals reviewed.    ED Treatments / Results  Labs (all labs ordered are listed, but only abnormal results are displayed) Labs Reviewed  BASIC METABOLIC PANEL - Abnormal; Notable for the following components:      Result Value   Calcium 8.7 (*)    All other components within normal limits  CBC WITH DIFFERENTIAL/PLATELET - Abnormal; Notable for the following components:   Hemoglobin 11.4 (*)    HCT 35.9 (*)    All other components within normal limits  PHENOBARBITAL LEVEL - Abnormal; Notable for the following components:   Phenobarbital 8.9 (*)    All other components within normal limits  ETHANOL  RAPID URINE DRUG SCREEN, HOSP PERFORMED  URINALYSIS, ROUTINE W REFLEX MICROSCOPIC  CBG  MONITORING, ED  CBG MONITORING, ED  I-STAT BETA HCG BLOOD, ED ([facility],  [facility], [facility] ONLY)  CBG MONITORING, ED    EKG  EKG Interpretation None       Radiology Ct Head Wo Contrast  Result Date: 11/18/2017 CLINICAL DATA:  Seizure-like activity. Previous history of seizures. EXAM: CT HEAD WITHOUT CONTRAST TECHNIQUE: Contiguous axial images were obtained from the base of the skull through the vertex without intravenous contrast. COMPARISON:  10/12/2017 FINDINGS: Brain: No evidence of acute infarction, hemorrhage, hydrocephalus, extra-axial collection or mass lesion/mass effect. Vascular: No hyperdense vessel or unexpected calcification. Skull: Normal. Negative for fracture or focal lesion. Sinuses/Orbits: No acute finding. Other: None. IMPRESSION: No acute intracranial abnormalities. Electronically Signed   By: Lucienne Capers M.D.   On: 11/18/2017 23:22    Procedures Procedures (including critical care time)  Medications Ordered in ED Medications  sodium chloride 0.9 % bolus 1,000 mL (0 mLs Intravenous Stopped 11/18/17 2350)    And  0.9 %  sodium chloride infusion ( Intravenous New Bag/Given 11/19/17 0027)  PHENObarbital (LUMINAL) 1,040 mg in sodium chloride 0.9 % 100 mL IVPB (not administered)     Initial Impression / Assessment and Plan / ED Course  I have reviewed the triage vital signs and the nursing notes.  Pertinent labs & imaging results that were available during my care of the patient were reviewed by me and considered in my medical decision making (see chart for details).     BP 113/75   Pulse 83   Resp 16   Ht 5\' 2"  (1.575 m)   LMP 05/11/2013   SpO2 96%   BMI 42.80 kg/m    Final Clinical Impressions(s) / ED Diagnoses   Final diagnoses:  Seizure [facility])    ED Discharge Orders    None     1:16 AM Patient with known history of seizure here for evaluation of a witnessed seizure today.  She is back to her baseline.  She takes phenobarbital for seizure however her level  is subtherapeutic.  A pill count in her bottle shows that she is not taking enough.  BP 113/75   Pulse 83   Resp 16   Ht 5\' 2"  (1.575 m)   LMP 05/11/2013   SpO2 96%   BMI 42.80 kg/m   1:17 AM Phenobarbital level is subtherapeutic at 8.9.  Appreciate consultation from on call neurology who recommend loading dose of phenobarbital at 10mg /kg.  Pt will need to f/u with her neurologist for further management of her health.  She will need to take her medication as prescribed.  The remainder of her work up are reassuring.  Pt voice understanding and agrees with plan.  She is currently receiving her IV dose.  Pt sign out to oncoming provider, Rachael Fee, PA-C who will dispo pt pending the completion of her IV med.    Domenic Moras, PA-C 11/19/17 [id number]    Veryl Speak, MD 11/19/17 [phone]

## 2017-11-22 ENCOUNTER — Encounter (INDEPENDENT_AMBULATORY_CARE_PROVIDER_SITE_OTHER): Payer: Self-pay | Admitting: Orthopaedic Surgery

## 2017-11-22 ENCOUNTER — Ambulatory Visit (INDEPENDENT_AMBULATORY_CARE_PROVIDER_SITE_OTHER): Payer: Medicare Other | Admitting: Orthopaedic Surgery

## 2017-11-22 DIAGNOSIS — S8265XA Nondisplaced fracture of lateral malleolus of left fibula, initial encounter for closed fracture: Secondary | ICD-10-CM

## 2017-11-22 NOTE — Progress Notes (Signed)
[facility]  PATIENT: Teresa Lawrence DOB: 18-Mar-1966   REASON FOR VISIT: Follow-up for seizure disorder recent ER visit for seizure, migraines HISTORY FROM: Patient and mom   HISTORY OF PRESENT ILLNESS:UPDATE 2/5/2019CM Teresa Lawrence, 52 year old female returns for follow-up with a history of headaches and seizure disorder.  Patient was seen in the emergency room on 11/18/2017 for seizure.  He has not was described by mother as a episode of generalized body convulsions lasting for approximately 3-5 minutes.  She denies any bowel or bladder incontinence but did say she bit  her tongue.  She denies missing any doses of her phenobarbital however phenobarbital level was 8.9 which is subtherapeutic.  She also has a history of migraines but only takes gabapentin  as needed and not as prescribed.  She says her headaches are in good control.  She has not seen a psychiatrist for her bipolar disorder as requested by Dr. Jannifer Franklin.  She returns for reevaluation. 08/04/17 KWMs. Fry is a 52 year old right-handed white female with a history of bipolar disorder and a history of frequent headaches and a history of seizures. The patient last had a seizure in July 2017. She has been on phenobarbital for a number of years with excellent control. Her levels were somewhat low, suggesting she had missed several doses of her medication. The patient continues to complain of problems with severe mood swings and irritability. She has not been seen through psychiatry. She also complains of headaches that are almost daily in nature, she has been on gabapentin in low dose taking 100 mg 3 times daily. She is tolerating the medication fairly well. She has had some slight swelling in the ankles, she also reports some intermittent problems with feeling dizzy and with a tendency to fall, she last fell within a week prior to this evaluation. The patient does not sleep well at night, she remains sleepy during the day. She  returns for an evaluation.   REVIEW OF SYSTEMS: Full 14 system review of systems performed and notable only for those listed, all others are neg:  Constitutional: Fatigue Cardiovascular: neg Ear/Nose/Throat: neg  Skin: neg Eyes: neg Respiratory: neg Gastroitestinal: neg  Hematology/Lymphatic: neg  Endocrine: neg Musculoskeletal:neg Allergy/Immunology: neg Neurological: Seizure history of headaches  Psychiatric: Depression Sleep : neg   ALLERGIES: Allergies  Allergen Reactions  . Requip [Ropinirole Hcl] Nausea And Vomiting  . Topamax [Topiramate] Nausea Only  . Zonegran [Zonisamide] Nausea Only    HOME MEDICATIONS: Outpatient Medications Prior to Visit  Medication Sig Dispense Refill  . acetaminophen (TYLENOL) 500 MG tablet Take 500 mg by mouth every 6 (six) hours as needed (for headaches).    Marland Kitchen acyclovir (ZOVIRAX) 200 MG capsule Take 200 mg by mouth 3 (three) times daily as needed (fever blisters).     Marland Kitchen atorvastatin (LIPITOR) 20 MG tablet Take 1 tablet (20 mg total) by mouth daily.    . BELSOMRA 20 MG TABS TAKE 1 TABLET BY MOUTH ONCE DAILY AT BEDTIME AS NEEDED (Patient taking differently: Take 20 mg by mouth at bedtime as needed for insomnia) 30 tablet 5  . Boric Acid POWD Place 1 capsule vaginally See admin instructions. As directed for yeast infections    . clonazePAM (KLONOPIN) 2 MG tablet TAKE 1 TABLET BY MOUTH TWICE DAILY AND 2 TABLETS AT BEDTIME (Patient taking differently: Take 2 mg by mouth in the morning then 2 mg in the afternoon then 4 mg at bedtime) 120 tablet 3  . diphenhydrAMINE (BENADRYL) 25  MG tablet Take 25 mg by mouth every 6 (six) hours as needed for allergies.    Marland Kitchen DIPHENHYDRAMINE HCL, SLEEP, PO Take 1 tablet by mouth at bedtime as needed (for sleep).    . furosemide (LASIX) 40 MG tablet Take 40 mg by mouth daily as needed for fluid or edema.     . gabapentin (NEURONTIN) 100 MG capsule TAKE 1 CAPSULE BY MOUTH THREE TIMES DAILY (Patient taking differently:  Take 100 mg by mouth once a day as needed for head pain) 90 capsule 3  . HYDROcodone-acetaminophen (NORCO) 5-325 MG tablet Take 1-2 tablets by mouth daily as needed. (Patient taking differently: Take 1 tablet by mouth daily as needed (for pain). ) 30 tablet 0  . hydrocortisone cream 1 % Apply 1 application topically as needed (itching).    Marland Kitchen ibuprofen (ADVIL,MOTRIN) 600 MG tablet Take 1 tablet (600 mg total) by mouth every 6 (six) hours as needed (mild pain). 30 tablet 1  . loperamide (IMODIUM) 2 MG capsule Take 2 mg by mouth 4 (four) times daily as needed for diarrhea or loose stools.    . magic mouthwash SOLN 15 mLs by Mouth Rinse route 2 (two) times daily as needed for mouth pain.   3  . pantoprazole (PROTONIX) 40 MG tablet Take 40 mg by mouth 2 (two) times daily as needed (for reflux symptoms).     Marland Kitchen PHENobarbital (LUMINAL) 64.8 MG tablet TAKE 1 TABLET BY MOUTH TWICE DAILY (Patient taking differently: Take 64.8 mg by mouth two times a day) 60 tablet 5  . pramipexole (MIRAPEX) 0.25 MG tablet TAKE 1 TABLET BY MOUTH TWICE DAILY DURING  THE  DAY,  AND  TWO  TABLETS  AT  NIGHT (Patient taking differently: Take 0.25-0.5 mg by mouth See admin instructions. 0.25 mg by mouth in the morning then 0.25 mg in the afternoon then 0.5 mg at bedtime) 120 tablet 5  . promethazine (PHENERGAN) 25 MG tablet Take 25 mg by mouth every 6 (six) hours as needed for nausea or vomiting.    Marland Kitchen QUEtiapine (SEROQUEL) 100 MG tablet TAKE ONE & ONE-HALF TABLETS BY MOUTH AT BEDTIME (Patient taking differently: Take 100 mg by mouth at bedtime. ) 45 tablet 11  . gabapentin (NEURONTIN) 100 MG capsule Take 2 capsules (200 mg total) by mouth 3 (three) times daily. (Patient not taking: Reported on 11/23/2017) 180 capsule 5  . HYDROcodone-acetaminophen (NORCO/VICODIN) 5-325 MG tablet Take 1 tablet by mouth every 6 (six) hours as needed. (Patient not taking: Reported on 11/23/2017) 8 tablet 0   No facility-administered medications prior to  visit.     PAST MEDICAL HISTORY: Past Medical History:  Diagnosis Date  . Abnormal Pap smear   . Anxiety   . Bipolar disorder ([place])   . Dysmenorrhea   . Dysplasia   . Dysrhythmia    episodes of palpitations  . Dysuria   . H/O head injury   . Headache(784.0)   . High blood cholesterol   . HSV-2 infection   . Hx of hypercholesterolemia   . Hx of ovarian cyst   . Hx of seizure disorder   . Mass of ovary    left side  . Menorrhagia   . Mental retardation    mild per Dr. notes in [place]  . Mild mental retardation   . Perimenopausal vasomotor symptoms   . PMS (premenstrual syndrome)   . PMS (premenstrual syndrome)   . PMS (premenstrual syndrome)   . RLS (restless legs syndrome)   .  Seizures ([place])   . Uterine mass   . Vaginal yeast infection 2007   recurrent   . Yeast vaginitis     PAST SURGICAL HISTORY: Past Surgical History:  Procedure Laterality Date  . ABDOMINAL HYSTERECTOMY  2013  . COLPOSCOPY    . CYSTOSCOPY Bilateral 05/30/2013   Procedure: CYSTOSCOPY;  Surgeon: Betsy Coder, MD;  Location: [facility];  Service: Gynecology;  Laterality: Bilateral;  . DILATION AND CURETTAGE OF UTERUS    . ENDOMETRIAL BIOPSY    . HYSTEROSCOPY W/ ENDOMETRIAL ABLATION    . LAPAROSCOPIC HYSTERECTOMY Right 05/30/2013   Procedure: HYSTERECTOMY TOTAL LAPAROSCOPIC;  Surgeon: Betsy Coder, MD;  Location: [facility];  Service: Gynecology;  Laterality: Right;  Right Salpingectomy  . POLYPECTOMY    . WISDOM TOOTH EXTRACTION      FAMILY HISTORY: Family History  Problem Relation Age of Onset  . Heart attack Father   . Breast cancer Sister     SOCIAL HISTORY: Social History   Socioeconomic History  . Marital status: Single    Spouse name: Not on file  . Number of children: 0  . Years of education: HS  . Highest education level: Not on file  Social Needs  . Financial resource strain: Not on file  . Food insecurity - worry: Not on file  . Food insecurity - inability: Not on file  .  Transportation needs - medical: Not on file  . Transportation needs - non-medical: Not on file  Occupational History  . Occupation: Unemployed  Tobacco Use  . Smoking status: Former Research scientist (life sciences)  . Smokeless tobacco: Never Used  . Tobacco comment: Quit 10 years ago.  Substance and Sexual Activity  . Alcohol use: No    Alcohol/week: 0.0 oz  . Drug use: No  . Sexual activity: Not on file  Other Topics Concern  . Not on file  Social History Narrative   Patient lives at home alone with her one dog and four cats.   Disabled   Education high school.   Right handed.        PHYSICAL EXAM  Vitals:   11/23/17 1014  BP: 114/76  Pulse: 79  Weight: 234 lb (106.1 kg)   Body mass index is 42.8 kg/m.  Generalized: Well developed, obese female in no acute distress  Head: normocephalic and atraumatic,. Oropharynx benign  Neck: Supple,  Musculoskeletal: No deformity   Neurological examination   Mentation: Alert oriented to time, place, history taking. Attention span and concentration appropriate.  Follows all commands speech and language fluent.   Cranial nerve II-XII: Pupils were equal round reactive to light extraocular movements were full, visual field were full on confrontational test. Facial sensation and strength were normal. hearing was intact to finger rubbing bilaterally. Uvula tongue midline. head turning and shoulder shrug were normal and symmetric.Tongue protrusion into cheek strength was normal. Motor: normal bulk and tone, full strength in the BUE, BLE,  Sensory: normal and symmetric to light touch, pinprick, and  Vibration, in the upper and lower extremities Coordination: finger-nose-finger, heel-to-shin bilaterally, no dysmetria Reflexes: Symmetric upper and lower plantar responses were flexor bilaterally. Gait and Station: Rising up from seated position without assistance,  normal stance,   Tandem gait is unsteady.  Ambulates with single-point cane  DIAGNOSTIC DATA (LABS,  IMAGING, TESTING) - I reviewed patient records, labs, notes, testing and imaging myself where available.  Lab Results  Component Value Date   WBC 8.3 11/18/2017   HGB 11.4 (L) 11/18/2017  HCT 35.9 (L) 11/18/2017   MCV 91.3 11/18/2017   PLT 273 11/18/2017      Component Value Date/Time   NA 139 11/18/2017 2235   NA 145 (H) 08/04/2017 1427   K 3.5 11/18/2017 2235   CL 101 11/18/2017 2235   CO2 28 11/18/2017 2235   GLUCOSE 87 11/18/2017 2235   BUN 13 11/18/2017 2235   BUN 9 08/04/2017 1427   CREATININE 0.67 11/18/2017 2235   CALCIUM 8.7 (L) 11/18/2017 2235   PROT 7.2 10/12/2017 1822   PROT 8.0 08/04/2017 1427   ALBUMIN 3.5 10/12/2017 1822   ALBUMIN 4.4 08/04/2017 1427   AST 27 10/12/2017 1822   ALT 33 10/12/2017 1822   ALKPHOS 101 10/12/2017 1822   BILITOT 0.6 10/12/2017 1822   BILITOT 0.2 08/04/2017 1427   GFRNONAA >60 11/18/2017 2235   GFRAA >60 11/18/2017 2235    ASSESSMENT AND PLAN  52 y.o. year old female  has a past medical history of  Anxiety, Bipolar disorder ([place]),  Headache(784.0),  seizure disorder, here to follow-up for her recent seizures seen in the emergency room.  She reports that her headaches are improved.  She has not seen psychiatry for her bipolar disorder   Continue phenobarbital at current dose we will check level Reviewed labs from ER visit CBC and BMP and subtherapeutic phenobarbital level Continue gabapentin 200 mg 3 times a day for headache patient request medication information on gabapentin Follow-up in 4 months Dennie Bible, [facility], [facility], APRN Late entry call patient's pharmacy [facility], she has not picked up her phenobarbital since  09/16/2017.  She does have refills on the prescription [facility] [address], [place], [place] [address] [phone]

## 2017-11-22 NOTE — Progress Notes (Signed)
Patient is 10 weeks status post nondisplaced Weber a ankle fracture.  She is doing much better now.  She denies any pain.  Her physical exam is benign.  She has no tenderness.  She is now clinically healed from the fracture.  ASO brace was given today.  Recommend beginning physical therapy for strengthening.  Questions encouraged and answered.  Follow-up as needed.

## 2017-11-23 ENCOUNTER — Encounter: Payer: Self-pay | Admitting: Nurse Practitioner

## 2017-11-23 ENCOUNTER — Ambulatory Visit (INDEPENDENT_AMBULATORY_CARE_PROVIDER_SITE_OTHER): Payer: Medicare Other | Admitting: Nurse Practitioner

## 2017-11-23 ENCOUNTER — Telehealth: Payer: Self-pay | Admitting: Nurse Practitioner

## 2017-11-23 ENCOUNTER — Telehealth: Payer: Self-pay

## 2017-11-23 VITALS — BP 114/76 | HR 79 | Wt 234.0 lb

## 2017-11-23 DIAGNOSIS — G43019 Migraine without aura, intractable, without status migrainosus: Secondary | ICD-10-CM

## 2017-11-23 DIAGNOSIS — G40909 Epilepsy, unspecified, not intractable, without status epilepticus: Secondary | ICD-10-CM

## 2017-11-23 DIAGNOSIS — Z5181 Encounter for therapeutic drug level monitoring: Secondary | ICD-10-CM | POA: Diagnosis not present

## 2017-11-23 NOTE — Progress Notes (Signed)
I have read the note, and I agree with the clinical assessment and plan.  Eppie Barhorst K Peighton Mehra   

## 2017-11-23 NOTE — Telephone Encounter (Signed)
Noted. Thanks.

## 2017-11-23 NOTE — Patient Instructions (Addendum)
Continue phenobarbital at current dose we will check level Reviewed labs from ER visit Continue gabapentin 200 mg 3 times a day for headache Follow-up in 4 months  Gabapentin capsules or tablets What is this medicine? GABAPENTIN (GA ba pen tin) is used to control partial seizures in adults with epilepsy. It is also used to treat certain types of nerve pain. This medicine may be used for other purposes; ask your health care provider or pharmacist if you have questions. COMMON BRAND NAME(S): Active-PAC with Gabapentin, Gabarone, Neurontin What should I tell my health care provider before I take this medicine? They need to know if you have any of these conditions: -kidney disease -suicidal thoughts, plans, or attempt; a previous suicide attempt by you or a family member -an unusual or allergic reaction to gabapentin, other medicines, foods, dyes, or preservatives -pregnant or trying to get pregnant -breast-feeding How should I use this medicine? Take this medicine by mouth with a glass of water. Follow the directions on the prescription label. You can take it with or without food. If it upsets your stomach, take it with food.Take your medicine at regular intervals. Do not take it more often than directed. Do not stop taking except on your doctor's advice. If you are directed to break the 600 or 800 mg tablets in half as part of your dose, the extra half tablet should be used for the next dose. If you have not used the extra half tablet within 28 days, it should be thrown away. A special MedGuide will be given to you by the pharmacist with each prescription and refill. Be sure to read this information carefully each time. Talk to your pediatrician regarding the use of this medicine in children. Special care may be needed. Overdosage: If you think you have taken too much of this medicine contact a poison control center or emergency room at once. NOTE: This medicine is only for you. Do not share  this medicine with others. What if I miss a dose? If you miss a dose, take it as soon as you can. If it is almost time for your next dose, take only that dose. Do not take double or extra doses. What may interact with this medicine? Do not take this medicine with any of the following medications: -other gabapentin products This medicine may also interact with the following medications: -alcohol -antacids -antihistamines for allergy, cough and cold -certain medicines for anxiety or sleep -certain medicines for depression or psychotic disturbances -homatropine; hydrocodone -naproxen -narcotic medicines (opiates) for pain -phenothiazines like chlorpromazine, mesoridazine, prochlorperazine, thioridazine This list may not describe all possible interactions. Give your health care provider a list of all the medicines, herbs, non-prescription drugs, or dietary supplements you use. Also tell them if you smoke, drink alcohol, or use illegal drugs. Some items may interact with your medicine. What should I watch for while using this medicine? Visit your doctor or health care professional for regular checks on your progress. You may want to keep a record at home of how you feel your condition is responding to treatment. You may want to share this information with your doctor or health care professional at each visit. You should contact your doctor or health care professional if your seizures get worse or if you have any new types of seizures. Do not stop taking this medicine or any of your seizure medicines unless instructed by your doctor or health care professional. Stopping your medicine suddenly can increase your seizures or their severity.  Wear a medical identification bracelet or chain if you are taking this medicine for seizures, and carry a card that lists all your medications. You may get drowsy, dizzy, or have blurred vision. Do not drive, use machinery, or do anything that needs mental alertness  until you know how this medicine affects you. To reduce dizzy or fainting spells, do not sit or stand up quickly, especially if you are an older patient. Alcohol can increase drowsiness and dizziness. Avoid alcoholic drinks. Your mouth may get dry. Chewing sugarless gum or sucking hard candy, and drinking plenty of water will help. The use of this medicine may increase the chance of suicidal thoughts or actions. Pay special attention to how you are responding while on this medicine. Any worsening of mood, or thoughts of suicide or dying should be reported to your health care professional right away. Women who become pregnant while using this medicine may enroll in the [place] Pregnancy Registry by calling [phone]. This registry collects information about the safety of antiepileptic drug use during pregnancy. What side effects may I notice from receiving this medicine? Side effects that you should report to your doctor or health care professional as soon as possible: -allergic reactions like skin rash, itching or hives, swelling of the face, lips, or tongue -worsening of mood, thoughts or actions of suicide or dying Side effects that usually do not require medical attention (report to your doctor or health care professional if they continue or are bothersome): -constipation -difficulty walking or controlling muscle movements -dizziness -nausea -slurred speech -tiredness -tremors -weight gain This list may not describe all possible side effects. Call your doctor for medical advice about side effects. You may report side effects to FDA at 1-800-FDA-1088. Where should I keep my medicine? Keep out of reach of children. This medicine may cause accidental overdose and death if it taken by other adults, children, or pets. Mix any unused medicine with a substance like cat litter or coffee grounds. Then throw the medicine away in a sealed container like a sealed bag or a  coffee can with a lid. Do not use the medicine after the expiration date. Store at room temperature between 15 and 30 degrees C (59 and 86 degrees F). NOTE: This sheet is a summary. It may not cover all possible information. If you have questions about this medicine, talk to your doctor, pharmacist, or health care provider.  2018 Elsevier/Gold Standard (2013-12-01 15:26:50)

## 2017-11-23 NOTE — Telephone Encounter (Signed)
Patient didn't get a 4 month follow-up due to already having an apt. With Dr. Jannifer Franklin in late April.

## 2017-11-23 NOTE — Telephone Encounter (Signed)
Pt was seen today for office visit. Hoyle Sauer NP requested the nurse call [facility] to find out last time pt got phenobarbital fill. The pharmacy tech stated it was last refill 09/16/2017. He stated pt has refills on the prescription.Hoyle Sauer was made aware of this.

## 2017-11-24 ENCOUNTER — Telehealth: Payer: Self-pay | Admitting: *Deleted

## 2017-11-24 LAB — PHENOBARBITAL LEVEL: PHENOBARBITAL, SERUM: 22 ug/mL (ref 15–40)

## 2017-11-24 NOTE — Telephone Encounter (Addendum)
Called and spoke with patient. She verbalized understanding of the following: Phenobarbital  level is better at 22.  Please continue same dose. Discussed that patient does not appear to have refilled her phenobarbital recently. She has plenty of refills left. She stated she takes it daily, does not have many left and will call pharmacy for a refill. Questions answered.    ----- Message from Dennie Bible, NP sent at 11/24/2017 12:22 PM EST ----- PB  level  Better at 22.  Please continue same dose.  Please let patient know that pharmacy says she has not picked up a refill since November of last year.  She has plenty of refills

## 2017-11-26 ENCOUNTER — Ambulatory Visit: Payer: Medicare Other | Admitting: Neurology

## 2017-11-29 ENCOUNTER — Telehealth: Payer: Self-pay | Admitting: Nurse Practitioner

## 2017-11-29 NOTE — Telephone Encounter (Signed)
I called pt and she is states that the gabapetin 100mg  po TID is too much, (unsteady, feels like drunk, slurred speech).  She started taking 100mg  po BID sinc Friday and she is better, but is still having the sharp shooting pains.  She takes tylenol, no motrin, no norco (this was for her ankle).  Do you have any other recommendations?  (her notes states 200mg  po tid of the gabapentin).  She had seizure 10/2017 (from last note as well) takes phenobarbital 64.8 mg po BID.   Marland KitchenMarland Kitchen

## 2017-11-29 NOTE — Telephone Encounter (Signed)
Pt calling AE:[id number] (NEURONTIN) 100 MG capsule, pt states the about that she takes is too much.  Pt states she feels drunk and her speech is slurred an she feels unsteady. Pt would like to know if the dosage could be lowered please call

## 2017-11-29 NOTE — Telephone Encounter (Signed)
I called the patient.  The patient is not able to tolerate 300 mg of gabapentin a day, I indicated that she can take 100 mg in the morning and 200 at night, she claims that she has already done this and could not tolerate it.  She will stay on 100 mg twice daily.

## 2017-12-23 ENCOUNTER — Ambulatory Visit: Payer: Medicare Other | Admitting: Neurology

## 2018-01-11 ENCOUNTER — Other Ambulatory Visit: Payer: Self-pay | Admitting: Neurology

## 2018-01-11 NOTE — Telephone Encounter (Signed)
Faxed signed/printed rx phenobarbital to [facility]/[address] at [phone]. Received fax confirmation.

## 2018-01-23 ENCOUNTER — Other Ambulatory Visit: Payer: Self-pay | Admitting: Neurology

## 2018-02-04 ENCOUNTER — Telehealth: Payer: Self-pay | Admitting: Neurology

## 2018-02-04 NOTE — Telephone Encounter (Signed)
Pt called states she has an appt with Dr Jannifer Franklin on 4/24 and would rather see NP. I checked Hoyle Sauer last office note, it was doc for 4 mth f/u but not that pt had to see Dr Jannifer Franklin. Appt has been scheduled with NP for 4/22.  FYI

## 2018-02-04 NOTE — Progress Notes (Deleted)
[facility]  PATIENT: Teresa Lawrence DOB: December 20, 1965   REASON FOR VISIT: Follow-up for seizure disorder recent ER visit for seizure, migraines HISTORY FROM: Patient and mom   HISTORY OF PRESENT ILLNESS:UPDATE 2/5/2019CM Ms. Teresa Lawrence, 52 year old female returns for follow-up with a history of headaches and seizure disorder.  Patient was seen in the emergency room on 11/18/2017 for seizure.  He has not was described by mother as a episode of generalized body convulsions lasting for approximately 3-5 minutes.  She denies any bowel or bladder incontinence but did say she bit  her tongue.  She denies missing any doses of her phenobarbital however phenobarbital level was 8.9 which is subtherapeutic.  She also has a history of migraines but only takes gabapentin  as needed and not as prescribed.  She says her headaches are in good control.  She has not seen a psychiatrist for her bipolar disorder as requested by Dr. Jannifer Franklin.  She returns for reevaluation. 08/04/17 KWMs. [name] is a 52 year old right-handed white female with a history of bipolar disorder and a history of frequent headaches and a history of seizures. The patient last had a seizure in July 2017. She has been on phenobarbital for a number of years with excellent control. Her levels were somewhat low, suggesting she had missed several doses of her medication. The patient continues to complain of problems with severe mood swings and irritability. She has not been seen through psychiatry. She also complains of headaches that are almost daily in nature, she has been on gabapentin in low dose taking 100 mg 3 times daily. She is tolerating the medication fairly well. She has had some slight swelling in the ankles, she also reports some intermittent problems with feeling dizzy and with a tendency to fall, she last fell within a week prior to this evaluation. The patient does not sleep well at night, she remains sleepy during the day. She  returns for an evaluation.   REVIEW OF SYSTEMS: Full 14 system review of systems performed and notable only for those listed, all others are neg:  Constitutional: Fatigue Cardiovascular: neg Ear/Nose/Throat: neg  Skin: neg Eyes: neg Respiratory: neg Gastroitestinal: neg  Hematology/Lymphatic: neg  Endocrine: neg Musculoskeletal:neg Allergy/Immunology: neg Neurological: Seizure history of headaches  Psychiatric: Depression Sleep : neg   ALLERGIES: Allergies  Allergen Reactions  . Requip [Ropinirole Hcl] Nausea And Vomiting  . Topamax [Topiramate] Nausea Only  . Zonegran [Zonisamide] Nausea Only    HOME MEDICATIONS: Outpatient Medications Prior to Visit  Medication Sig Dispense Refill  . acetaminophen (TYLENOL) 500 MG tablet Take 500 mg by mouth every 6 (six) hours as needed (for headaches).    Marland Kitchen acyclovir (ZOVIRAX) 200 MG capsule Take 200 mg by mouth 3 (three) times daily as needed (fever blisters).     Marland Kitchen atorvastatin (LIPITOR) 20 MG tablet Take 1 tablet (20 mg total) by mouth daily.    . BELSOMRA 20 MG TABS TAKE 1 TABLET BY MOUTH AT BEDTIME AS NEEDED 30 tablet 5  . Boric Acid POWD Place 1 capsule vaginally See admin instructions. As directed for yeast infections    . clonazePAM (KLONOPIN) 2 MG tablet TAKE 1 TABLET BY MOUTH TWICE DAILY AND 2 TABLETS AT BEDTIME (Patient taking differently: Take 2 mg by mouth in the morning then 2 mg in the afternoon then 4 mg at bedtime) 120 tablet 3  . diphenhydrAMINE (BENADRYL) 25 MG tablet Take 25 mg by mouth every 6 (six) hours as needed for allergies.    Marland Kitchen  DIPHENHYDRAMINE HCL, SLEEP, PO Take 1 tablet by mouth at bedtime as needed (for sleep).    . furosemide (LASIX) 40 MG tablet Take 40 mg by mouth daily as needed for fluid or edema.     . gabapentin (NEURONTIN) 100 MG capsule TAKE 1 CAPSULE BY MOUTH THREE TIMES DAILY (Patient taking differently: Take 100 mg by mouth once a day as needed for head pain) 90 capsule 3  .  HYDROcodone-acetaminophen (NORCO) 5-325 MG tablet Take 1-2 tablets by mouth daily as needed. (Patient taking differently: Take 1 tablet by mouth daily as needed (for pain). ) 30 tablet 0  . hydrocortisone cream 1 % Apply 1 application topically as needed (itching).    Marland Kitchen ibuprofen (ADVIL,MOTRIN) 600 MG tablet Take 1 tablet (600 mg total) by mouth every 6 (six) hours as needed (mild pain). 30 tablet 1  . loperamide (IMODIUM) 2 MG capsule Take 2 mg by mouth 4 (four) times daily as needed for diarrhea or loose stools.    . magic mouthwash SOLN 15 mLs by Mouth Rinse route 2 (two) times daily as needed for mouth pain.   3  . pantoprazole (PROTONIX) 40 MG tablet Take 40 mg by mouth 2 (two) times daily as needed (for reflux symptoms).     Marland Kitchen PHENobarbital (LUMINAL) 64.8 MG tablet TAKE 1 TABLET BY MOUTH TWICE DAILY 60 tablet 5  . pramipexole (MIRAPEX) 0.25 MG tablet TAKE 1 TABLET BY MOUTH TWICE DAILY DURING  THE  DAY,  AND  TWO  TABLETS  AT  NIGHT (Patient taking differently: Take 0.25-0.5 mg by mouth See admin instructions. 0.25 mg by mouth in the morning then 0.25 mg in the afternoon then 0.5 mg at bedtime) 120 tablet 5  . promethazine (PHENERGAN) 25 MG tablet Take 25 mg by mouth every 6 (six) hours as needed for nausea or vomiting.    Marland Kitchen QUEtiapine (SEROQUEL) 100 MG tablet TAKE ONE & ONE-HALF TABLETS BY MOUTH AT BEDTIME (Patient taking differently: Take 100 mg by mouth at bedtime. ) 45 tablet 11   No facility-administered medications prior to visit.     PAST MEDICAL HISTORY: Past Medical History:  Diagnosis Date  . Abnormal Pap smear   . Anxiety   . Bipolar disorder ([place])   . Dysmenorrhea   . Dysplasia   . Dysrhythmia    episodes of palpitations  . Dysuria   . H/O head injury   . Headache(784.0)   . High blood cholesterol   . HSV-2 infection   . Hx of hypercholesterolemia   . Hx of ovarian cyst   . Hx of seizure disorder   . Mass of ovary    left side  . Menorrhagia   . Mental retardation     mild per Dr. notes in [facility]  . Mild mental retardation   . Perimenopausal vasomotor symptoms   . PMS (premenstrual syndrome)   . PMS (premenstrual syndrome)   . PMS (premenstrual syndrome)   . RLS (restless legs syndrome)   . Seizures ([place])   . Uterine mass   . Vaginal yeast infection 2007   recurrent   . Yeast vaginitis     PAST SURGICAL HISTORY: Past Surgical History:  Procedure Laterality Date  . ABDOMINAL HYSTERECTOMY  2013  . COLPOSCOPY    . CYSTOSCOPY Bilateral 05/30/2013   Procedure: CYSTOSCOPY;  Surgeon: Betsy Coder, MD;  Location: [facility];  Service: Gynecology;  Laterality: Bilateral;  . DILATION AND CURETTAGE OF UTERUS    .  ENDOMETRIAL BIOPSY    . HYSTEROSCOPY W/ ENDOMETRIAL ABLATION    . LAPAROSCOPIC HYSTERECTOMY Right 05/30/2013   Procedure: HYSTERECTOMY TOTAL LAPAROSCOPIC;  Surgeon: Betsy Coder, MD;  Location: [facility];  Service: Gynecology;  Laterality: Right;  Right Salpingectomy  . POLYPECTOMY    . WISDOM TOOTH EXTRACTION      FAMILY HISTORY: Family History  Problem Relation Age of Onset  . Heart attack Father   . Breast cancer Sister     SOCIAL HISTORY: Social History   Socioeconomic History  . Marital status: Single    Spouse name: Not on file  . Number of children: 0  . Years of education: HS  . Highest education level: Not on file  Occupational History  . Occupation: Unemployed  Social Needs  . Financial resource strain: Not on file  . Food insecurity:    Worry: Not on file    Inability: Not on file  . Transportation needs:    Medical: Not on file    Non-medical: Not on file  Tobacco Use  . Smoking status: Former Research scientist (life sciences)  . Smokeless tobacco: Never Used  . Tobacco comment: Quit 10 years ago.  Substance and Sexual Activity  . Alcohol use: No    Alcohol/week: 0.0 oz  . Drug use: No  . Sexual activity: Not on file  Lifestyle  . Physical activity:    Days per week: Not on file    Minutes per session: Not on file  . Stress: Not  on file  Relationships  . Social connections:    Talks on phone: Not on file    Gets together: Not on file    Attends religious service: Not on file    Active member of club or organization: Not on file    Attends meetings of clubs or organizations: Not on file    Relationship status: Not on file  . Intimate partner violence:    Fear of current or ex partner: Not on file    Emotionally abused: Not on file    Physically abused: Not on file    Forced sexual activity: Not on file  Other Topics Concern  . Not on file  Social History Narrative   Patient lives at home alone with her one dog and four cats.   Disabled   Education high school.   Right handed.        PHYSICAL EXAM  There were no vitals filed for this visit. There is no height or weight on file to calculate BMI.  Generalized: Well developed, obese female in no acute distress  Head: normocephalic and atraumatic,. Oropharynx benign  Neck: Supple,  Musculoskeletal: No deformity   Neurological examination   Mentation: Alert oriented to time, place, history taking. Attention span and concentration appropriate.  Follows all commands speech and language fluent.   Cranial nerve II-XII: Pupils were equal round reactive to light extraocular movements were full, visual field were full on confrontational test. Facial sensation and strength were normal. hearing was intact to finger rubbing bilaterally. Uvula tongue midline. head turning and shoulder shrug were normal and symmetric.Tongue protrusion into cheek strength was normal. Motor: normal bulk and tone, full strength in the BUE, BLE,  Sensory: normal and symmetric to light touch, pinprick, and  Vibration, in the upper and lower extremities Coordination: finger-nose-finger, heel-to-shin bilaterally, no dysmetria Reflexes: Symmetric upper and lower plantar responses were flexor bilaterally. Gait and Station: Rising up from seated position without assistance,  normal stance,  Tandem gait is unsteady.  Ambulates with single-point cane  DIAGNOSTIC DATA (LABS, IMAGING, TESTING) - I reviewed patient records, labs, notes, testing and imaging myself where available.  Lab Results  Component Value Date   WBC 8.3 11/18/2017   HGB 11.4 (L) 11/18/2017   HCT 35.9 (L) 11/18/2017   MCV 91.3 11/18/2017   PLT 273 11/18/2017      Component Value Date/Time   NA 139 11/18/2017 2235   NA 145 (H) 08/04/2017 1427   K 3.5 11/18/2017 2235   CL 101 11/18/2017 2235   CO2 28 11/18/2017 2235   GLUCOSE 87 11/18/2017 2235   BUN 13 11/18/2017 2235   BUN 9 08/04/2017 1427   CREATININE 0.67 11/18/2017 2235   CALCIUM 8.7 (L) 11/18/2017 2235   PROT 7.2 10/12/2017 1822   PROT 8.0 08/04/2017 1427   ALBUMIN 3.5 10/12/2017 1822   ALBUMIN 4.4 08/04/2017 1427   AST 27 10/12/2017 1822   ALT 33 10/12/2017 1822   ALKPHOS 101 10/12/2017 1822   BILITOT 0.6 10/12/2017 1822   BILITOT 0.2 08/04/2017 1427   GFRNONAA >60 11/18/2017 2235   GFRAA >60 11/18/2017 2235    ASSESSMENT AND PLAN  53 y.o. year old female  has a past medical history of  Anxiety, Bipolar disorder ([place]),  Headache(784.0),  seizure disorder, here to follow-up for her recent seizures seen in the emergency room.  She reports that her headaches are improved.  She has not seen psychiatry for her bipolar disorder   Continue phenobarbital at current dose we will check level Reviewed labs from ER visit CBC and BMP and subtherapeutic phenobarbital level Continue gabapentin 200 mg 3 times a day for headache patient request medication information on gabapentin Follow-up in 4 months Dennie Bible, [facility], APRN Late entry call patient's pharmacy [facility], she has not picked up her phenobarbital since  09/16/2017.  She does have refills on the prescription [facility] [address] [place], [place] [address] [phone]

## 2018-02-04 NOTE — Telephone Encounter (Signed)
Noted  

## 2018-02-07 ENCOUNTER — Ambulatory Visit: Payer: Medicare Other | Admitting: Nurse Practitioner

## 2018-02-09 ENCOUNTER — Ambulatory Visit: Payer: Medicare Other | Admitting: Neurology

## 2018-03-21 ENCOUNTER — Other Ambulatory Visit: Payer: Self-pay | Admitting: Family Medicine

## 2018-03-21 DIAGNOSIS — Z1231 Encounter for screening mammogram for malignant neoplasm of breast: Secondary | ICD-10-CM

## 2018-04-04 IMAGING — CT CT HEAD W/O CM
3 of 4 series · 15 of 47 positions shown, 18 images · non-contrast
Comparison: 10/12/2017

CLINICAL DATA: Seizure-like activity. Previous history of seizures.

EXAM:
CT HEAD WITHOUT CONTRAST
TECHNIQUE: Contiguous axial images were obtained from the base of the skull
through the vertex without intravenous contrast.

[Series 4: head 2.0 h70h · axial · 0.45mm/px · z∈[-136,-8]mm · 9 of 80 slices shown, 12 images]
[im 8/80  brain]
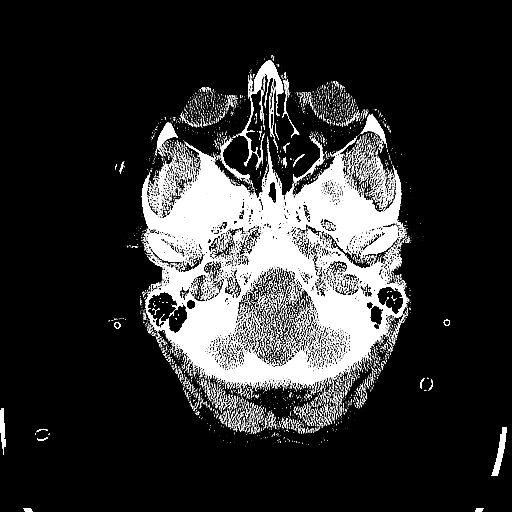
[im 8/80  bone]
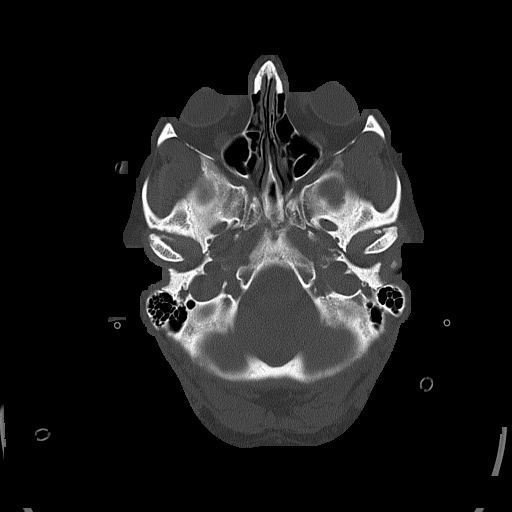
[im 16/80  brain]
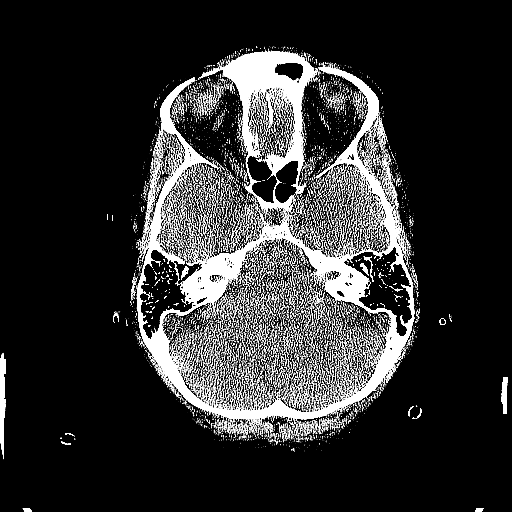
[im 24/80  brain]
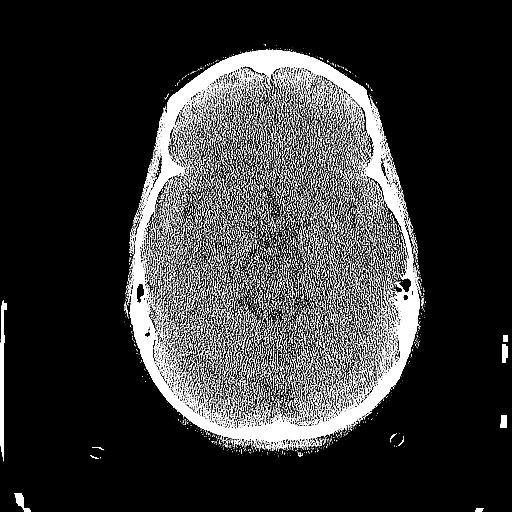
[im 32/80  brain]
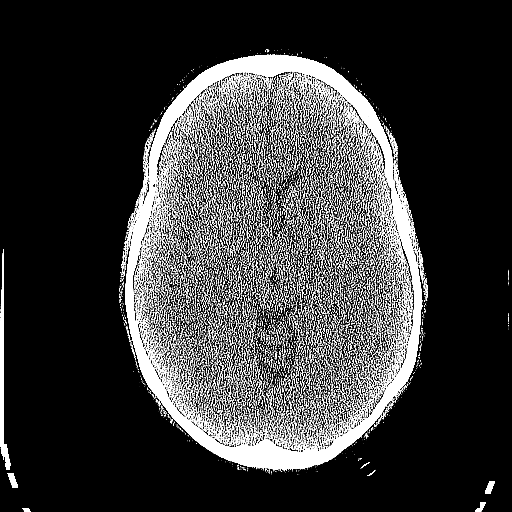
[im 40/80  brain]
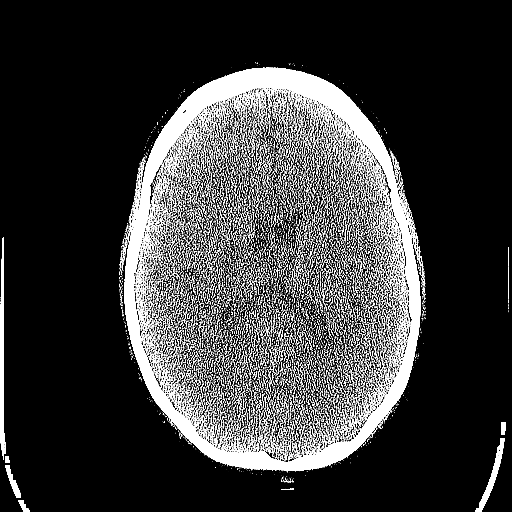
[im 40/80  bone]
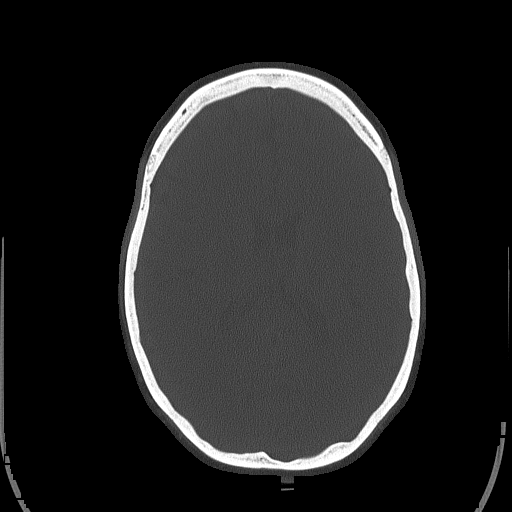
[im 48/80  brain]
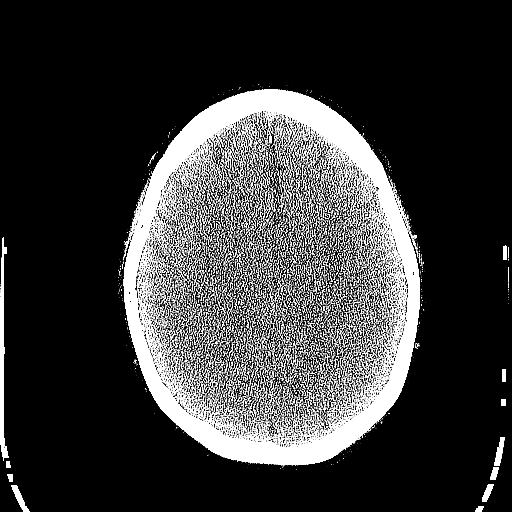
[im 56/80  brain]
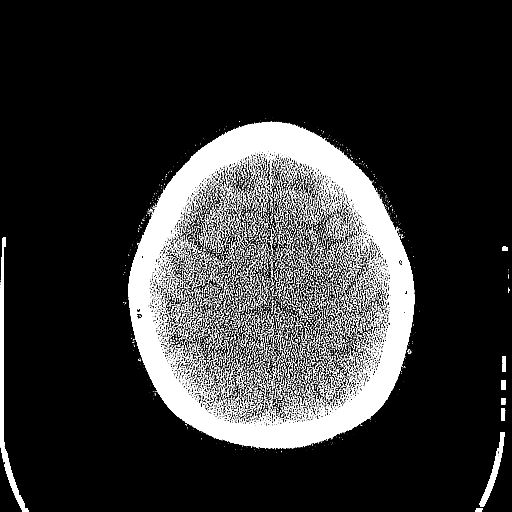
[im 64/80  brain]
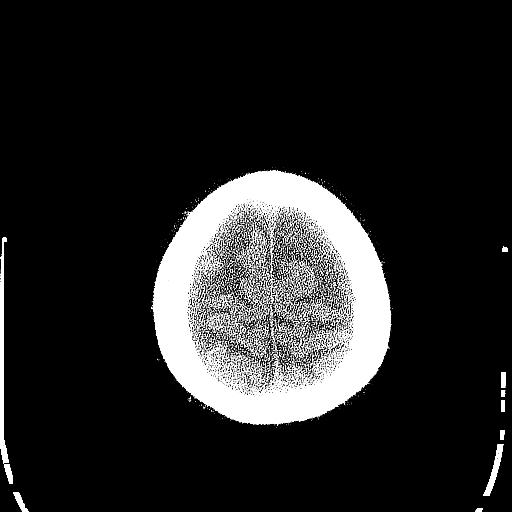
[im 72/80  brain]
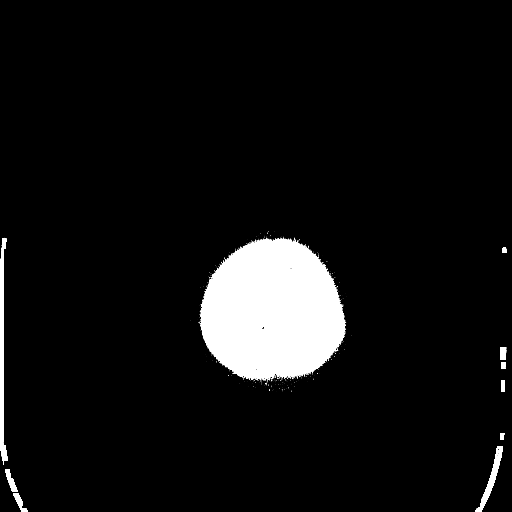
[im 72/80  bone]
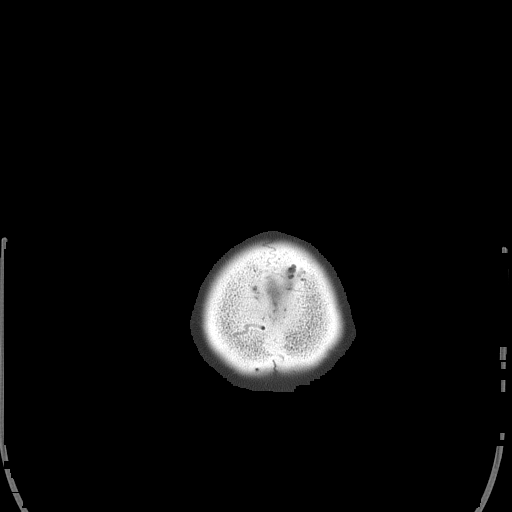

[Series 5: head 3.0 mpr cor · coronal · 0.31mm/px · 3 of 70 slices shown]
[im 24/70  brain]
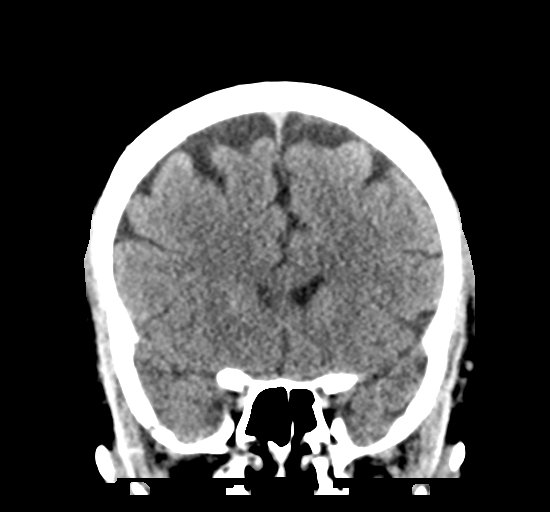
[im 31/70  brain]
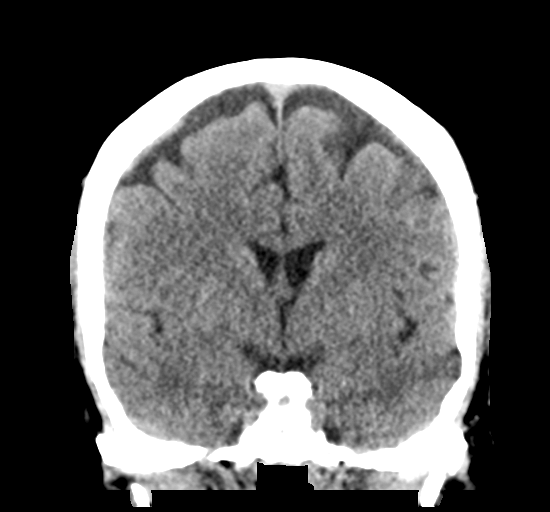
[im 39/70  brain]
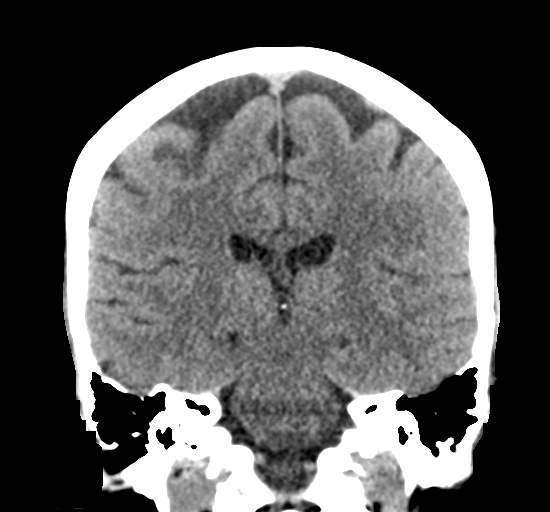

[Series 6: head 3.0 mpr sag · sagittal · 0.31mm/px · 3 of 58 slices shown]
[im 20/58  brain]
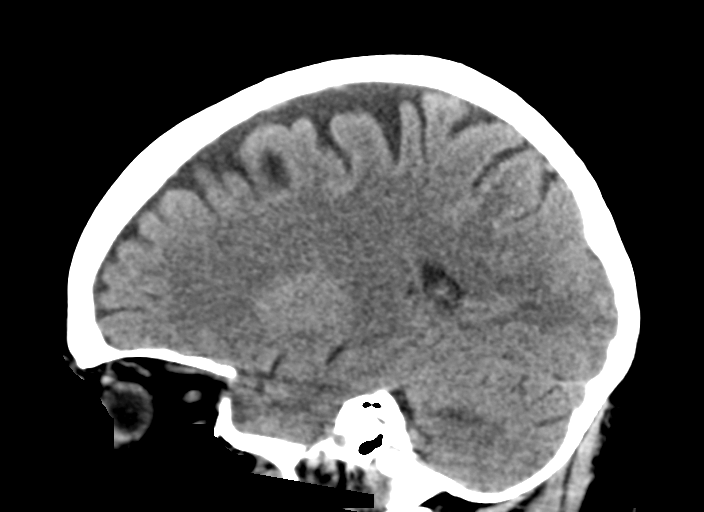
[im 29/58  brain]
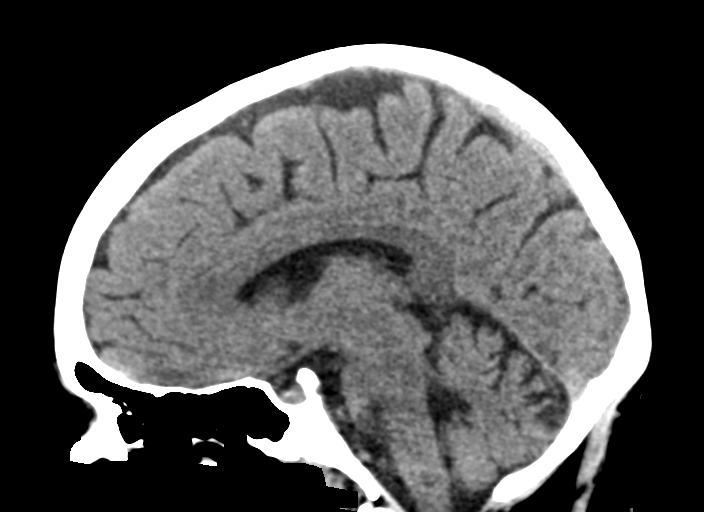
[im 39/58  brain]
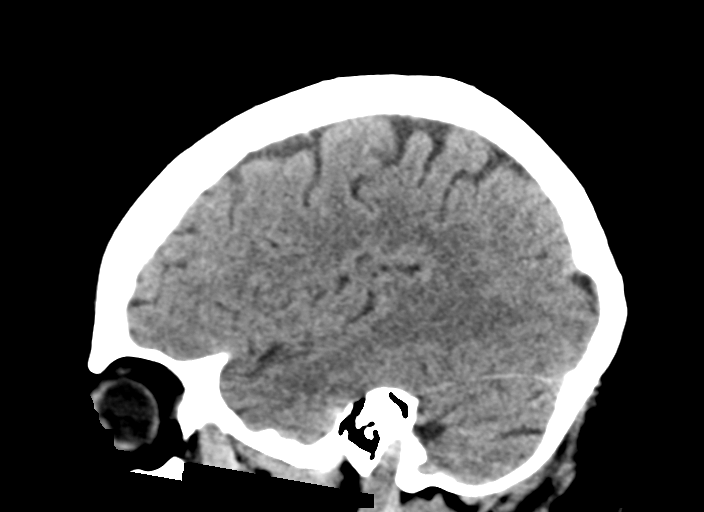

[15 of 47 positions shown; findings below may reference images not displayed]

FINDINGS: Brain: No evidence of acute infarction, hemorrhage, hydrocephalus,
extra-axial collection or mass lesion/mass effect.

Vascular: No hyperdense vessel or unexpected calcification.

Skull: Normal. Negative for fracture or focal lesion.

Sinuses/Orbits: No acute finding.

Other: None.
IMPRESSION: No acute intracranial abnormalities.

## 2018-04-13 ENCOUNTER — Ambulatory Visit
Admission: RE | Admit: 2018-04-13 | Discharge: 2018-04-13 | Disposition: A | Discharge status: Home / Self Care | Payer: Medicare Other | Source: Ambulatory Visit | Attending: Family Medicine | Admitting: Family Medicine

## 2018-04-13 DIAGNOSIS — Z1231 Encounter for screening mammogram for malignant neoplasm of breast: Secondary | ICD-10-CM

## 2018-04-18 ENCOUNTER — Other Ambulatory Visit: Payer: Self-pay | Admitting: Neurology

## 2018-04-19 NOTE — Progress Notes (Signed)
[facility]  PATIENT: Teresa Lawrence DOB: 11-Jul-1966   REASON FOR VISIT: Follow-up for seizure disorder  migraines HISTORY FROM: Patient and mom   HISTORY OF PRESENT ILLNESS:UPDATE 7/3/2019CM Teresa Lawrence, 71, year old female returns for follow-up with history of headaches and seizure disorder.  Last seizure occurred January  31st 2019.  She is currently on phenobarbital 64.8 mg twice daily.  She also takes clonazepam 2 mg tablet twice daily and 2 tablets at bedtime.  For her headache she is on Neurontin 100 twice daily.  She does not drive.  She also has a history of bipolar disorder but does not see psychiatry.  Last phenobarbital level 11/23/2017 was 22.  She returns for reevaluation   UPDATE 2/5/2019CM Teresa Lawrence, 52 year old female returns for follow-up with a history of headaches and seizure disorder.  Patient was seen in the emergency room on 11/18/2017 for seizure.  He has not was described by mother as a episode of generalized body convulsions lasting for approximately 3-5 minutes.  She denies any bowel or bladder incontinence but did say she bit  her tongue.  She denies missing any doses of her phenobarbital however phenobarbital level was 8.9 which is subtherapeutic.  She also has a history of migraines but only takes gabapentin  as needed and not as prescribed.  She says her headaches are in good control.  She has not seen a psychiatrist for her bipolar disorder as requested by Dr. Jannifer Franklin.  She returns for reevaluation. 08/04/17 Teresa Lawrence is a 52 year old right-handed white female with a history of bipolar disorder and a history of frequent headaches and a history of seizures. The patient last had a seizure in July 2017. She has been on phenobarbital for a number of years with excellent control. Her levels were somewhat low, suggesting she had missed several doses of her medication. The patient continues to complain of problems with severe mood swings and irritability.  She has not been seen through psychiatry. She also complains of headaches that are almost daily in nature, she has been on gabapentin in low dose taking 100 mg 3 times daily. She is tolerating the medication fairly well. She has had some slight swelling in the ankles, she also reports some intermittent problems with feeling dizzy and with a tendency to fall, she last fell within a week prior to this evaluation. The patient does not sleep well at night, she remains sleepy during the day. She returns for an evaluation.   REVIEW OF SYSTEMS: Full 14 system review of systems performed and notable only for those listed, all others are neg:  Constitutional: Fatigue Cardiovascular: neg Ear/Nose/Throat: neg  Skin: neg Eyes: neg Respiratory: neg Gastroitestinal: neg  Hematology/Lymphatic: neg  Endocrine: neg Musculoskeletal:neg Allergy/Immunology: neg Neurological: Seizure history of headaches  Psychiatric: Depression, bipolar disorder no assistive device Sleep : neg   ALLERGIES: Allergies  Allergen Reactions  . Requip [Ropinirole Hcl] Nausea And Vomiting  . Topamax [Topiramate] Nausea Only  . Zonegran [Zonisamide] Nausea Only    HOME MEDICATIONS: Outpatient Medications Prior to Visit  Medication Sig Dispense Refill  . acyclovir (ZOVIRAX) 200 MG capsule Take 200 mg by mouth 3 (three) times daily as needed (fever blisters).     Marland Kitchen atorvastatin (LIPITOR) 20 MG tablet Take 1 tablet (20 mg total) by mouth daily.    . BELSOMRA 20 MG TABS TAKE 1 TABLET BY MOUTH AT BEDTIME AS NEEDED 30 tablet 5  . Boric Acid POWD Place 1 capsule vaginally See  admin instructions. As directed for yeast infections    . clonazePAM (KLONOPIN) 2 MG tablet TAKE 1 TABLET BY MOUTH TWICE DAILY AND 2 TABLETS AT BEDTIME (Patient taking differently: Take 2 mg by mouth in the morning then 2 mg in the afternoon then 4 mg at bedtime) 120 tablet 3  . diphenhydrAMINE (BENADRYL) 25 MG tablet Take 25 mg by mouth every 6 (six) hours  as needed for allergies.    . furosemide (LASIX) 40 MG tablet Take 40 mg by mouth daily as needed for fluid or edema.     . gabapentin (NEURONTIN) 100 MG capsule TAKE 1 CAPSULE BY MOUTH THREE TIMES DAILY (Patient taking differently: Take 100 mg by mouth twice daily as needed for head pain) 90 capsule 3  . hydrocortisone cream 1 % Apply 1 application topically as needed (itching).    Marland Kitchen ibuprofen (ADVIL,MOTRIN) 600 MG tablet Take 1 tablet (600 mg total) by mouth every 6 (six) hours as needed (mild pain). 30 tablet 1  . loperamide (IMODIUM) 2 MG capsule Take 2 mg by mouth 4 (four) times daily as needed for diarrhea or loose stools.    . magic mouthwash SOLN 15 mLs by Mouth Rinse route 2 (two) times daily as needed for mouth pain.   3  . pantoprazole (PROTONIX) 40 MG tablet Take 40 mg by mouth 2 (two) times daily as needed (for reflux symptoms).     Marland Kitchen PHENobarbital (LUMINAL) 64.8 MG tablet TAKE 1 TABLET BY MOUTH TWICE DAILY 60 tablet 5  . pramipexole (MIRAPEX) 0.25 MG tablet TAKE 1 TABLET BY MOUTH TWICE DAILY DURING  THE  DAY,  AND  TWO  TABLETS  AT  NIGHT (Patient taking differently: Take 0.25-0.5 mg by mouth See admin instructions. 0.25 mg by mouth in the morning then 0.25 mg in the afternoon then 0.5 mg at bedtime) 120 tablet 5  . promethazine (PHENERGAN) 25 MG tablet Take 25 mg by mouth every 6 (six) hours as needed for nausea or vomiting.    Marland Kitchen QUEtiapine (SEROQUEL) 100 MG tablet TAKE ONE & ONE-HALF TABLETS BY MOUTH AT BEDTIME (Patient taking differently: Take 100 mg by mouth at bedtime. ) 45 tablet 11  . acetaminophen (TYLENOL) 500 MG tablet Take 500 mg by mouth every 6 (six) hours as needed (for headaches).    . DIPHENHYDRAMINE HCL, SLEEP, PO Take 1 tablet by mouth at bedtime as needed (for sleep).    Marland Kitchen HYDROcodone-acetaminophen (NORCO) 5-325 MG tablet Take 1-2 tablets by mouth daily as needed. (Patient not taking: Reported on 04/20/2018) 30 tablet 0   No facility-administered medications prior to  visit.     PAST MEDICAL HISTORY: Past Medical History:  Diagnosis Date  . Abnormal Pap smear   . Anxiety   . Bipolar disorder ([place])   . Dysmenorrhea   . Dysplasia   . Dysrhythmia    episodes of palpitations  . Dysuria   . H/O head injury   . Headache(784.0)   . High blood cholesterol   . HSV-2 infection   . Hx of hypercholesterolemia   . Hx of ovarian cyst   . Hx of seizure disorder   . Mass of ovary    left side  . Menorrhagia   . Mental retardation    mild per [facility] in [facility]  . Mild mental retardation   . Perimenopausal vasomotor symptoms   . PMS (premenstrual syndrome)   . PMS (premenstrual syndrome)   . PMS (premenstrual syndrome)   .  RLS (restless legs syndrome)   . Seizures ([place])   . Uterine mass   . Vaginal yeast infection 2007   recurrent   . Yeast vaginitis     PAST SURGICAL HISTORY: Past Surgical History:  Procedure Laterality Date  . ABDOMINAL HYSTERECTOMY  2013  . COLPOSCOPY    . CYSTOSCOPY Bilateral 05/30/2013   Procedure: CYSTOSCOPY;  Surgeon: Betsy Coder, MD;  Location: [facility];  Service: Gynecology;  Laterality: Bilateral;  . DILATION AND CURETTAGE OF UTERUS    . ENDOMETRIAL BIOPSY    . HYSTEROSCOPY W/ ENDOMETRIAL ABLATION    . LAPAROSCOPIC HYSTERECTOMY Right 05/30/2013   Procedure: HYSTERECTOMY TOTAL LAPAROSCOPIC;  Surgeon: Betsy Coder, MD;  Location: [facility];  Service: Gynecology;  Laterality: Right;  Right Salpingectomy  . POLYPECTOMY    . WISDOM TOOTH EXTRACTION      FAMILY HISTORY: Family History  Problem Relation Age of Onset  . Heart attack Father   . Breast cancer Sister     SOCIAL HISTORY: Social History   Socioeconomic History  . Marital status: Single    Spouse name: Not on file  . Number of children: 0  . Years of education: HS  . Highest education level: Not on file  Occupational History  . Occupation: Unemployed  Social Needs  . Financial resource strain: Not on file  . Food insecurity:    Worry: Not on  file    Inability: Not on file  . Transportation needs:    Medical: Not on file    Non-medical: Not on file  Tobacco Use  . Smoking status: Former Research scientist (life sciences)  . Smokeless tobacco: Never Used  . Tobacco comment: Quit 10 years ago.  Substance and Sexual Activity  . Alcohol use: No    Alcohol/week: 0.0 oz  . Drug use: No  . Sexual activity: Not on file  Lifestyle  . Physical activity:    Days per week: Not on file    Minutes per session: Not on file  . Stress: Not on file  Relationships  . Social connections:    Talks on phone: Not on file    Gets together: Not on file    Attends religious service: Not on file    Active member of club or organization: Not on file    Attends meetings of clubs or organizations: Not on file    Relationship status: Not on file  . Intimate partner violence:    Fear of current or ex partner: Not on file    Emotionally abused: Not on file    Physically abused: Not on file    Forced sexual activity: Not on file  Other Topics Concern  . Not on file  Social History Narrative   Patient lives at home alone with her one dog and four cats.   Disabled   Education high school.   Right handed.        PHYSICAL EXAM  Vitals:   04/20/18 0719  BP: 126/79  Pulse: 84  Weight: 241 lb 9.6 oz (109.6 kg)  Height: 5\' 2"  (1.575 m)   Body mass index is 44.19 kg/m.  Generalized: Well developed, obese female in no acute distress  Head: normocephalic and atraumatic,. Oropharynx benign  Neck: Supple,  Musculoskeletal: No deformity   Neurological examination   Mentation: Alert oriented to time, place, history taking. Attention span and concentration appropriate.  Follows all commands speech and language fluent.   Cranial nerve II-XII: Pupils were equal round reactive to  light extraocular movements were full, visual field were full on confrontational test. Facial sensation and strength were normal. hearing was intact to finger rubbing bilaterally. Uvula tongue  midline. head turning and shoulder shrug were normal and symmetric.Tongue protrusion into cheek strength was normal. Motor: normal bulk and tone, full strength in the BUE, BLE,  Sensory: normal and symmetric to light touch, pinprick, and  Vibration, in the upper and lower extremities Coordination: finger-nose-finger, heel-to-shin bilaterally, no dysmetria Reflexes: Symmetric upper and lower plantar responses were flexor bilaterally. Gait and Station: Rising up from seated position without assistance,  normal stance,   Tandem gait is unsteady.  No assistive device DIAGNOSTIC DATA (LABS, IMAGING, TESTING) - I reviewed patient records, labs, notes, testing and imaging myself where available.  Lab Results  Component Value Date   WBC 8.3 11/18/2017   HGB 11.4 (L) 11/18/2017   HCT 35.9 (L) 11/18/2017   MCV 91.3 11/18/2017   PLT 273 11/18/2017      Component Value Date/Time   NA 139 11/18/2017 2235   NA 145 (H) 08/04/2017 1427   K 3.5 11/18/2017 2235   CL 101 11/18/2017 2235   CO2 28 11/18/2017 2235   GLUCOSE 87 11/18/2017 2235   BUN 13 11/18/2017 2235   BUN 9 08/04/2017 1427   CREATININE 0.67 11/18/2017 2235   CALCIUM 8.7 (L) 11/18/2017 2235   PROT 7.2 10/12/2017 1822   PROT 8.0 08/04/2017 1427   ALBUMIN 3.5 10/12/2017 1822   ALBUMIN 4.4 08/04/2017 1427   AST 27 10/12/2017 1822   ALT 33 10/12/2017 1822   ALKPHOS 101 10/12/2017 1822   BILITOT 0.6 10/12/2017 1822   BILITOT 0.2 08/04/2017 1427   GFRNONAA >60 11/18/2017 2235   GFRAA >60 11/18/2017 2235    ASSESSMENT AND PLAN  52 y.o. year old female  has a past medical history of  Anxiety, Bipolar disorder ([place]),  Headache(784.0),  seizure disorder, here to follow-up  She reports that her headaches are improved.  She has not seen psychiatry for her bipolar disorder.  Last seizure occurred November 18, 2017.  She does not drive a car   Continue phenobarbital at current dose  Last level we will check level 22 on 11/23/17 will  refill Continue gabapentin 100 mg  Twice daily 12 hours apart for headache will refill Continue clonazepam at current dose RX to pt per request Follow-up in 6 months pt may aware she needs to be seen for continued [place], [facility], [facility], [facility] [address] [place], [place] [address] [phone]

## 2018-04-20 ENCOUNTER — Encounter (HOSPITAL_COMMUNITY): Payer: Self-pay | Admitting: Emergency Medicine

## 2018-04-20 ENCOUNTER — Ambulatory Visit (HOSPITAL_COMMUNITY)
Admission: EM | Admit: 2018-04-20 | Discharge: 2018-04-20 | Disposition: A | Discharge status: Home / Self Care | Payer: Medicare Other | Attending: Family Medicine | Admitting: Family Medicine

## 2018-04-20 ENCOUNTER — Ambulatory Visit (INDEPENDENT_AMBULATORY_CARE_PROVIDER_SITE_OTHER): Payer: Medicare Other | Admitting: Nurse Practitioner

## 2018-04-20 ENCOUNTER — Encounter: Payer: Self-pay | Admitting: Nurse Practitioner

## 2018-04-20 VITALS — BP 126/79 | HR 84 | Ht 62.0 in | Wt 241.6 lb

## 2018-04-20 DIAGNOSIS — R519 Headache, unspecified: Secondary | ICD-10-CM

## 2018-04-20 DIAGNOSIS — G40909 Epilepsy, unspecified, not intractable, without status epilepticus: Secondary | ICD-10-CM | POA: Diagnosis not present

## 2018-04-20 DIAGNOSIS — R51 Headache: Secondary | ICD-10-CM | POA: Diagnosis not present

## 2018-04-20 DIAGNOSIS — T148XXA Other injury of unspecified body region, initial encounter: Secondary | ICD-10-CM

## 2018-04-20 MED ORDER — PHENOBARBITAL 64.8 MG PO TABS: 64.8000 mg | ORAL_TABLET | Freq: Two times a day (BID) | ORAL | 5 refills | Status: DC

## 2018-04-20 MED ORDER — MUPIROCIN CALCIUM 2 % EX CREA: 1.0000 "application " | TOPICAL_CREAM | Freq: Two times a day (BID) | CUTANEOUS | 0 refills | Status: DC

## 2018-04-20 MED ORDER — CLONAZEPAM 2 MG PO TABS: ORAL_TABLET | ORAL | 5 refills | Status: DC

## 2018-04-20 MED ORDER — GABAPENTIN 100 MG PO CAPS: ORAL_CAPSULE | ORAL | 1 refills | Status: DC

## 2018-04-20 NOTE — ED Provider Notes (Signed)
[facility]    CSN: [id number] Arrival date & time: 04/20/18  1115     History   Chief Complaint Chief Complaint  Patient presents with  . Abscess    HPI Teresa Lawrence is a 52 y.o. female presenting for a wound on the chest. She noticed this 4 days ago after coming in to the house after mowing the lawn, it was a small red area that itched and burned when she took a shower. She felt like it was a bite but didn't find any tick. The area has grown without healing and burns when she sweats. Applied antibiotic ointment and feels like that has helped. No medications taken. No history of immunosuppression/steroids, etc.   HPI  Past Medical History:  Diagnosis Date  . Abnormal Pap smear   . Anxiety   . Bipolar disorder ([place])   . Dysmenorrhea   . Dysplasia   . Dysrhythmia    episodes of palpitations  . Dysuria   . H/O head injury   . Headache(784.0)   . High blood cholesterol   . HSV-2 infection   . Hx of hypercholesterolemia   . Hx of ovarian cyst   . Hx of seizure disorder   . Mass of ovary    left side  . Menorrhagia   . Mental retardation    mild per [facility] in [facility]  . Mild mental retardation   . Perimenopausal vasomotor symptoms   . PMS (premenstrual syndrome)   . PMS (premenstrual syndrome)   . PMS (premenstrual syndrome)   . RLS (restless legs syndrome)   . Seizures ([place])   . Uterine mass   . Vaginal yeast infection 2007   recurrent   . Yeast vaginitis     Patient Active Problem List   Diagnosis Date Noted  . Nondisplaced fracture of lateral malleolus of left fibula, initial encounter for closed fracture 10/25/2017  . Common migraine with intractable migraine 05/14/2016  . Insomnia 03/17/2013  . Headache 02/23/2013  . Hx of yeast infection 04/20/2012  . Hx of menorrhagia 04/20/2012  . 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 Bipolar disorder ([place]) 04/20/2012  . Hx of menorrhagia 04/20/2012  . Hx of ovarian cyst 04/20/2012  .  Epilepsy ([place]) 04/20/2012  . Hx of seizure disorder 04/20/2012  . 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 Hx of dysmenorrhea 04/20/2012  . 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 Hx of hypercholesterolemia 04/20/2012  . Hx of PMS (premenstrual syndrome) 04/20/2012  . Encounter for therapeutic drug monitoring 05/01/2011    Past Surgical History:  Procedure Laterality Date  . ABDOMINAL HYSTERECTOMY  2013  . COLPOSCOPY    . CYSTOSCOPY Bilateral 05/30/2013   Procedure: CYSTOSCOPY;  Surgeon: Betsy Coder, MD;  Location: [facility];  Service: Gynecology;  Laterality: Bilateral;  . DILATION AND CURETTAGE OF UTERUS    . ENDOMETRIAL BIOPSY    . HYSTEROSCOPY W/ ENDOMETRIAL ABLATION    . LAPAROSCOPIC HYSTERECTOMY Right 05/30/2013   Procedure: HYSTERECTOMY TOTAL LAPAROSCOPIC;  Surgeon: Betsy Coder, MD;  Location: [facility];  Service: Gynecology;  Laterality: Right;  Right Salpingectomy  . POLYPECTOMY    . WISDOM TOOTH EXTRACTION      OB History   None      Home Medications    Prior to Admission medications   Medication Sig Start Date End Date Taking? Authorizing Provider  acyclovir (ZOVIRAX) 200 MG capsule Take 200 mg by mouth 3 (three) times daily as needed (  fever blisters).     [provider]  atorvastatin (LIPITOR) 20 MG tablet Take 1 tablet (20 mg total) by mouth daily. 08/04/17   [name], MD  BELSOMRA 20 MG TABS TAKE 1 TABLET BY MOUTH AT BEDTIME AS NEEDED 01/24/18   [name], MD  Boric Acid POWD Place 1 capsule vaginally See admin instructions. As directed for yeast infections    [provider]  clonazePAM (KLONOPIN) 2 MG tablet Take 2 mg by mouth in the morning then 2 mg in the afternoon then 4 mg at bedtime 04/20/18   Dennie Bible, NP  diphenhydrAMINE (BENADRYL) 25 MG tablet Take 25 mg by mouth every 6 (six) hours as needed for allergies.    [provider]  furosemide (LASIX) 40 MG tablet Take 40 mg by mouth daily as needed for fluid or  edema.  07/24/13   [provider]  gabapentin (NEURONTIN) 100 MG capsule Take 100 mg by mouth twice daily as needed for head pain 04/20/18   Dennie Bible, NP  hydrocortisone cream 1 % Apply 1 application topically as needed (itching).    [provider]  ibuprofen (ADVIL,MOTRIN) 600 MG tablet Take 1 tablet (600 mg total) by mouth every 6 (six) hours as needed (mild pain). 05/31/13   Earnstine Regal, PA-C  loperamide (IMODIUM) 2 MG capsule Take 2 mg by mouth 4 (four) times daily as needed for diarrhea or loose stools.    [provider]  magic mouthwash SOLN 15 mLs by Mouth Rinse route 2 (two) times daily as needed for mouth pain.  11/08/17   [provider]  mupirocin cream (BACTROBAN) 2 % Apply 1 application topically 2 (two) times daily. 04/20/18   Patrecia Pour, MD  pantoprazole (PROTONIX) 40 MG tablet Take 40 mg by mouth 2 (two) times daily as needed (for reflux symptoms).     [provider]  PHENobarbital (LUMINAL) 64.8 MG tablet Take 1 tablet (64.8 mg total) by mouth 2 (two) times daily. 04/20/18   Dennie Bible, NP  pramipexole (MIRAPEX) 0.25 MG tablet TAKE 1 TABLET BY MOUTH TWICE DAILY DURING  THE  DAY,  AND  TWO  TABLETS  AT  NIGHT Patient taking differently: Take 0.25-0.5 mg by mouth See admin instructions. 0.25 mg by mouth in the morning then 0.25 mg in the afternoon then 0.5 mg at bedtime 11/16/17   [name], MD  promethazine (PHENERGAN) 25 MG tablet Take 25 mg by mouth every 6 (six) hours as needed for nausea or vomiting.    [provider]  QUEtiapine (SEROQUEL) 100 MG tablet TAKE ONE & ONE-HALF TABLETS BY MOUTH AT BEDTIME Patient taking differently: Take 100 mg by mouth at bedtime.  08/04/17   [name], MD    Family History Family History  Problem Relation Age of Onset  . Heart attack Father   . Breast cancer Sister     Social History Social History   Tobacco Use  . Smoking status: Former Research scientist (life sciences)    . Smokeless tobacco: Never Used  . Tobacco comment: Quit 10 years ago.  Substance Use Topics  . Alcohol use: No    Alcohol/week: 0.0 oz  . Drug use: No     Allergies   Requip [ropinirole hcl]; Topamax [topiramate]; and Zonegran [zonisamide]   Review of Systems Review of Systems No fevers.   Physical Exam Triage Vital Signs ED Triage Vitals [04/20/18 1206]  Enc Vitals Group  BP 130/62     Pulse Rate 85     Resp 16     Temp 98.3 F (36.8 C)     Temp Source Oral     SpO2 98 %     Weight      Height      Head Circumference      Peak Flow      Pain Score      Pain Loc      Pain Edu?      Excl. in [place]?    No data found.  Updated Vital Signs BP 130/62 (BP Location: Right Arm)   Pulse 85   Temp 98.3 F (36.8 C) (Oral)   Resp 16   LMP 05/11/2013   SpO2 98%   Visual Acuity Right Eye Distance:   Left Eye Distance:   Bilateral Distance:    Right Eye Near:   Left Eye Near:    Bilateral Near:     Physical Exam  Gen: Well-appearing 52 y.o.female in NAD HEENT: MMM, posterior oropharynx clear Pulm: Non-labored; CTAB, no wheezes  CV: Regular rate, no murmur appreciated; distal pulses intact/symmetric GI: + BS; soft, non-tender, non-distended Skin: ~1.5in annular shallow abrasion with red wound bed and scant slough. No significant surrounding erythema, no palpable fluctuance or induration. No purulence or odor. Neuro: A&Ox3, CN II-XII without deficits     UC Treatments / Results  Labs (all labs ordered are listed, but only abnormal results are displayed) Labs Reviewed - No data to display  EKG None  Radiology No results found.  Procedures Procedures (including critical care time)  Medications Ordered in UC Medications - No data to display  Initial Impression / Assessment and Plan / UC Course  I have reviewed the triage vital signs and the nursing notes.  Pertinent labs & imaging results that were available during my care of the patient were  reviewed by me and considered in my medical decision making (see chart for details).    52yo immunocompetent female with initial bite from unknown insect, etc. now complicated by friction abrasion due to location between breasts. No infection.  - Local wound care, apply antibiotic ointment, keep covered, wash with soap and water. Return if not improving over next few days or if fever.  Final Clinical Impressions(s) / UC Diagnoses   Final diagnoses:  Abrasion     Discharge Instructions     apply antibiotic ointment, keep covered, wash with soap and water. Return if not improving over next few days or if fever develops.    ED Prescriptions    Medication Sig Dispense Auth. Provider   mupirocin cream (BACTROBAN) 2 % Apply 1 application topically 2 (two) times daily. 15 g Patrecia Pour, MD     Controlled Substance Prescriptions [place] Controlled Substance Registry consulted? Not Applicable   Patrecia Pour, MD 04/20/18 1340

## 2018-04-20 NOTE — ED Notes (Signed)
Patient has a moist, raw area to left breast/chest.  Noticed an itchy area initially over the weekend.

## 2018-04-20 NOTE — Discharge Instructions (Addendum)
apply antibiotic ointment, keep covered, wash with soap and water. Return if not improving over next few days or if fever develops.

## 2018-04-20 NOTE — ED Triage Notes (Signed)
Pt here for abscess to breast area from possible insect bite

## 2018-04-20 NOTE — Progress Notes (Signed)
I have read the note, and I agree with the clinical assessment and plan.  Teresa Lawrence   

## 2018-04-20 NOTE — Patient Instructions (Signed)
Continue phenobarbital at current dose  Last level we will check level 22 on 11/23/17 Continue gabapentin 100 mg  Twice daily 12 hours apart for headache  Continue clonazepam at current dose RX to pt per request Follow-up in 6 months

## 2018-05-23 ENCOUNTER — Other Ambulatory Visit: Payer: Self-pay | Admitting: Neurology

## 2018-06-10 ENCOUNTER — Telehealth: Payer: Self-pay | Admitting: Nurse Practitioner

## 2018-06-10 NOTE — Telephone Encounter (Signed)
I called her home  (not pts #) and cell # [phone]#.  Will have to wait for pt to return call.

## 2018-06-10 NOTE — Telephone Encounter (Signed)
Pt would like to know if she can begin a prenatal vitamin(pt not pregnant, just wants to begin a pre natal supplement for hair growth)please call

## 2018-06-14 NOTE — Telephone Encounter (Signed)
Pt called back today. She forgot she had not updated her phone numbers. I have her numbers updated. Please call to advise

## 2018-06-14 NOTE — Telephone Encounter (Signed)
I relayed to pt that it was ok to take prenatal vitamin w/o iron with her other meds that we prescribe.  (SZ and RLS meds). She is trying to grow her hair.  I would CM /NP know so she is aware.

## 2018-06-14 NOTE — Telephone Encounter (Signed)
noted 

## 2018-06-28 ENCOUNTER — Telehealth: Payer: Self-pay | Admitting: Nurse Practitioner

## 2018-06-28 DIAGNOSIS — F3132 Bipolar disorder, current episode depressed, moderate: Secondary | ICD-10-CM

## 2018-06-28 NOTE — Telephone Encounter (Signed)
I called the patient.  The patient wishes for her bipolar disorder be managed through this office.  I indicated that we are not trained to do this, I have referred her to a psychiatrist previously, the patient indicates that she does not believe that the psychiatry doctors help her.  She is willing to have me make another referral.  I will try to get this set up.

## 2018-06-28 NOTE — Telephone Encounter (Signed)
Discussed with NP and called patient. Advised patient that the NP will not prescribe Rexulti, a psychiatric medication. Advised she can ask her PCP or psychiatrist, if she is seeing one at this time. She stated she isn't seeing a psychiatrist.   This RN advised her that Dr Jannifer Franklin has referred her to a psychiatrist a number of times. She stated she has seen psychiatrists in the past and "doesn't like them".   She stated when she first began seeing Dr Jannifer Franklin he was treating her for bipolar, and he prescribed her "all these medicines". She stated she got better for a while but isn't doing well now.  This RN restated that she can discuss the medication with her PCP. She repeated herself and stated that she didn't understand why Dr Jannifer Franklin wouldn't prescribe the medicine. This RN advised again that this is a neurologist office, not psychiatric or behavioral health. The patient continued to repeat herself. This RN advised that she would send this note to Dr Jannifer Franklin for his review but otherwise, this RN cannot speak for Dr Jannifer Franklin. Patient verbalized understanding, appreciation.

## 2018-06-28 NOTE — Telephone Encounter (Signed)
Pt requesting a call to discuss starting medication rexulti. Did not wish to discuss further with me, please advise

## 2018-07-04 NOTE — Telephone Encounter (Addendum)
Pt called wanting to inform Dr. Jannifer Franklin that she has scheduled an appt for 10/25 with the psychiatrist

## 2018-07-04 NOTE — Telephone Encounter (Signed)
Good, I have tried to reinforce to the patient that she needs to be followed through psychiatry, she clearly needs a specialist to manage her bipolar disorder.

## 2018-07-27 ENCOUNTER — Other Ambulatory Visit: Payer: Self-pay | Admitting: Neurology

## 2018-08-22 ENCOUNTER — Telehealth: Payer: Self-pay | Admitting: Nurse Practitioner

## 2018-08-22 ENCOUNTER — Other Ambulatory Visit: Payer: Self-pay | Admitting: Neurology

## 2018-08-22 NOTE — Telephone Encounter (Signed)
Pt requesting a call stating she is unclear if Hoyle Sauer would like her to continue care here along with Dr. Casimiro Needle

## 2018-08-22 NOTE — Telephone Encounter (Signed)
Called patient and advised her that this office manages her epilepsy and headaches. She must continue to be seen for her medications to be refilled. Then she asked when her follow up is scheduled. She got paper, pencil, and this RN gave her date/time. She verbalized understanding, appreciation.

## 2018-08-22 NOTE — Telephone Encounter (Signed)
I spoke with both [place] and mother Vaughan Basta. Per last ov note, Hoyle Sauer preferred pt. see psych for mx. of bipolar dz., as we do not manage this in our office. Pt. is currently on Zoloft. Per pt. and mother, pt. has seen Dr. Mickie Kay (psych) and he has placed her on Vraylar. I have added this to pt's med list and explained they should call Dr. Phylliss Bob office and make sure he is aware pt. is taking Zoloft; make sure he wants her to continue this, and if so, it would be most appropriate for him to take over this rx.  Mother verbalized understanding of same; sts. she will call Dr. Rosetta Posner

## 2018-09-20 ENCOUNTER — Encounter (INDEPENDENT_AMBULATORY_CARE_PROVIDER_SITE_OTHER): Payer: Self-pay | Admitting: Orthopaedic Surgery

## 2018-09-20 ENCOUNTER — Ambulatory Visit (INDEPENDENT_AMBULATORY_CARE_PROVIDER_SITE_OTHER): Payer: Medicare Other

## 2018-09-20 ENCOUNTER — Ambulatory Visit (INDEPENDENT_AMBULATORY_CARE_PROVIDER_SITE_OTHER): Payer: Medicaid Other | Admitting: Orthopaedic Surgery

## 2018-09-20 VITALS — BP 149/92 | HR 97

## 2018-09-20 DIAGNOSIS — M25572 Pain in left ankle and joints of left foot: Secondary | ICD-10-CM

## 2018-09-20 NOTE — Progress Notes (Signed)
Office Visit Note   Patient: Teresa Lawrence           Date of Birth: 1966-06-08           MRN: [id number] Visit Date: 09/20/2018              Requested by: Tamsen Roers, [place], [place] [address] PCP: Tamsen Roers, MD   Assessment & Plan: Visit Diagnoses:  1. Pain in left ankle and joints of left foot     Plan: Impression is 52 year old female with posttraumatic arthritis exacerbation.  We provided her with an ASO brace today to help give her some support.  Over-the-counter NSAIDs as needed.  We did get her scheduled for an ultrasound-guided injection with Dr. Junius Roads on Friday.  Questions encouraged and answered.  Follow-up as needed.  Follow-Up Instructions: Return if symptoms worsen or fail to improve.   Orders:  Orders Placed This Encounter  Procedures  . XR Ankle Complete Left   No orders of the defined types were placed in this encounter.     Procedures: No procedures performed   Clinical Data: No additional findings.   Subjective: Chief Complaint  Patient presents with  . Left Ankle - Pain, Follow-up    Teresa Lawrence is a 52 year old female comes in with left ankle pain with recent worsening.  She was treated for a nondisplaced distal fibula fracture nonsurgically that went on to uneventful healing.  She is endorsing pain throughout the ankle that is worse with weightbearing and range of motion.  She does endorse some swelling.  Denies any constitutional symptoms.   Review of Systems  Constitutional: Negative.   HENT: Negative.   Eyes: Negative.   Respiratory: Negative.   Cardiovascular: Negative.   Endocrine: Negative.   Musculoskeletal: Negative.   Neurological: Negative.   Hematological: Negative.   Psychiatric/Behavioral: Negative.   All other systems reviewed and are negative.    Objective: Vital Signs: BP (!) 149/92   Pulse 97   LMP 05/11/2013   Physical Exam  Constitutional: She is oriented to person, place, and time. She  appears well-developed and well-nourished.  Pulmonary/Chest: Effort normal.  Neurological: She is alert and oriented to person, place, and time.  Skin: Skin is warm. Capillary refill takes less than 2 seconds.  Psychiatric: She has a normal mood and affect. Her behavior is normal. Judgment and thought content normal.  Nursing note and vitals reviewed.   Ortho Exam Left ankle exam shows no cellulitis or evidence of infection.  She has moderate discomfort with passive ankle range of motion.  Subtalar joint elicits minimal pain. Specialty Comments:  No specialty comments available.  Imaging: Xr Ankle Complete Left  Result Date: 09/20/2018 Mild to moderate osteoarthritis with periarticular spurring    PMFS History: Patient Active Problem List   Diagnosis Date Noted  . Nondisplaced fracture of lateral malleolus of left fibula, initial encounter for closed fracture 10/25/2017  . Common migraine with intractable migraine 05/14/2016  . Insomnia 03/17/2013  . Headache 02/23/2013  . Hx of yeast infection 04/20/2012  . Hx of menorrhagia 04/20/2012  . 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 Bipolar disorder ([place]) 04/20/2012  . Hx of menorrhagia 04/20/2012  . Hx of ovarian cyst 04/20/2012  . Epilepsy ([place]) 04/20/2012  . Hx of seizure disorder 04/20/2012  . 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 Hx of dysmenorrhea 04/20/2012  . 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 Hx of hypercholesterolemia 04/20/2012  . Hx  of PMS (premenstrual syndrome) 04/20/2012  . Encounter for therapeutic drug monitoring 05/01/2011   Past Medical History:  Diagnosis Date  . Abnormal Pap smear   . Anxiety   . Bipolar disorder ([place])   . Dysmenorrhea   . Dysplasia   . Dysrhythmia    episodes of palpitations  . Dysuria   . H/O head injury   . Headache(784.0)   . High blood cholesterol   . HSV-2 infection   . Hx of hypercholesterolemia   . Hx of ovarian cyst   . Hx of seizure disorder   . Mass  of ovary    left side  . Menorrhagia   . Mental retardation    mild per [facility] in [facility]  . Mild mental retardation   . Perimenopausal vasomotor symptoms   . PMS (premenstrual syndrome)   . PMS (premenstrual syndrome)   . PMS (premenstrual syndrome)   . RLS (restless legs syndrome)   . Seizures ([place])   . Uterine mass   . Vaginal yeast infection 2007   recurrent   . Yeast vaginitis     Family History  Problem Relation Age of Onset  . Heart attack Father   . Breast cancer Sister     Past Surgical History:  Procedure Laterality Date  . ABDOMINAL HYSTERECTOMY  2013  . COLPOSCOPY    . CYSTOSCOPY Bilateral 05/30/2013   Procedure: CYSTOSCOPY;  Surgeon: Betsy Coder, MD;  Location: [facility];  Service: Gynecology;  Laterality: Bilateral;  . DILATION AND CURETTAGE OF UTERUS    . ENDOMETRIAL BIOPSY    . HYSTEROSCOPY W/ ENDOMETRIAL ABLATION    . LAPAROSCOPIC HYSTERECTOMY Right 05/30/2013   Procedure: HYSTERECTOMY TOTAL LAPAROSCOPIC;  Surgeon: Betsy Coder, MD;  Location: [facility];  Service: Gynecology;  Laterality: Right;  Right Salpingectomy  . POLYPECTOMY    . WISDOM TOOTH EXTRACTION     Social History   Occupational History  . Occupation: Unemployed  Tobacco Use  . Smoking status: Former Research scientist (life sciences)  . Smokeless tobacco: Never Used  . Tobacco comment: Quit 10 years ago.  Substance and Sexual Activity  . Alcohol use: No    Alcohol/week: 0.0 standard drinks  . Drug use: No  . Sexual activity: Not on file

## 2018-09-23 ENCOUNTER — Encounter (INDEPENDENT_AMBULATORY_CARE_PROVIDER_SITE_OTHER): Payer: Self-pay | Admitting: Family Medicine

## 2018-09-23 ENCOUNTER — Ambulatory Visit (INDEPENDENT_AMBULATORY_CARE_PROVIDER_SITE_OTHER): Payer: Medicare Other | Admitting: Family Medicine

## 2018-09-23 DIAGNOSIS — M25572 Pain in left ankle and joints of left foot: Secondary | ICD-10-CM | POA: Diagnosis not present

## 2018-09-23 MED ORDER — METHYLPREDNISOLONE ACETATE 40 MG/ML IJ SUSP
40.0000 mg | INTRAMUSCULAR | Status: AC | PRN
  Administered 2018-09-23: 40 mg via INTRA_ARTICULAR

## 2018-09-23 MED ORDER — LIDOCAINE HCL 1 % IJ SOLN
3.0000 mL | INTRAMUSCULAR | Status: AC | PRN
  Administered 2018-09-23: 3 mL

## 2018-09-23 NOTE — Progress Notes (Signed)
Office Visit Note   Patient: Teresa Lawrence           Date of Birth: 1966-08-23           MRN: [id number] Visit Date: 09/23/2018 Requested by: Tamsen Roers, [place], [place] [address] PCP: Tamsen Roers, MD  Subjective: Chief Complaint  Patient presents with  . Left Ankle - Pain    US - guided injection  - Dr. Erlinda Hong    HPI: He is here for a planned left ankle injection.  Continued pain after healing of lateral malleolus fracture.              ROS: Otherwise noncontributory  Objective: Vital Signs: LMP 05/11/2013   Physical Exam:  Left ankle: Diffuse tenderness around her ankle, particularly anterior lateral ankle joint line.  Imaging: None today.  Assessment & Plan: 1.  Chronic left ankle pain status post lateral malleolus fracture -Ultrasound-guided intra-articular injection today.  Follow-up as needed.   Follow-Up Instructions: No follow-ups on file.      Procedures: Medium Joint Inj on 09/23/2018 2:04 PM Indications: pain Details: 27 G 1.5 in needle, ultrasound-guided anterolateral approach Medications: 3 mL lidocaine 1 %; 40 mg methylPREDNISolone acetate 40 MG/ML  Ultrasound used to guide needle placement intra-articular into the anterolateral ankle joint.  Injectate was seen filling the joint capsule. Consent was given by the patient.      No notes on file    PMFS History: Patient Active Problem List   Diagnosis Date Noted  . Nondisplaced fracture of lateral malleolus of left fibula, initial encounter for closed fracture 10/25/2017  . Common migraine with intractable migraine 05/14/2016  . Insomnia 03/17/2013  . Headache 02/23/2013  . Hx of yeast infection 04/20/2012  . Hx of menorrhagia 04/20/2012  . 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 Bipolar disorder ([place]) 04/20/2012  . Hx of menorrhagia 04/20/2012  . Hx of ovarian cyst 04/20/2012  . Epilepsy ([place]) 04/20/2012  . Hx of seizure disorder 04/20/2012  . Hx of  Dysplasia 04/20/2012  . Hx of dysmenorrhea 04/20/2012  . 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 Hx of hypercholesterolemia 04/20/2012  . Hx of PMS (premenstrual syndrome) 04/20/2012  . Encounter for therapeutic drug monitoring 05/01/2011   Past Medical History:  Diagnosis Date  . Abnormal Pap smear   . Anxiety   . Bipolar disorder ([place])   . Dysmenorrhea   . Dysplasia   . Dysrhythmia    episodes of palpitations  . Dysuria   . H/O head injury   . Headache(784.0)   . High blood cholesterol   . HSV-2 infection   . Hx of hypercholesterolemia   . Hx of ovarian cyst   . Hx of seizure disorder   . Mass of ovary    left side  . Menorrhagia   . Mental retardation    mild [facility]  . Mild mental retardation   . Perimenopausal vasomotor symptoms   . PMS (premenstrual syndrome)   . PMS (premenstrual syndrome)   . PMS (premenstrual syndrome)   . RLS (restless legs syndrome)   . Seizures ([place])   . Uterine mass   . Vaginal yeast infection 2007   recurrent   . Yeast vaginitis     Family History  Problem Relation Age of Onset  . Heart attack Father   . Breast cancer Sister     Past Surgical History:  Procedure Laterality Date  .  ABDOMINAL HYSTERECTOMY  2013  . COLPOSCOPY    . CYSTOSCOPY Bilateral 05/30/2013   Procedure: CYSTOSCOPY;  Surgeon: Betsy Coder, MD;  Location: [facility];  Service: Gynecology;  Laterality: Bilateral;  . DILATION AND CURETTAGE OF UTERUS    . ENDOMETRIAL BIOPSY    . HYSTEROSCOPY W/ ENDOMETRIAL ABLATION    . LAPAROSCOPIC HYSTERECTOMY Right 05/30/2013   Procedure: HYSTERECTOMY TOTAL LAPAROSCOPIC;  Surgeon: Betsy Coder, MD;  Location: [facility];  Service: Gynecology;  Laterality: Right;  Right Salpingectomy  . POLYPECTOMY    . WISDOM TOOTH EXTRACTION     Social History   Occupational History  . Occupation: Unemployed  Tobacco Use  . Smoking status: Former Research scientist (life sciences)  . Smokeless tobacco: Never Used  . Tobacco comment: Quit 10 years ago.    Substance and Sexual Activity  . Alcohol use: No    Alcohol/week: 0.0 standard drinks  . Drug use: No  . Sexual activity: Not on file

## 2018-10-03 ENCOUNTER — Other Ambulatory Visit: Payer: Self-pay | Admitting: Neurology

## 2018-11-03 NOTE — Progress Notes (Signed)
[facility]  PATIENT: Teresa Lawrence DOB: [birth year]   REASON FOR VISIT: Follow-up for seizure disorder  migraines HISTORY FROM: Patient and mom   HISTORY OF PRESENT ILLNESS:UPDATE 1/17/2020CM Teresa Lawrence, 53 year old female returns for follow-up with history of headaches and seizure disorder.  Her last seizure occurred November 18, 2017.  She remains on phenobarbital twice daily.  She is also on clonazepam 2 mg twice daily and 2 tablets at bedtime.  She takes Neurontin for her headaches which are stable she does not drive a motor vehicle.  She has a history of bipolar disorder and sees Dr. Casimiro Needle who has recently had some changes in her medications however she cannot tell me what they are.  She returns for reevaluation   UPDATE 7/3/2019CM Teresa Lawrence, 62, year old female returns for follow-up with history of headaches and seizure disorder.  Last seizure occurred January  31st 2019.  She is currently on phenobarbital 64.8 mg twice daily.  She also takes clonazepam 2 mg tablet twice daily and 2 tablets at bedtime.  For her headache she is on Neurontin 100 twice daily.  She does not drive.  She also has a history of bipolar disorder but does not see psychiatry.  Last phenobarbital level 11/23/2017 was 22.  She returns for reevaluation   UPDATE 2/5/2019CM Teresa Lawrence, 53 year old female returns for follow-up with a history of headaches and seizure disorder.  Patient was seen in the emergency room on 11/18/2017 for seizure.  He has not was described by mother as a episode of generalized body convulsions lasting for approximately 3-5 minutes.  She denies any bowel or bladder incontinence but did say she bit  her tongue.  She denies missing any doses of her phenobarbital however phenobarbital level was 8.9 which is subtherapeutic.  She also has a history of migraines but only takes gabapentin  as needed and not as prescribed.  She says her headaches are in good control.  She has not seen a  psychiatrist for her bipolar disorder as requested by Dr. Jannifer Franklin.  She returns for reevaluation. 08/04/17 KWMs. Teresa Lawrence is a 53 year old right-handed white female with a history of bipolar disorder and a history of frequent headaches and a history of seizures. The patient last had a seizure in July 2017. She has been on phenobarbital for a number of years with excellent control. Her levels were somewhat low, suggesting she had missed several doses of her medication. The patient continues to complain of problems with severe mood swings and irritability. She has not been seen through psychiatry. She also complains of headaches that are almost daily in nature, she has been on gabapentin in low dose taking 100 mg 3 times daily. She is tolerating the medication fairly well. She has had some slight swelling in the ankles, she also reports some intermittent problems with feeling dizzy and with a tendency to fall, she last fell within a week prior to this evaluation. The patient does not sleep well at night, she remains sleepy during the day. She returns for an evaluation.   REVIEW OF SYSTEMS: Full 14 system review of systems performed and notable only for those listed, all others are neg:  Constitutional: Fatigue Cardiovascular: neg Ear/Nose/Throat: neg  Skin: neg Eyes: neg Respiratory: neg Gastroitestinal: neg  Hematology/Lymphatic: neg  Endocrine: neg Musculoskeletal:neg Allergy/Immunology: neg Neurological: Seizure history of headaches  Psychiatric: Depression, bipolar disorder  Sleep : neg   ALLERGIES: Allergies  Allergen Reactions  . Requip [Ropinirole Hcl] Nausea And  Vomiting  . Topamax [Topiramate] Nausea Only  . Zonegran [Zonisamide] Nausea Only    HOME MEDICATIONS: Outpatient Medications Prior to Visit  Medication Sig Dispense Refill  . acyclovir (ZOVIRAX) 200 MG capsule Take 200 mg by mouth 3 (three) times daily as needed (fever blisters).     Marland Kitchen atorvastatin (LIPITOR) 20 MG tablet  Take 1 tablet (20 mg total) by mouth daily.    . BELSOMRA 20 MG TABS TAKE 1 TABLET BY MOUTH AT BEDTIME AS NEEDED 30 tablet 5  . Boric Acid POWD Place 1 capsule vaginally See admin instructions. As directed for yeast infections    . Cariprazine HCl (VRAYLAR PO) Take by mouth.    . diphenhydrAMINE (BENADRYL) 25 MG tablet Take 25 mg by mouth every 6 (six) hours as needed for allergies.    . furosemide (LASIX) 40 MG tablet Take 40 mg by mouth daily as needed for fluid or edema.     . gabapentin (NEURONTIN) 100 MG capsule TAKE 1 CAPSULE BY MOUTH TWO TIMES DAILY 180 capsule 1  . hydrocortisone cream 1 % Apply 1 application topically as needed (itching).    Marland Kitchen ibuprofen (ADVIL,MOTRIN) 600 MG tablet Take 1 tablet (600 mg total) by mouth every 6 (six) hours as needed (mild pain). 30 tablet 1  . loperamide (IMODIUM) 2 MG capsule Take 2 mg by mouth 4 (four) times daily as needed for diarrhea or loose stools.    . magic mouthwash SOLN 15 mLs by Mouth Rinse route 2 (two) times daily as needed for mouth pain.   3  . mupirocin cream (BACTROBAN) 2 % Apply 1 application topically 2 (two) times daily. 15 g 0  . pantoprazole (PROTONIX) 40 MG tablet Take 40 mg by mouth 2 (two) times daily as needed (for reflux symptoms).     Marland Kitchen PHENobarbital (LUMINAL) 64.8 MG tablet Take 1 tablet (64.8 mg total) by mouth 2 (two) times daily. 60 tablet 5  . pramipexole (MIRAPEX) 0.25 MG tablet TAKE 1 TABLET BY MOUTH TWICE DAILY DURING  THE  DAY,  AND  TWO  TABLETS  AT  NIGHT 120 tablet 5  . promethazine (PHENERGAN) 25 MG tablet Take 25 mg by mouth every 6 (six) hours as needed for nausea or vomiting.    Marland Kitchen QUEtiapine (SEROQUEL) 100 MG tablet TAKE 1 & 1/2 (ONE & ONE-HALF) TABLETS BY MOUTH AT BEDTIME 45 tablet 11  . clonazePAM (KLONOPIN) 2 MG tablet Take 2 mg by mouth in the morning then 2 mg in the afternoon then 4 mg at bedtime 120 tablet 5   No facility-administered medications prior to visit.     PAST MEDICAL HISTORY: Past Medical  History:  Diagnosis Date  . Abnormal Pap smear   . Anxiety   . Bipolar disorder ([place])   . Dysmenorrhea   . Dysplasia   . Dysrhythmia    episodes of palpitations  . Dysuria   . H/O head injury   . Headache(784.0)   . High blood cholesterol   . HSV-2 infection   . Hx of hypercholesterolemia   . Hx of ovarian cyst   . Hx of seizure disorder   . Mass of ovary    left side  . Menorrhagia   . Mental retardation    mild per Dr. notes in [facility]  . Mild mental retardation   . Perimenopausal vasomotor symptoms   . PMS (premenstrual syndrome)   . PMS (premenstrual syndrome)   . PMS (premenstrual syndrome)   . RLS (  restless legs syndrome)   . Seizures ([place])   . Uterine mass   . Vaginal yeast infection 2007   recurrent   . Yeast vaginitis     PAST SURGICAL HISTORY: Past Surgical History:  Procedure Laterality Date  . ABDOMINAL HYSTERECTOMY  2013  . COLPOSCOPY    . CYSTOSCOPY Bilateral 05/30/2013   Procedure: CYSTOSCOPY;  Surgeon: Betsy Coder, MD;  Location: [facility];  Service: Gynecology;  Laterality: Bilateral;  . DILATION AND CURETTAGE OF UTERUS    . ENDOMETRIAL BIOPSY    . HYSTEROSCOPY W/ ENDOMETRIAL ABLATION    . LAPAROSCOPIC HYSTERECTOMY Right 05/30/2013   Procedure: HYSTERECTOMY TOTAL LAPAROSCOPIC;  Surgeon: Betsy Coder, MD;  Location: [facility];  Service: Gynecology;  Laterality: Right;  Right Salpingectomy  . POLYPECTOMY    . WISDOM TOOTH EXTRACTION      FAMILY HISTORY: Family History  Problem Relation Age of Onset  . Heart attack Father   . Breast cancer Sister     SOCIAL HISTORY: Social History   Socioeconomic History  . Marital status: Single    Spouse name: Not on file  . Number of children: 0  . Years of education: HS  . Highest education level: Not on file  Occupational History  . Occupation: Unemployed  Social Needs  . Financial resource strain: Not on file  . Food insecurity:    Worry: Not on file    Inability: Not on file  . Transportation  needs:    Medical: Not on file    Non-medical: Not on file  Tobacco Use  . Smoking status: Former Research scientist (life sciences)  . Smokeless tobacco: Never Used  . Tobacco comment: Quit 10 years ago.  Substance and Sexual Activity  . Alcohol use: No    Alcohol/week: 0.0 standard drinks  . Drug use: No  . Sexual activity: Not on file  Lifestyle  . Physical activity:    Days per week: Not on file    Minutes per session: Not on file  . Stress: Not on file  Relationships  . Social connections:    Talks on phone: Not on file    Gets together: Not on file    Attends religious service: Not on file    Active member of club or organization: Not on file    Attends meetings of clubs or organizations: Not on file    Relationship status: Not on file  . Intimate partner violence:    Fear of current or ex partner: Not on file    Emotionally abused: Not on file    Physically abused: Not on file    Forced sexual activity: Not on file  Other Topics Concern  . Not on file  Social History Narrative   Patient lives at home alone with her one dog and four cats.   Disabled   Education high school.   Right handed.        PHYSICAL EXAM  Vitals:   11/04/18 1117  BP: 124/80  Pulse: 87  Weight: 245 lb 12.8 oz (111.5 kg)  Height: 5\' 2"  (1.575 m)   Body mass index is 44.96 kg/m.  Generalized: Well developed, obese female in no acute distress  Head: normocephalic and atraumatic,. Oropharynx benign  Neck: Supple,  Musculoskeletal: No deformity   Neurological examination   Mentation: Alert oriented to time, place, history taking. Attention span and concentration appropriate.  Follows all commands speech and language fluent.   Cranial nerve II-XII: Pupils were equal round reactive to  light extraocular movements were full, visual field were full on confrontational test. Facial sensation and strength were normal. hearing was intact to finger rubbing bilaterally. Uvula tongue midline. head turning and shoulder  shrug were normal and symmetric.Tongue protrusion into cheek strength was normal. Motor: normal bulk and tone, full strength in the BUE, BLE,  Sensory: normal and symmetric to light touch, , in the upper and lower extremities Coordination: finger-nose-finger, heel-to-shin bilaterally, no dysmetria Reflexes: Symmetric upper and lower plantar responses were flexor bilaterally. Gait and Station: Rising up from seated position without assistance,  normal stance,   Tandem gait is unsteady.  No assistive device DIAGNOSTIC DATA (LABS, IMAGING, TESTING) - I reviewed patient records, labs, notes, testing and imaging myself where available.  Lab Results  Component Value Date   WBC 8.3 11/18/2017   HGB 11.4 (L) 11/18/2017   HCT 35.9 (L) 11/18/2017   MCV 91.3 11/18/2017   PLT 273 11/18/2017      Component Value Date/Time   NA 139 11/18/2017 2235   NA 145 (H) 08/04/2017 1427   K 3.5 11/18/2017 2235   CL 101 11/18/2017 2235   CO2 28 11/18/2017 2235   GLUCOSE 87 11/18/2017 2235   BUN 13 11/18/2017 2235   BUN 9 08/04/2017 1427   CREATININE 0.67 11/18/2017 2235   CALCIUM 8.7 (L) 11/18/2017 2235   PROT 7.2 10/12/2017 1822   PROT 8.0 08/04/2017 1427   ALBUMIN 3.5 10/12/2017 1822   ALBUMIN 4.4 08/04/2017 1427   AST 27 10/12/2017 1822   ALT 33 10/12/2017 1822   ALKPHOS 101 10/12/2017 1822   BILITOT 0.6 10/12/2017 1822   BILITOT 0.2 08/04/2017 1427   GFRNONAA >60 11/18/2017 2235   GFRAA >60 11/18/2017 2235    ASSESSMENT AND PLAN  53 y.o. year old female  has a past medical history of  Anxiety, Bipolar disorder ([place]),  Headache(784.0),  seizure disorder, here to follow-up  She reports that her headaches are improved.  She is seeing psychiatry  for her bipolar disorder.  Last seizure occurred November 18, 2017.  She does not drive a car   Continue phenobarbital at current dose  Will refill when labs back Check Pb level therapeutic level CBC CMP to monitor adverse effects of  phenobarbital Continue gabapentin 100 mg  Twice daily 12 hours apart for headache will refill Continue clonazepam at current dose RX to pt per request Follow-up in 6 months pt may aware she needs to be seen for continued [place], [facility], [facility], APRN [facility] [address] [place], [place] [address] [phone]

## 2018-11-04 ENCOUNTER — Encounter: Payer: Self-pay | Admitting: Nurse Practitioner

## 2018-11-04 ENCOUNTER — Ambulatory Visit (INDEPENDENT_AMBULATORY_CARE_PROVIDER_SITE_OTHER): Payer: Medicare Other | Admitting: Nurse Practitioner

## 2018-11-04 VITALS — BP 124/80 | HR 87 | Ht 62.0 in | Wt 245.8 lb

## 2018-11-04 DIAGNOSIS — G40909 Epilepsy, unspecified, not intractable, without status epilepticus: Secondary | ICD-10-CM | POA: Diagnosis not present

## 2018-11-04 DIAGNOSIS — G43019 Migraine without aura, intractable, without status migrainosus: Secondary | ICD-10-CM

## 2018-11-04 DIAGNOSIS — Z5181 Encounter for therapeutic drug level monitoring: Secondary | ICD-10-CM

## 2018-11-04 MED ORDER — GABAPENTIN 100 MG PO CAPS: ORAL_CAPSULE | ORAL | 1 refills | Status: DC

## 2018-11-04 MED ORDER — CLONAZEPAM 2 MG PO TABS: ORAL_TABLET | ORAL | 5 refills | Status: DC

## 2018-11-04 NOTE — Progress Notes (Signed)
I have read the note, and I agree with the clinical assessment and plan.  Monea Pesantez K Zubayr Bednarczyk   

## 2018-11-04 NOTE — Patient Instructions (Signed)
Continue phenobarbital at current dose   Check Pb level Continue gabapentin 100 mg  Twice daily 12 hours apart for headache will refill Continue clonazepam at current dose RX to pt per request Follow-up in 6 months pt may aware she needs to be seen for continued RX

## 2018-11-05 LAB — COMPREHENSIVE METABOLIC PANEL
A/G RATIO: 1.1 — AB (ref 1.2–2.2)
ALT: 15 IU/L (ref 0–32)
AST: 12 IU/L (ref 0–40)
Albumin: 3.9 g/dL (ref 3.5–5.5)
Alkaline Phosphatase: 132 IU/L — ABNORMAL HIGH (ref 39–117)
BUN/Creatinine Ratio: 13 (ref 9–23)
BUN: 9 mg/dL (ref 6–24)
Bilirubin Total: 0.2 mg/dL (ref 0.0–1.2)
CALCIUM: 9.4 mg/dL (ref 8.7–10.2)
CHLORIDE: 101 mmol/L (ref 96–106)
CO2: 25 mmol/L (ref 20–29)
Creatinine, Ser: 0.68 mg/dL (ref 0.57–1.00)
GFR calc Af Amer: 116 mL/min/{1.73_m2} (ref >59)
GFR, EST NON AFRICAN AMERICAN: 101 mL/min/{1.73_m2} (ref >59)
Globulin, Total: 3.6 g/dL (ref 1.5–4.5)
Glucose: 91 mg/dL (ref 65–99)
POTASSIUM: 4.6 mmol/L (ref 3.5–5.2)
Sodium: 141 mmol/L (ref 134–144)
Total Protein: 7.5 g/dL (ref 6.0–8.5)

## 2018-11-05 LAB — CBC WITH DIFFERENTIAL/PLATELET
BASOS: 1 %
Basophils Absolute: 0.1 10*3/uL (ref 0.0–0.2)
EOS (ABSOLUTE): 0.3 10*3/uL (ref 0.0–0.4)
Eos: 4 %
HEMOGLOBIN: 11.6 g/dL (ref 11.1–15.9)
Hematocrit: 34.9 % (ref 34.0–46.6)
Immature Grans (Abs): 0 10*3/uL (ref 0.0–0.1)
Immature Granulocytes: 0 %
LYMPHS: 22 %
Lymphocytes Absolute: 1.8 10*3/uL (ref 0.7–3.1)
MCH: 28.8 pg (ref 26.6–33.0)
MCHC: 33.2 g/dL (ref 31.5–35.7)
MCV: 87 fL (ref 79–97)
Monocytes Absolute: 0.7 10*3/uL (ref 0.1–0.9)
Monocytes: 9 %
NEUTROS ABS: 5.2 10*3/uL (ref 1.4–7.0)
NEUTROS PCT: 64 %
Platelets: 318 10*3/uL (ref 150–450)
RBC: 4.03 x10E6/uL (ref 3.77–5.28)
RDW: 12.8 % (ref 11.7–15.4)
WBC: 8.2 10*3/uL (ref 3.4–10.8)

## 2018-11-05 LAB — PHENOBARBITAL LEVEL: Phenobarbital, Serum: 25 ug/mL (ref 15–40)

## 2018-11-07 ENCOUNTER — Telehealth: Payer: Self-pay | Admitting: *Deleted

## 2018-11-07 ENCOUNTER — Other Ambulatory Visit: Payer: Self-pay | Admitting: Nurse Practitioner

## 2018-11-07 MED ORDER — PHENOBARBITAL 64.8 MG PO TABS: 64.8000 mg | ORAL_TABLET | Freq: Two times a day (BID) | ORAL | 5 refills | Status: DC

## 2018-11-07 NOTE — Telephone Encounter (Signed)
Successfully faxed refill Rx for phenobarbital to [facility] pharmacy, [facility] [address]

## 2018-11-07 NOTE — Telephone Encounter (Signed)
Spoke with patient and informed her that her labs are stable. Patient verbalized understanding, appreciation.

## 2018-11-23 ENCOUNTER — Other Ambulatory Visit (HOSPITAL_COMMUNITY): Payer: Self-pay | Admitting: Family Medicine

## 2018-11-23 ENCOUNTER — Ambulatory Visit (HOSPITAL_COMMUNITY): Admission: RE | Admit: 2018-11-23 | Payer: Medicare Other | Source: Ambulatory Visit

## 2018-11-23 ENCOUNTER — Other Ambulatory Visit: Payer: Self-pay | Admitting: Family Medicine

## 2018-11-23 ENCOUNTER — Other Ambulatory Visit (HOSPITAL_COMMUNITY): Payer: Self-pay

## 2018-11-23 DIAGNOSIS — R600 Localized edema: Secondary | ICD-10-CM

## 2018-11-23 DIAGNOSIS — M7989 Other specified soft tissue disorders: Principal | ICD-10-CM

## 2018-11-23 DIAGNOSIS — M79604 Pain in right leg: Secondary | ICD-10-CM

## 2018-11-25 ENCOUNTER — Ambulatory Visit (HOSPITAL_COMMUNITY)
Admission: RE | Admit: 2018-11-25 | Discharge: 2018-11-25 | Disposition: A | Discharge status: Home / Self Care | Payer: Medicare Other | Source: Ambulatory Visit | Attending: Family Medicine | Admitting: Family Medicine

## 2018-11-25 DIAGNOSIS — M7989 Other specified soft tissue disorders: Secondary | ICD-10-CM | POA: Insufficient documentation

## 2018-11-25 DIAGNOSIS — R600 Localized edema: Secondary | ICD-10-CM

## 2018-11-25 DIAGNOSIS — M79604 Pain in right leg: Secondary | ICD-10-CM | POA: Insufficient documentation

## 2018-11-25 NOTE — Progress Notes (Signed)
Right lower extremity venous duplex has been completed. Preliminary results can be found in CV Proc through chart review.  Results were faxed to Dr. Rex Kras.   11/25/18 1:25 PM Carlos Levering RVT

## 2018-12-05 ENCOUNTER — Ambulatory Visit
Admission: EM | Admit: 2018-12-05 | Discharge: 2018-12-05 | Disposition: A | Discharge status: Home / Self Care | Payer: Medicare Other | Attending: Family Medicine | Admitting: Family Medicine

## 2018-12-05 ENCOUNTER — Encounter: Payer: Self-pay | Admitting: Emergency Medicine

## 2018-12-05 DIAGNOSIS — R600 Localized edema: Secondary | ICD-10-CM | POA: Insufficient documentation

## 2018-12-05 DIAGNOSIS — R350 Frequency of micturition: Secondary | ICD-10-CM | POA: Diagnosis not present

## 2018-12-05 DIAGNOSIS — R300 Dysuria: Secondary | ICD-10-CM | POA: Diagnosis not present

## 2018-12-05 LAB — POCT URINALYSIS DIP (MANUAL ENTRY)
BILIRUBIN UA: NEGATIVE
BILIRUBIN UA: NEGATIVE mg/dL
Glucose, UA: NEGATIVE mg/dL
NITRITE UA: NEGATIVE
PROTEIN UA: NEGATIVE mg/dL
Spec Grav, UA: 1.015 (ref 1.010–1.025)
Urobilinogen, UA: 0.2 E.U./dL
pH, UA: 7 (ref 5.0–8.0)

## 2018-12-05 MED ORDER — FUROSEMIDE 20 MG PO TABS: 20.0000 mg | ORAL_TABLET | Freq: Every day | ORAL | 0 refills | Status: DC

## 2018-12-05 NOTE — ED Triage Notes (Signed)
Pt presents to [facility] for assessment of ankle and foot swelling x 1 month in bilateral legs, but worse in right leg.  Pt arrives with ASO on right ankle.  Patient states she has had multiple fractures in this ankle, but patient is poor historian.

## 2018-12-05 NOTE — ED Provider Notes (Signed)
[name]    CSN: [id number] Arrival date & time: 12/05/18  1028     History   Chief Complaint Chief Complaint  Patient presents with  . Leg Swelling    HPI Teresa Lawrence is a 53 y.o. female history of bipolar, seizures, presenting today for evaluation of bilateral lower leg swelling.  Patient states that over the past month she has had swelling in both legs.  She has noticed it more prominently in her right leg.  She notes that most of her pain is around her ankles.  She notes that she has broken both ankles previously in total approximately 8 times.  She was previously seen for her lower extremity swelling approximately 1/2 weeks ago, she had lower extremity Doppler right leg which was negative for DVT.  Since she has had persistent symptoms.  She has not taken anything for her symptoms.  She typically wears ASO on her right ankle.  Denies heart issues.  Denies chest pain or shortness of breath.  Denies cough.  Denies previous DVT/PE.  Denies recent travel or immobilization.  Denies exogenous estrogen use.  Denies history of smoking.   Patient was also concerned about possible UTI and she notes that she has a lot of urinary frequency.  She has had some mild dysuria.  Denies hematuria.  Denies abdominal pain, fever, nausea or vomiting.  HPI  Past Medical History:  Diagnosis Date  . Abnormal Pap smear   . Anxiety   . Bipolar disorder ([place])   . Dysmenorrhea   . Dysplasia   . Dysrhythmia    episodes of palpitations  . Dysuria   . H/O head injury   . Headache(784.0)   . High blood cholesterol   . HSV-2 infection   . Hx of hypercholesterolemia   . Hx of ovarian cyst   . Hx of seizure disorder   . Mass of ovary    left side  . Menorrhagia   . Mental retardation    mild per [facility]. notes in [facility]  . Mild mental retardation   . Perimenopausal vasomotor symptoms   . PMS (premenstrual syndrome)   . PMS (premenstrual syndrome)   . PMS (premenstrual syndrome)   .  RLS (restless legs syndrome)   . Seizures ([place])   . Uterine mass   . Vaginal yeast infection 2007   recurrent   . Yeast vaginitis     Patient Active Problem List   Diagnosis Date Noted  . Nondisplaced fracture of lateral malleolus of left fibula, initial encounter for closed fracture 10/25/2017  . Common migraine with intractable migraine 05/14/2016  . Insomnia 03/17/2013  . Headache 02/23/2013  . Hx of yeast infection 04/20/2012  . Hx of menorrhagia 04/20/2012  . 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 Bipolar disorder ([place]) 04/20/2012  . Hx of menorrhagia 04/20/2012  . Hx of ovarian cyst 04/20/2012  . Epilepsy ([place]) 04/20/2012  . Hx of seizure disorder 04/20/2012  . 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 Hx of dysmenorrhea 04/20/2012  . 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 Hx of hypercholesterolemia 04/20/2012  . Hx of PMS (premenstrual syndrome) 04/20/2012  . Encounter for therapeutic drug monitoring 05/01/2011    Past Surgical History:  Procedure Laterality Date  . ABDOMINAL HYSTERECTOMY  2013  . COLPOSCOPY    . CYSTOSCOPY Bilateral 05/30/2013   Procedure: CYSTOSCOPY;  Surgeon: Betsy Coder, MD;  Location: [facility];  Service: Gynecology;  Laterality: Bilateral;  . DILATION AND  CURETTAGE OF UTERUS    . ENDOMETRIAL BIOPSY    . HYSTEROSCOPY W/ ENDOMETRIAL ABLATION    . LAPAROSCOPIC HYSTERECTOMY Right 05/30/2013   Procedure: HYSTERECTOMY TOTAL LAPAROSCOPIC;  Surgeon: Betsy Coder, MD;  Location: [facility];  Service: Gynecology;  Laterality: Right;  Right Salpingectomy  . POLYPECTOMY    . WISDOM TOOTH EXTRACTION      OB History   No obstetric history on file.      Home Medications    Prior to Admission medications   Medication Sig Start Date End Date Taking? Authorizing Provider  acyclovir (ZOVIRAX) 200 MG capsule Take 200 mg by mouth 3 (three) times daily as needed (fever blisters).    Yes [provider]  atorvastatin (LIPITOR) 20 MG tablet  Take 1 tablet (20 mg total) by mouth daily. 08/04/17  Yes [name], MD  BELSOMRA 20 MG TABS TAKE 1 TABLET BY MOUTH AT BEDTIME AS NEEDED 07/27/18  Yes [name], MD  Boric Acid POWD Place 1 capsule vaginally See admin instructions. As directed for yeast infections    [provider]  Cariprazine HCl (VRAYLAR PO) Take by mouth.    [provider]  clonazePAM (KLONOPIN) 2 MG tablet Take 2 mg by mouth in the morning then 2 mg in the afternoon then 4 mg at bedtime 11/04/18   Dennie Bible, NP  diphenhydrAMINE (BENADRYL) 25 MG tablet Take 25 mg by mouth every 6 (six) hours as needed for allergies.    [provider]  furosemide (LASIX) 20 MG tablet Take 1 tablet (20 mg total) by mouth daily for 5 days. 12/05/18 12/10/18  ,  C, PA-C  gabapentin (NEURONTIN) 100 MG capsule TAKE 1 CAPSULE BY MOUTH TWO TIMES DAILY 11/04/18   Dennie Bible, NP  hydrocortisone cream 1 % Apply 1 application topically as needed (itching).    [provider]  ibuprofen (ADVIL,MOTRIN) 600 MG tablet Take 1 tablet (600 mg total) by mouth every 6 (six) hours as needed (mild pain). 05/31/13   Earnstine Regal, PA-C  loperamide (IMODIUM) 2 MG capsule Take 2 mg by mouth 4 (four) times daily as needed for diarrhea or loose stools.    [provider]  magic mouthwash SOLN 15 mLs by Mouth Rinse route 2 (two) times daily as needed for mouth pain.  11/08/17   [provider]  mupirocin cream (BACTROBAN) 2 % Apply 1 application topically 2 (two) times daily. 04/20/18   Patrecia Pour, MD  pantoprazole (PROTONIX) 40 MG tablet Take 40 mg by mouth 2 (two) times daily as needed (for reflux symptoms).     [provider]  PHENobarbital (LUMINAL) 64.8 MG tablet Take 1 tablet (64.8 mg total) by mouth 2 (two) times daily. 11/07/18   Dennie Bible, NP  pramipexole (MIRAPEX) 0.25 MG tablet TAKE 1 TABLET BY MOUTH TWICE DAILY DURING  THE  DAY,  AND  TWO   TABLETS  AT  NIGHT 05/24/18   Dennie Bible, NP  promethazine (PHENERGAN) 25 MG tablet Take 25 mg by mouth every 6 (six) hours as needed for nausea or vomiting.    [provider]  QUEtiapine (SEROQUEL) 100 MG tablet TAKE 1 & 1/2 (ONE & ONE-HALF) TABLETS BY MOUTH AT BEDTIME 08/22/18   Dennie Bible, NP    Family History Family History  Problem Relation Age of Onset  . Heart attack Father   . Breast cancer Sister     Social History Social  History   Tobacco Use  . Smoking status: Former Research scientist (life sciences)  . Smokeless tobacco: Never Used  . Tobacco comment: Quit 10 years ago.  Substance Use Topics  . Alcohol use: No    Alcohol/week: 0.0 standard drinks  . Drug use: No     Allergies   Requip [ropinirole hcl]; Topamax [topiramate]; and Zonegran [zonisamide]   Review of Systems Review of Systems  Constitutional: Negative for fatigue and fever.  HENT: Negative for mouth sores.   Eyes: Negative for visual disturbance.  Respiratory: Negative for cough, chest tightness and shortness of breath.   Cardiovascular: Positive for leg swelling. Negative for chest pain.  Gastrointestinal: Negative for abdominal pain, nausea and vomiting.  Genitourinary: Negative for genital sores.  Musculoskeletal: Negative for arthralgias and joint swelling.  Skin: Negative for color change, rash and wound.  Neurological: Negative for dizziness, weakness, light-headedness and headaches.     Physical Exam Triage Vital Signs ED Triage Vitals  Enc Vitals Group     BP 12/05/18 1035 116/80     Pulse Rate 12/05/18 1035 79     Resp 12/05/18 1035 18     Temp 12/05/18 1035 98.2 F (36.8 C)     Temp Source 12/05/18 1035 Oral     SpO2 12/05/18 1035 92 %     Weight --      Height --      Head Circumference --      Peak Flow --      Pain Score 12/05/18 1036 6     Pain Loc --      Pain Edu? --      Excl. in [place]? --    No data found.  Updated Vital Signs BP 116/80 (BP Location: Left Arm)    Pulse 79   Temp 98.2 F (36.8 C) (Oral)   Resp 18   LMP 05/11/2013   SpO2 92% Comment: checked on both hands  Visual Acuity Right Eye Distance:   Left Eye Distance:   Bilateral Distance:    Right Eye Near:   Left Eye Near:    Bilateral Near:     Physical Exam Vitals signs and nursing note reviewed.  Constitutional:      General: She is not in acute distress.    Appearance: She is well-developed.  HENT:     Head: Normocephalic and atraumatic.  Eyes:     Conjunctiva/sclera: Conjunctivae normal.  Neck:     Musculoskeletal: Neck supple.  Cardiovascular:     Rate and Rhythm: Normal rate and regular rhythm.     Heart sounds: No murmur.  Pulmonary:     Effort: Pulmonary effort is normal. No respiratory distress.     Breath sounds: Normal breath sounds.     Comments: Breathing comfortably at rest, CTABL, no wheezing, rales or other adventitious sounds auscultated Abdominal:     Palpations: Abdomen is soft.     Tenderness: There is no abdominal tenderness.  Musculoskeletal:        General: Swelling present.     Comments: Bilateral lower extremities with 1+ pitting edema extending to proximal shin, swelling around right ankle worse than the left ankle; pain associated with assessing edema Negative Homan's bilaterally Cap refill less than 2 seconds, dorsalis pedis 2+ bilaterally   Skin:    General: Skin is warm and dry.  Neurological:     Mental Status: She is alert.      UC Treatments / Results  Labs (all labs ordered are listed, but  only abnormal results are displayed) Labs Reviewed  POCT URINALYSIS DIP (MANUAL ENTRY) - Abnormal; Notable for the following components:      Result Value   Blood, UA moderate (*)    Leukocytes, UA Trace (*)    All other components within normal limits  URINE CULTURE    EKG None  Radiology No results found.  Procedures Procedures (including critical care time)  Medications Ordered in UC Medications - No data to  display  Initial Impression / Assessment and Plan / UC Course  I have reviewed the triage vital signs and the nursing notes.  Pertinent labs & imaging results that were available during my care of the patient were reviewed by me and considered in my medical decision making (see chart for details).  Clinical Course as of Dec 05 1252  Mon Dec 05, 2018  1050 Urobilinogen, UA: 0.2 [HW]    Clinical Course User Index [HW] ,  C, PA-C    Bilateral pitting edema to lower extremities, right worse than left, recent ultrasound 7 days ago negative for DVT.  Lung sounds clear although O2 would not read higher than 92%.  Will do trial of Lasix for 5 days to help with edema, recommended compression stockings.  At this time will not repeat ultrasound to assess for DVT, but will continue to monitor.  Negative risk factors for DVT/PE.  Will have patient follow-up if symptoms not resolving or worsening with using the Lasix.Discussed strict return precautions. Patient verbalized understanding and is agreeable with plan.  Final Clinical Impressions(s) / UC Diagnoses   Final diagnoses:  Bilateral leg edema     Discharge Instructions     Please take 1 tablet of lasix daily for the next 5 days This medicine will make you urinate more frequently, please take in the morning/early midday to avoid nighttime urination Please elevate legs May obtain compression stockings from pharmacy to wear to further help with compression and swelling Please follow-up here with your primary care if symptoms persisting, worsening, developing increased swelling in 1 leg   ED Prescriptions    Medication Sig Dispense Auth. Provider   furosemide (LASIX) 20 MG tablet Take 1 tablet (20 mg total) by mouth daily for 5 days. 5 tablet ,  C, PA-C     Controlled Substance Prescriptions [place] Controlled Substance Registry consulted? Not Applicable   Janith Lima, [place] 12/05/18 1316

## 2018-12-05 NOTE — ED Notes (Signed)
Patient able to ambulate independently  

## 2018-12-05 NOTE — Discharge Instructions (Signed)
Please take 1 tablet of lasix daily for the next 5 days This medicine will make you urinate more frequently, please take in the morning/early midday to avoid nighttime urination Please elevate legs May obtain compression stockings from pharmacy to wear to further help with compression and swelling Please follow-up here with your primary care if symptoms persisting, worsening, developing increased swelling in 1 leg

## 2018-12-06 LAB — URINE CULTURE

## 2018-12-08 ENCOUNTER — Other Ambulatory Visit: Payer: Self-pay | Admitting: Nurse Practitioner

## 2018-12-28 ENCOUNTER — Other Ambulatory Visit: Payer: Self-pay | Admitting: Neurology

## 2019-01-23 ENCOUNTER — Telehealth: Payer: Self-pay | Admitting: Neurology

## 2019-01-23 MED ORDER — PRAMIPEXOLE DIHYDROCHLORIDE 0.25 MG PO TABS: ORAL_TABLET | ORAL | 0 refills | Status: DC

## 2019-01-23 NOTE — Telephone Encounter (Signed)
Chart reviewed and refill is appropriate.  Refill given for a 90 day supply to the [facility] on [address].

## 2019-01-23 NOTE — Telephone Encounter (Signed)
Pt request refill for pramipexole (MIRAPEX) 0.25 MG tablet sent to [facility]

## 2019-02-11 ENCOUNTER — Other Ambulatory Visit: Payer: Self-pay | Admitting: Neurology

## 2019-02-13 ENCOUNTER — Other Ambulatory Visit: Payer: Self-pay | Admitting: Neurology

## 2019-02-13 ENCOUNTER — Telehealth: Payer: Self-pay | Admitting: Nurse Practitioner

## 2019-02-13 MED ORDER — SUVOREXANT 20 MG PO TABS: 1.0000 | ORAL_TABLET | Freq: Every evening | ORAL | 3 refills | Status: DC | PRN

## 2019-02-13 NOTE — Telephone Encounter (Signed)
Printed Rx from Dr. Jannifer Franklin has been signed and faxed to [facility]. Confirmation has been received.

## 2019-02-13 NOTE — Telephone Encounter (Signed)
[place] drug registry states last refill was 01/06/19 # 30 for a 30 day supply. Printed rx frp, 02/13/19 has been been placed in shred bin. If appropriate, please rx electronically, thanks!

## 2019-02-13 NOTE — Telephone Encounter (Signed)
Pt has called for a refill on her Suvorexant ([place]) [place] [phone]

## 2019-02-27 ENCOUNTER — Other Ambulatory Visit: Payer: Self-pay | Admitting: Neurology

## 2019-03-26 ENCOUNTER — Other Ambulatory Visit: Payer: Self-pay | Admitting: Neurology

## 2019-04-25 ENCOUNTER — Ambulatory Visit
Admission: EM | Admit: 2019-04-25 | Discharge: 2019-04-25 | Disposition: A | Discharge status: Home / Self Care | Payer: Medicare Other | Attending: Physician Assistant | Admitting: Physician Assistant

## 2019-04-25 ENCOUNTER — Other Ambulatory Visit: Payer: Self-pay

## 2019-04-25 DIAGNOSIS — M7989 Other specified soft tissue disorders: Secondary | ICD-10-CM | POA: Diagnosis not present

## 2019-04-25 MED ORDER — MELOXICAM 7.5 MG PO TABS: 7.5000 mg | ORAL_TABLET | Freq: Every day | ORAL | 0 refills | Status: DC

## 2019-04-25 NOTE — ED Triage Notes (Signed)
Pt c/o lt hand swelling and pain x1wk, denies injury

## 2019-04-25 NOTE — Discharge Instructions (Signed)
Start Mobic. Do not take ibuprofen (motrin/advil)/ naproxen (aleve) while on mobic. Ice compress, elevation, rest. This is most likely due to inflammation. However, if you notice increase in swelling traveling up the arm, warm, redness, go to the ED for further evaluation. Otherwise, follow up with PCP in 1 week if symptom still not improving.

## 2019-04-25 NOTE — ED Provider Notes (Signed)
[name]    CSN: [id number] Arrival date & time: 04/25/19  1007      History   Chief Complaint Chief Complaint  Patient presents with  . Hand Pain    HPI Teresa Lawrence is a 53 y.o. female.   53 year old female comes in for 1 week history of left hand swelling. Denies injury/trauma. Swelling mostly to the fingers with some to the hand. Denies erythema, warmth. Denies new contact exposure/food intake. Denies recent blood draw. Denies immobility. She has painful ROM with decreased ROM due to swelling. She denies any increase in activity. Has not taken anything for the symptoms.      Past Medical History:  Diagnosis Date  . Abnormal Pap smear   . Anxiety   . Bipolar disorder ([place])   . Dysmenorrhea   . Dysplasia   . Dysrhythmia    episodes of palpitations  . Dysuria   . H/O head injury   . Headache(784.0)   . High blood cholesterol   . HSV-2 infection   . Hx of hypercholesterolemia   . Hx of ovarian cyst   . Hx of seizure disorder   . Mass of ovary    left side  . Menorrhagia   . Mental retardation    mild per Dr[facility] in [facility]  . Mild mental retardation   . Perimenopausal vasomotor symptoms   . PMS (premenstrual syndrome)   . PMS (premenstrual syndrome)   . PMS (premenstrual syndrome)   . RLS (restless legs syndrome)   . Seizures ([place])   . Uterine mass   . Vaginal yeast infection 2007   recurrent   . Yeast vaginitis     Patient Active Problem List   Diagnosis Date Noted  . Nondisplaced fracture of lateral malleolus of left fibula, initial encounter for closed fracture 10/25/2017  . Common migraine with intractable migraine 05/14/2016  . Insomnia 03/17/2013  . Headache 02/23/2013  . Hx of yeast infection 04/20/2012  . Hx of menorrhagia 04/20/2012  . 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 Bipolar disorder ([place]) 04/20/2012  . Hx of menorrhagia 04/20/2012  . Hx of ovarian cyst 04/20/2012  . Epilepsy ([place]) 04/20/2012  . Hx  of seizure disorder 04/20/2012  . 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 Hx of dysmenorrhea 04/20/2012  . 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 Hx of hypercholesterolemia 04/20/2012  . Hx of PMS (premenstrual syndrome) 04/20/2012  . Encounter for therapeutic drug monitoring 05/01/2011    Past Surgical History:  Procedure Laterality Date  . ABDOMINAL HYSTERECTOMY  2013  . COLPOSCOPY    . CYSTOSCOPY Bilateral 05/30/2013   Procedure: CYSTOSCOPY;  Surgeon: Betsy Coder, MD;  Location: [facility];  Service: Gynecology;  Laterality: Bilateral;  . DILATION AND CURETTAGE OF UTERUS    . ENDOMETRIAL BIOPSY    . HYSTEROSCOPY W/ ENDOMETRIAL ABLATION    . LAPAROSCOPIC HYSTERECTOMY Right 05/30/2013   Procedure: HYSTERECTOMY TOTAL LAPAROSCOPIC;  Surgeon: Betsy Coder, MD;  Location: [facility];  Service: Gynecology;  Laterality: Right;  Right Salpingectomy  . POLYPECTOMY    . WISDOM TOOTH EXTRACTION      OB History   No obstetric history on file.      Home Medications    Prior to Admission medications   Medication Sig Start Date End Date Taking? Authorizing Provider  acyclovir (ZOVIRAX) 200 MG capsule Take 200 mg by mouth 3 (three) times daily as needed (fever blisters).  [provider]  atorvastatin (LIPITOR) 20 MG tablet Take 1 tablet (20 mg total) by mouth daily. 08/04/17   [name], MD  Boric Acid POWD Place 1 capsule vaginally See admin instructions. As directed for yeast infections    [provider]  Cariprazine HCl (VRAYLAR PO) Take by mouth.    [provider]  clonazePAM (KLONOPIN) 2 MG tablet Take 2 mg by mouth in the morning then 2 mg in the afternoon then 4 mg at bedtime 11/04/18   Dennie Bible, NP  diphenhydrAMINE (BENADRYL) 25 MG tablet Take 25 mg by mouth every 6 (six) hours as needed for allergies.    [provider]  furosemide (LASIX) 20 MG tablet Take 1 tablet (20 mg total) by mouth daily for 5 days. 12/05/18 12/10/18  Wieters,  Hallie C, PA-C  gabapentin (NEURONTIN) 100 MG capsule TAKE 1 CAPSULE BY MOUTH TWO TIMES DAILY 11/04/18   Dennie Bible, NP  hydrocortisone cream 1 % Apply 1 application topically as needed (itching).    [provider]  ibuprofen (ADVIL,MOTRIN) 600 MG tablet Take 1 tablet (600 mg total) by mouth every 6 (six) hours as needed (mild pain). 05/31/13   Earnstine Regal, PA-C  loperamide (IMODIUM) 2 MG capsule Take 2 mg by mouth 4 (four) times daily as needed for diarrhea or loose stools.    [provider]  magic mouthwash SOLN 15 mLs by Mouth Rinse route 2 (two) times daily as needed for mouth pain.  11/08/17   [provider]  meloxicam (MOBIC) 7.5 MG tablet Take 1 tablet (7.5 mg total) by mouth daily. 04/25/19   [name], Rolanda Campa V, PA-C  mupirocin cream (BACTROBAN) 2 % Apply 1 application topically 2 (two) times daily. 04/20/18   Patrecia Pour, MD  pantoprazole (PROTONIX) 40 MG tablet Take 40 mg by mouth 2 (two) times daily as needed (for reflux symptoms).     [provider]  PHENobarbital (LUMINAL) 64.8 MG tablet Take 1 tablet (64.8 mg total) by mouth 2 (two) times daily. 11/07/18   Dennie Bible, NP  pramipexole (MIRAPEX) 0.25 MG tablet TAKE 1 TABLET BY MOUTH TWICE DAILY AND 2 TABS AT BEDTIME 03/27/19   [name], MD  promethazine (PHENERGAN) 25 MG tablet Take 25 mg by mouth every 6 (six) hours as needed for nausea or vomiting.    [provider]  QUEtiapine (SEROQUEL) 100 MG tablet TAKE 1 & 1/2 (ONE & ONE-HALF) TABLETS BY MOUTH AT BEDTIME 08/22/18   Dennie Bible, NP  Suvorexant (BELSOMRA) 20 MG TABS Take 1 tablet by mouth at bedtime as needed. 02/13/19   [name], MD    Family History Family History  Problem Relation Age of Onset  . Heart attack Father   . Breast cancer Sister     Social History Social History   Tobacco Use  . Smoking status: Former Research scientist (life sciences)  . Smokeless tobacco: Never Used  . Tobacco comment: Quit 10 years  ago.  Substance Use Topics  . Alcohol use: No    Alcohol/week: 0.0 standard drinks  . Drug use: No     Allergies   Requip [ropinirole hcl], Topamax [topiramate], and Zonegran [zonisamide]   Review of Systems Review of Systems  Reason unable to perform ROS: See HPI as above.     Physical Exam Triage Vital Signs ED Triage Vitals [04/25/19 1019]  Enc Vitals Group     BP 136/82     Pulse Rate  75     Resp 20     Temp 98.1 F (36.7 C)     Temp Source Oral     SpO2 94 %     Weight      Height      Head Circumference      Peak Flow      Pain Score 8     Pain Loc      Pain Edu?      Excl. in [place]?    No data found.  Updated Vital Signs BP 136/82 (BP Location: Left Arm)   Pulse 75   Temp 98.1 F (36.7 C) (Oral)   Resp 20   LMP 05/11/2013   SpO2 94%   Physical Exam Constitutional:      General: She is not in acute distress.    Appearance: She is well-developed. She is not ill-appearing, toxic-appearing or diaphoretic.  HENT:     Head: Normocephalic and atraumatic.  Eyes:     Conjunctiva/sclera: Conjunctivae normal.     Pupils: Pupils are equal, round, and reactive to light.  Neck:     Musculoskeletal: Normal range of motion and neck supple.  Cardiovascular:     Rate and Rhythm: Normal rate and regular rhythm.     Heart sounds: No murmur. No friction rub. No gallop.   Pulmonary:     Effort: Pulmonary effort is normal. No respiratory distress.     Breath sounds: Normal breath sounds.  Musculoskeletal:        General: No swelling or tenderness.     Comments: Left hand and finger swelling without erythema, warmth, contusion. No swelling to the forearm. Diffuse tenderness to palpation of hand and finger. Decreased flexion due to swelling and pain. Strength normal and equal bilaterally. Sensation intact and equal bilaterally. Radial pulse 2+, cap refill <2s  Skin:    General: Skin is warm and dry.  Neurological:     Mental Status: She is alert and oriented to  person, place, and time.     UC Treatments / Results  Labs (all labs ordered are listed, but only abnormal results are displayed) Labs Reviewed - No data to display  EKG   Radiology No results found.  Procedures Procedures (including critical care time)  Medications Ordered in UC Medications - No data to display  Initial Impression / Assessment and Plan / UC Course  I have reviewed the triage vital signs and the nursing notes.  Pertinent labs & imaging results that were available during my care of the patient were reviewed by me and considered in my medical decision making (see chart for details).    No alarming signs on exam. Discussed possible inflammation causing symptoms. Lower suspicion for thrombitis given history and exam. Will start NSAIDs, ice compress, elevation. Return precautions given. Otherwise, patient to follow up with PCP in 1 week for recheck if symptoms not improving.  Final Clinical Impressions(s) / UC Diagnoses   Final diagnoses:  Swelling of left hand   ED Prescriptions    Medication Sig Dispense Auth. Provider   meloxicam (MOBIC) 7.5 MG tablet Take 1 tablet (7.5 mg total) by mouth daily. 10 tablet Tobin Chad, PA-C 04/25/19 1331

## 2019-05-05 ENCOUNTER — Ambulatory Visit: Payer: Medicare Other | Admitting: Neurology

## 2019-05-06 ENCOUNTER — Telehealth: Payer: Self-pay | Admitting: Diagnostic Neuroimaging

## 2019-05-06 MED ORDER — CLONAZEPAM 2 MG PO TABS: ORAL_TABLET | ORAL | 5 refills | Status: DC

## 2019-05-06 NOTE — Telephone Encounter (Signed)
Patient called for refill of clonazepam.   Meds ordered this encounter  Medications  . DISCONTD: clonazePAM (KLONOPIN) 2 MG tablet    Sig: Take 2 mg by mouth in the morning then 2 mg in the afternoon then 4 mg at bedtime    Dispense:  120 tablet    Refill:  5  . clonazePAM (KLONOPIN) 2 MG tablet    Sig: Take 2 mg by mouth in the morning then 2 mg in the afternoon then 4 mg at bedtime    Dispense:  120 tablet    Refill:  [place], MD 6/42/9037, 9:55 PM Certified in Neurology, Neurophysiology and Neuroimaging  [facility] [address] [place], [place] [address] [phone]

## 2019-05-08 ENCOUNTER — Other Ambulatory Visit: Payer: Self-pay

## 2019-05-08 ENCOUNTER — Encounter (HOSPITAL_COMMUNITY): Payer: Self-pay | Admitting: Emergency Medicine

## 2019-05-08 ENCOUNTER — Emergency Department (HOSPITAL_COMMUNITY)
Admission: EM | Admit: 2019-05-08 | Discharge: 2019-05-08 | Disposition: A | Discharge status: Home / Self Care | Payer: Medicare Other | Attending: Emergency Medicine | Admitting: Emergency Medicine

## 2019-05-08 DIAGNOSIS — R2232 Localized swelling, mass and lump, left upper limb: Secondary | ICD-10-CM | POA: Insufficient documentation

## 2019-05-08 DIAGNOSIS — M79642 Pain in left hand: Secondary | ICD-10-CM | POA: Diagnosis not present

## 2019-05-08 DIAGNOSIS — R2243 Localized swelling, mass and lump, lower limb, bilateral: Secondary | ICD-10-CM | POA: Diagnosis not present

## 2019-05-08 DIAGNOSIS — M7989 Other specified soft tissue disorders: Secondary | ICD-10-CM

## 2019-05-08 DIAGNOSIS — Z87891 Personal history of nicotine dependence: Secondary | ICD-10-CM | POA: Insufficient documentation

## 2019-05-08 MED ORDER — PREDNISONE 20 MG PO TABS: 40.0000 mg | ORAL_TABLET | Freq: Every day | ORAL | 0 refills | Status: AC

## 2019-05-08 MED ORDER — PREDNISONE 20 MG PO TABS
40.0000 mg | ORAL_TABLET | Freq: Once | ORAL | Status: AC
  Administered 2019-05-08: 40 mg via ORAL
  Filled 2019-05-08: qty 2

## 2019-05-08 NOTE — Discharge Instructions (Signed)
Please contact your primary care provider inform them of today's visit.  Please schedule follow-up evaluation.  As we discussed I do feel your symptoms should be evaluated by rheumatologist.  Please follow-up accordingly, return immediately if develop any new or worsening signs or symptoms.

## 2019-05-08 NOTE — ED Triage Notes (Signed)
Reports left hand pain and swelling for 3 weeks.  Reports being sent by Dr. Unk Pinto office.  Taking 2 antibiotics.  Was initially seen last week and went back today for follow up.  Also has swelling to bil lower ext with dry cracked skin.  Per patient has been going on a while.

## 2019-05-08 NOTE — ED Provider Notes (Signed)
[facility] EMERGENCY DEPARTMENT Provider Note   CSN: [id number] Arrival date & time: 05/08/19  1741    History   Chief Complaint Chief Complaint  Patient presents with  . Hand Pain    HPI Teresa Lawrence is a 53 y.o. female.     HPI   53 year old female presents today with complaints of the left hand swelling and pain.  Patient notes symptoms started approximately 2 weeks ago upon awakening.  She notes swelling to left hand no associated redness, no neurological deficits.  She denies any trauma to the hand.  She notes that she has swelling to her bilateral feet this is chronic in nature uncertain etiology.  No history of heart failure shortness of breath.  She notes she has been seen by her primary care provider with no known etiology.  She was placed on antibiotics and is currently taking them.  She denies any systemic illnesses, no history of inflammatory process.  She has not used anything for pain today.   Past Medical History:  Diagnosis Date  . Abnormal Pap smear   . Anxiety   . Bipolar disorder ([place])   . Dysmenorrhea   . Dysplasia   . Dysrhythmia    episodes of palpitations  . Dysuria   . H/O head injury   . Headache(784.0)   . High blood cholesterol   . HSV-2 infection   . Hx of hypercholesterolemia   . Hx of ovarian cyst   . Hx of seizure disorder   . Mass of ovary    left side  . Menorrhagia   . Mental retardation    mild per Dr. notes in [facility]  . Mild mental retardation   . Perimenopausal vasomotor symptoms   . PMS (premenstrual syndrome)   . PMS (premenstrual syndrome)   . PMS (premenstrual syndrome)   . RLS (restless legs syndrome)   . Seizures ([place])   . Uterine mass   . Vaginal yeast infection 2007   recurrent   . Yeast vaginitis     Patient Active Problem List   Diagnosis Date Noted  . Nondisplaced fracture of lateral malleolus of left fibula, initial encounter for closed fracture 10/25/2017  . Common migraine with  intractable migraine 05/14/2016  . Insomnia 03/17/2013  . Headache 02/23/2013  . Hx of yeast infection 04/20/2012  . Hx of menorrhagia 04/20/2012  . 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 Bipolar disorder ([place]) 04/20/2012  . Hx of menorrhagia 04/20/2012  . Hx of ovarian cyst 04/20/2012  . Epilepsy ([place]) 04/20/2012  . Hx of seizure disorder 04/20/2012  . 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 Hx of dysmenorrhea 04/20/2012  . 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 Hx of hypercholesterolemia 04/20/2012  . Hx of PMS (premenstrual syndrome) 04/20/2012  . Encounter for therapeutic drug monitoring 05/01/2011    Past Surgical History:  Procedure Laterality Date  . ABDOMINAL HYSTERECTOMY  2013  . COLPOSCOPY    . CYSTOSCOPY Bilateral 05/30/2013   Procedure: CYSTOSCOPY;  Surgeon: Betsy Coder, MD;  Location: [facility];  Service: Gynecology;  Laterality: Bilateral;  . DILATION AND CURETTAGE OF UTERUS    . ENDOMETRIAL BIOPSY    . HYSTEROSCOPY W/ ENDOMETRIAL ABLATION    . LAPAROSCOPIC HYSTERECTOMY Right 05/30/2013   Procedure: HYSTERECTOMY TOTAL LAPAROSCOPIC;  Surgeon: Betsy Coder, MD;  Location: [facility];  Service: Gynecology;  Laterality: Right;  Right Salpingectomy  . POLYPECTOMY    . WISDOM TOOTH EXTRACTION  OB History   No obstetric history on file.      Home Medications    Prior to Admission medications   Medication Sig Start Date End Date Taking? Authorizing Provider  acyclovir (ZOVIRAX) 200 MG capsule Take 200 mg by mouth 3 (three) times daily as needed (fever blisters).     [provider]  atorvastatin (LIPITOR) 20 MG tablet Take 1 tablet (20 mg total) by mouth daily. 08/04/17   [name], MD  Boric Acid POWD Place 1 capsule vaginally See admin instructions. As directed for yeast infections    [provider]  Cariprazine HCl (VRAYLAR PO) Take by mouth.    [provider]  clonazePAM (KLONOPIN) 2 MG tablet Take 2 mg by mouth  in the morning then 2 mg in the afternoon then 4 mg at bedtime 05/06/19   [name], [name], MD  diphenhydrAMINE (BENADRYL) 25 MG tablet Take 25 mg by mouth every 6 (six) hours as needed for allergies.    [provider]  furosemide (LASIX) 20 MG tablet Take 1 tablet (20 mg total) by mouth daily for 5 days. 12/05/18 12/10/18  Wieters, Hallie C, PA-C  gabapentin (NEURONTIN) 100 MG capsule TAKE 1 CAPSULE BY MOUTH TWO TIMES DAILY 11/04/18   Dennie Bible, NP  hydrocortisone cream 1 % Apply 1 application topically as needed (itching).    [provider]  ibuprofen (ADVIL,MOTRIN) 600 MG tablet Take 1 tablet (600 mg total) by mouth every 6 (six) hours as needed (mild pain). 05/31/13   Earnstine Regal, PA-C  loperamide (IMODIUM) 2 MG capsule Take 2 mg by mouth 4 (four) times daily as needed for diarrhea or loose stools.    [provider]  magic mouthwash SOLN 15 mLs by Mouth Rinse route 2 (two) times daily as needed for mouth pain.  11/08/17   [provider]  meloxicam (MOBIC) 7.5 MG tablet Take 1 tablet (7.5 mg total) by mouth daily. 04/25/19   [name], [name], PA-C  mupirocin cream (BACTROBAN) 2 % Apply 1 application topically 2 (two) times daily. 04/20/18   Patrecia Pour, MD  pantoprazole (PROTONIX) 40 MG tablet Take 40 mg by mouth 2 (two) times daily as needed (for reflux symptoms).     [provider]  PHENobarbital (LUMINAL) 64.8 MG tablet Take 1 tablet (64.8 mg total) by mouth 2 (two) times daily. 11/07/18   Dennie Bible, NP  pramipexole (MIRAPEX) 0.25 MG tablet TAKE 1 TABLET BY MOUTH TWICE DAILY AND 2 TABS AT BEDTIME 03/27/19   [name], MD  predniSONE (DELTASONE) 20 MG tablet Take 2 tablets (40 mg total) by mouth daily for 5 days. 05/08/19 05/13/19  Jendayi Berling, Dellis Filbert, PA-C  promethazine (PHENERGAN) 25 MG tablet Take 25 mg by mouth every 6 (six) hours as needed for nausea or vomiting.    [provider]  QUEtiapine (SEROQUEL) 100 MG tablet  TAKE 1 & 1/2 (ONE & ONE-HALF) TABLETS BY MOUTH AT BEDTIME 08/22/18   Dennie Bible, NP  Suvorexant (BELSOMRA) 20 MG TABS Take 1 tablet by mouth at bedtime as needed. 02/13/19   [name], MD    Family History Family History  Problem Relation Age of Onset  . Heart attack Father   . Breast cancer Sister     Social History Social History   Tobacco Use  . Smoking status: Former Research scientist (life sciences)  . Smokeless tobacco: Never Used  . Tobacco comment: Quit 10 years ago.  Substance Use  Topics  . Alcohol use: No    Alcohol/week: 0.0 standard drinks  . Drug use: No     Allergies   Requip [ropinirole hcl], Topamax [topiramate], and Zonegran [zonisamide]   Review of Systems Review of Systems  All other systems reviewed and are negative.   Physical Exam Updated Vital Signs BP 130/72   Pulse 81   Temp 98.3 F (36.8 C) (Oral)   Resp 16   Ht 5\' 4"  (1.626 m)   Wt 111.5 kg   LMP 05/11/2013   SpO2 94%   BMI 42.19 kg/m   Physical Exam Vitals signs and nursing note reviewed.  Constitutional:      Appearance: She is well-developed.  HENT:     Head: Normocephalic and atraumatic.  Eyes:     General: No scleral icterus.       Right eye: No discharge.        Left eye: No discharge.     Conjunctiva/sclera: Conjunctivae normal.     Pupils: Pupils are equal, round, and reactive to light.  Neck:     Musculoskeletal: Normal range of motion.     Vascular: No JVD.     Trachea: No tracheal deviation.  Pulmonary:     Effort: Pulmonary effort is normal.     Breath sounds: No stridor.  Musculoskeletal:     Comments: Swelling noted to the left hand, no redness or warmth to touch cap refill less than 3 seconds, radial pulse 2+ sensation intact throughout, pain with compression of the carpal tunnel-no significant swelling to the remainder of the upper extremity-no pain with range of motion at the shoulder elbow-joint compartments are soft  Neurological:     Mental Status: She is alert  and oriented to person, place, and time.     Coordination: Coordination normal.  Psychiatric:        Behavior: Behavior normal.        Thought Content: Thought content normal.        Judgment: Judgment normal.     ED Treatments / Results  Labs (all labs ordered are listed, but only abnormal results are displayed) Labs Reviewed - No data to display  EKG None  Radiology No results found.  Procedures Procedures (including critical care time)  Medications Ordered in ED Medications  predniSONE (DELTASONE) tablet 40 mg (has no administration in time range)     Initial Impression / Assessment and Plan / ED Course  I have reviewed the triage vital signs and the nursing notes.  Pertinent labs & imaging results that were available during my care of the patient were reviewed by me and considered in my medical decision making (see chart for details).         Assessment/Plan: 53 year old female presents today with complaints of swelling to the left hand.  This appears to be isolated to the hand with no signs of infectious etiology.  She does have some tenderness over the volar wrist, I do have lower suspicion for carpal tunnel syndrome given no acute neurologic complaints.  No history of DVT no findings consistent with this.  No signs or symptoms consistent with heart failure.  She will be placed in a wrist splint.  Patient likely having some inflammatory process, will place her on prednisone I do feel she needs follow-up with rheumatology.  Patient will contact her primary care provider inform her of today's visit and need for outpatient follow-up.  She is given strict return precautions.  She verbalized understanding and agreement  to today's plan had no further questions or concerns at the time of discharge.    Final Clinical Impressions(s) / ED Diagnoses   Final diagnoses:  Swelling of left hand    ED Discharge Orders         Ordered    predniSONE (DELTASONE) 20 MG tablet   Daily     05/08/19 2023           Okey Regal, PA-C 05/08/19 2026    Hayden Rasmussen, MD 05/09/19 0900

## 2019-05-09 ENCOUNTER — Other Ambulatory Visit: Payer: Self-pay | Admitting: Neurology

## 2019-05-10 ENCOUNTER — Other Ambulatory Visit: Payer: Self-pay | Admitting: *Deleted

## 2019-05-10 MED ORDER — PHENOBARBITAL 64.8 MG PO TABS: 64.8000 mg | ORAL_TABLET | Freq: Two times a day (BID) | ORAL | 1 refills | Status: DC

## 2019-05-10 NOTE — Telephone Encounter (Signed)
Drug registry checked last fill 04-10-19 #60 Phenobarbital 64.8mg  po BID.  Has appt 05-18-19.

## 2019-05-11 ENCOUNTER — Other Ambulatory Visit: Payer: Self-pay

## 2019-05-11 ENCOUNTER — Telehealth: Payer: Self-pay | Admitting: Neurology

## 2019-05-11 MED ORDER — BELSOMRA 20 MG PO TABS: 1.0000 | ORAL_TABLET | Freq: Every evening | ORAL | 3 refills | Status: DC | PRN

## 2019-05-11 NOTE — Telephone Encounter (Signed)
Pt is needing a refill on her PHENobarbital (LUMINAL) 64.8 MG tablet sent to the [facility] on [address]

## 2019-05-11 NOTE — Addendum Note (Signed)
Addended by: [name] on: 05/11/2019 04:40 PM   Modules accepted: Orders

## 2019-05-11 NOTE — Telephone Encounter (Signed)
Patient paged on call physician need refill for sleep aid, please check on patient.

## 2019-05-11 NOTE — Telephone Encounter (Signed)
The prescription for Belsomra was sent in.

## 2019-05-11 NOTE — Telephone Encounter (Signed)
Can you send refill electronically for Suvorexant (BELSOMRA) 20 MG TABS to pharmacy. It prints when I try to send it.

## 2019-05-11 NOTE — Telephone Encounter (Signed)
I called pt that the phenobarbital was sent yesterday to the pharmacy by Dr.Wilis. Pt verbalized understanding.

## 2019-05-17 NOTE — Progress Notes (Signed)
PATIENT: Teresa Lawrence DOB: [birth year]  REASON FOR VISIT: follow up HISTORY FROM: patient  HISTORY OF PRESENT ILLNESS: Today 05/18/19  Teresa Lawrence is a 53 year old female with history of headaches, bipolar disorder, and seizure disorder.  Her last seizure occurred in January 2019 .  She remains on phenobarbital 64.8 mg twice daily.  She is also taking clonazepam 2 mg twice daily and 2 tablets at bedtime for mood swings.  She takes Gabapentin for her headaches. She describes these as stabbing pain in her head that are stable and not daily.  She is under the care of psychiatry. She is taking Vraylar for her mood and it is helping. Dr. Jannifer Franklin is prescribing Belsomra for sleep, and Mirapex for restless leg syndrome. She was seen in the ER 04/25/2019 and 05/08/2019 for hand pain, 12/05/2018 for leg swelling. She lives with her mom and 2 cats. She does not work or drive a car. She is wearing brace to left hand.  She presents today for follow-up unaccompanied.  HISTORY  1/17/2020CM Teresa Lawrence, 53 year old female returns for follow-up with history of headaches and seizure disorder.  Her last seizure occurred November 18, 2017.  She remains on phenobarbital twice daily.  She is also on clonazepam 2 mg twice daily and 2 tablets at bedtime.  She takes Neurontin for her headaches which are stable she does not drive a motor vehicle.  She has a history of bipolar disorder and sees Dr. Casimiro Needle who has recently had some changes in her medications however she cannot tell me what they are.  She returns for reevaluation  REVIEW OF SYSTEMS: Out of a complete 14 system review of symptoms, the patient complains only of the following symptoms, and all other reviewed systems are negative.  Headaches, seizures, bipolar disorder  ALLERGIES: Allergies  Allergen Reactions   Requip [Ropinirole Hcl] Nausea And Vomiting   Topamax [Topiramate] Nausea Only   Zonegran [Zonisamide] Nausea Only    HOME  MEDICATIONS: Outpatient Medications Prior to Visit  Medication Sig Dispense Refill   acyclovir (ZOVIRAX) 200 MG capsule Take 200 mg by mouth 3 (three) times daily as needed (fever blisters).      atorvastatin (LIPITOR) 20 MG tablet Take 1 tablet (20 mg total) by mouth daily.     Boric Acid POWD Place 1 capsule vaginally See admin instructions. As directed for yeast infections     Cariprazine HCl (VRAYLAR PO) Take by mouth.     clonazePAM (KLONOPIN) 2 MG tablet Take 2 mg by mouth in the morning then 2 mg in the afternoon then 4 mg at bedtime 120 tablet 5   diphenhydrAMINE (BENADRYL) 25 MG tablet Take 25 mg by mouth every 6 (six) hours as needed for allergies.     gabapentin (NEURONTIN) 100 MG capsule TAKE 1 CAPSULE BY MOUTH TWO TIMES DAILY 180 capsule 1   hydrocortisone cream 1 % Apply 1 application topically as needed (itching).     ibuprofen (ADVIL,MOTRIN) 600 MG tablet Take 1 tablet (600 mg total) by mouth every 6 (six) hours as needed (mild pain). 30 tablet 1   loperamide (IMODIUM) 2 MG capsule Take 2 mg by mouth 4 (four) times daily as needed for diarrhea or loose stools.     magic mouthwash SOLN 15 mLs by Mouth Rinse route 2 (two) times daily as needed for mouth pain.   3   meloxicam (MOBIC) 7.5 MG tablet Take 1 tablet (7.5 mg total) by mouth daily. 10 tablet 0  mupirocin cream (BACTROBAN) 2 % Apply 1 application topically 2 (two) times daily. 15 g 0   pantoprazole (PROTONIX) 40 MG tablet Take 40 mg by mouth 2 (two) times daily as needed (for reflux symptoms).      PHENobarbital (LUMINAL) 64.8 MG tablet Take 1 tablet (64.8 mg total) by mouth 2 (two) times daily. 180 tablet 1   pramipexole (MIRAPEX) 0.25 MG tablet TAKE 1 TABLET BY MOUTH TWICE DAILY AND 2 AT BEDTIME 120 tablet 0   promethazine (PHENERGAN) 25 MG tablet Take 25 mg by mouth every 6 (six) hours as needed for nausea or vomiting.     QUEtiapine (SEROQUEL) 100 MG tablet TAKE 1 & 1/2 (ONE & ONE-HALF) TABLETS BY  MOUTH AT BEDTIME 45 tablet 11   Suvorexant (BELSOMRA) 20 MG TABS Take 1 tablet by mouth at bedtime as needed. 30 tablet 3   furosemide (LASIX) 20 MG tablet Take 1 tablet (20 mg total) by mouth daily for 5 days. 5 tablet 0   No facility-administered medications prior to visit.     PAST MEDICAL HISTORY: Past Medical History:  Diagnosis Date   Abnormal Pap smear    Anxiety    Bipolar disorder ([place])    Dysmenorrhea    Dysplasia    Dysrhythmia    episodes of palpitations   Dysuria    H/O head injury    Headache(784.0)    High blood cholesterol    HSV-2 infection    Hx of hypercholesterolemia    Hx of ovarian cyst    Hx of seizure disorder    Mass of ovary    left side   Menorrhagia    Mental retardation    mild per Dr. notes in [facility]   Mild mental retardation    Perimenopausal vasomotor symptoms    PMS (premenstrual syndrome)    PMS (premenstrual syndrome)    PMS (premenstrual syndrome)    RLS (restless legs syndrome)    Seizures ([place])    Uterine mass    Vaginal yeast infection 2007   recurrent    Yeast vaginitis     PAST SURGICAL HISTORY: Past Surgical History:  Procedure Laterality Date   ABDOMINAL HYSTERECTOMY  2013   COLPOSCOPY     CYSTOSCOPY Bilateral 05/30/2013   Procedure: CYSTOSCOPY;  Surgeon: Betsy Coder, MD;  Location: [facility];  Service: Gynecology;  Laterality: Bilateral;   DILATION AND CURETTAGE OF UTERUS     ENDOMETRIAL BIOPSY     HYSTEROSCOPY W/ ENDOMETRIAL ABLATION     LAPAROSCOPIC HYSTERECTOMY Right 05/30/2013   Procedure: HYSTERECTOMY TOTAL LAPAROSCOPIC;  Surgeon: Betsy Coder, MD;  Location: [facility];  Service: Gynecology;  Laterality: Right;  Right Salpingectomy   POLYPECTOMY     WISDOM TOOTH EXTRACTION      FAMILY HISTORY: Family History  Problem Relation Age of Onset   Heart attack Father    Breast cancer Sister     SOCIAL HISTORY: Social History   Socioeconomic History   Marital status:  Single    Spouse name: Not on file   Number of children: 0   Years of education: HS   Highest education level: Not on file  Occupational History   Occupation: Unemployed  Scientist, product/process development strain: Not on file   Food insecurity    Worry: Not on file    Inability: Not on file   Transportation needs    Medical: Not on file    Non-medical: Not on file  Tobacco  Use   Smoking status: Former Smoker   Smokeless tobacco: Never Used   Tobacco comment: Quit 10 years ago.  Substance and Sexual Activity   Alcohol use: No    Alcohol/week: 0.0 standard drinks   Drug use: No   Sexual activity: Not on file  Lifestyle   Physical activity    Days per week: Not on file    Minutes per session: Not on file   Stress: Not on file  Relationships   Social connections    Talks on phone: Not on file    Gets together: Not on file    Attends religious service: Not on file    Active member of club or organization: Not on file    Attends meetings of clubs or organizations: Not on file    Relationship status: Not on file   Intimate partner violence    Fear of current or ex partner: Not on file    Emotionally abused: Not on file    Physically abused: Not on file    Forced sexual activity: Not on file  Other Topics Concern   Not on file  Social History Narrative   Patient lives at home alone with her one dog and four cats.   Disabled   Education high school.   Right handed.       PHYSICAL EXAM  Vitals:   05/18/19 1056  BP: 123/81  Pulse: 73  Temp: (!) 97.1 F (36.2 C)  Weight: 238 lb 6.4 oz (108.1 kg)  Height: 5\' 2"  (1.575 m)   Body mass index is 43.6 kg/m.  Generalized: Well developed, in no acute distress   Neurological examination  Mentation: Alert oriented to time, place, history taking. Follows all commands speech and language fluent Cranial nerve II-XII: Pupils were equal round reactive to light. Extraocular movements were full, visual field  were full on confrontational test. Facial sensation and strength were normal.  Head turning and shoulder shrug  were normal and symmetric. Motor: The motor testing reveals 5 over 5 strength of all 4 extremities. Good symmetric motor tone is noted throughout.  Wearing a left wrist brace Sensory: Sensory testing is intact to soft touch on all 4 extremities. No evidence of extinction is noted.  Coordination: Cerebellar testing reveals good finger-nose-finger and heel-to-shin bilaterally.  Gait and station: Gait is mildly unsteady, wide-based. Tandem gait is mildly unsteady Reflexes: Deep tendon reflexes are symmetric and normal bilaterally.   DIAGNOSTIC DATA (LABS, IMAGING, TESTING) - I reviewed patient records, labs, notes, testing and imaging myself where available.  Lab Results  Component Value Date   WBC 8.2 11/04/2018   HGB 11.6 11/04/2018   HCT 34.9 11/04/2018   MCV 87 11/04/2018   PLT 318 11/04/2018      Component Value Date/Time   NA 141 11/04/2018 1156   K 4.6 11/04/2018 1156   CL 101 11/04/2018 1156   CO2 25 11/04/2018 1156   GLUCOSE 91 11/04/2018 1156   GLUCOSE 87 11/18/2017 2235   BUN 9 11/04/2018 1156   CREATININE 0.68 11/04/2018 1156   CALCIUM 9.4 11/04/2018 1156   PROT 7.5 11/04/2018 1156   ALBUMIN 3.9 11/04/2018 1156   AST 12 11/04/2018 1156   ALT 15 11/04/2018 1156   ALKPHOS 132 (H) 11/04/2018 1156   BILITOT 0.2 11/04/2018 1156   GFRNONAA 101 11/04/2018 1156   GFRAA 116 11/04/2018 1156   No results found for: CHOL, HDL, LDLCALC, LDLDIRECT, TRIG, CHOLHDL No results found for: HGBA1C No results  found for: VITAMINB12 No results found for: TSH  ASSESSMENT AND PLAN 53 y.o. year old female  has a past medical history of Abnormal Pap smear, Anxiety, Bipolar disorder ([place]), Dysmenorrhea, Dysplasia, Dysrhythmia, Dysuria, H/O head injury, Headache(784.0), High blood cholesterol, HSV-2 infection, hypercholesterolemia, ovarian cyst, seizure disorder, Mass of ovary,  Menorrhagia, Mental retardation, Mild mental retardation, Perimenopausal vasomotor symptoms, PMS (premenstrual syndrome), PMS (premenstrual syndrome), PMS (premenstrual syndrome), RLS (restless legs syndrome), Seizures ([place]), Uterine mass, Vaginal yeast infection (2007), and Yeast vaginitis. here with:  1.  Headache 2.  Seizure disorder 3.  Bipolar disorder  Overall, her condition is stable.  She will continue gabapentin 100 mg twice a day for headache.  She has not had recurrent seizure since January 2019.  I will check a phenobarbital level, along with CBC and CMP.  I encouraged her to continue follow-up with her psychiatrist for management of her bipolar disorder.  She has been in the ER twice for left hand pain, is wearing a wrist brace.  I encouraged her to follow-up with her primary care regarding this, the ER note indicates a rheumatology referral may be indicated.  I will refill gabapentin, all other prescriptions are current at this time.  She will follow-up in 6 months or sooner if needed.  I advised that if her symptoms worsen or she develops any new symptoms she should let us know.   -Belsomra 20 mg at bedtime as needed filled 05/11/2019 -Seroquel 100 mg, 1.5 tablets at bedtime 08/22/2018  -Mirapex 0.25 mg, 1 tablet twice daily, 2 at bedtime -Phenobarbital 65.8 mg tablet, 1 tablet twice a day7/22/2020 -Gabapentin 100 mg capsule, 1 capsule twice a day (I will fill today) -Clonazepam 2 mg tablet, 2 mg in the morning, 2 mg in the afternoon, 4 mg at bedtime (filled 05/06/2019, 5 refills)   I spent 25 minutes with the patient. 50% of this time was spent discussing her plan of care.   Butler Denmark, AGNP-C, DNP 05/18/2019, 11:14 AM [facility] [address], [place], [place] [address] [phone]

## 2019-05-18 ENCOUNTER — Encounter: Payer: Self-pay | Admitting: Neurology

## 2019-05-18 ENCOUNTER — Ambulatory Visit (INDEPENDENT_AMBULATORY_CARE_PROVIDER_SITE_OTHER): Payer: Medicare Other | Admitting: Neurology

## 2019-05-18 ENCOUNTER — Other Ambulatory Visit: Payer: Self-pay

## 2019-05-18 VITALS — BP 123/81 | HR 73 | Temp 97.1°F | Ht 62.0 in | Wt 238.4 lb

## 2019-05-18 DIAGNOSIS — G43019 Migraine without aura, intractable, without status migrainosus: Secondary | ICD-10-CM | POA: Diagnosis not present

## 2019-05-18 DIAGNOSIS — G40909 Epilepsy, unspecified, not intractable, without status epilepticus: Secondary | ICD-10-CM | POA: Diagnosis not present

## 2019-05-18 MED ORDER — GABAPENTIN 100 MG PO CAPS: ORAL_CAPSULE | ORAL | 1 refills | Status: DC

## 2019-05-18 NOTE — Progress Notes (Signed)
I have read the note, and I agree with the clinical assessment and plan.  Teresa Lawrence   

## 2019-05-18 NOTE — Patient Instructions (Signed)
It was wonderful to meet you today! Please continue doses of medication as prescribed. We will not make any changes today. We will check lab work today. I will see you in 6 months.

## 2019-05-19 LAB — CBC WITH DIFFERENTIAL/PLATELET
Basophils Absolute: 0 10*3/uL (ref 0.0–0.2)
Basos: 0 %
EOS (ABSOLUTE): 0.3 10*3/uL (ref 0.0–0.4)
Eos: 3 %
Hematocrit: 40.7 % (ref 34.0–46.6)
Hemoglobin: 13.1 g/dL (ref 11.1–15.9)
Immature Grans (Abs): 0 10*3/uL (ref 0.0–0.1)
Immature Granulocytes: 0 %
Lymphocytes Absolute: 1.8 10*3/uL (ref 0.7–3.1)
Lymphs: 20 %
MCH: 29.2 pg (ref 26.6–33.0)
MCHC: 32.2 g/dL (ref 31.5–35.7)
MCV: 91 fL (ref 79–97)
Monocytes Absolute: 0.7 10*3/uL (ref 0.1–0.9)
Monocytes: 7 %
Neutrophils Absolute: 6.3 10*3/uL (ref 1.4–7.0)
Neutrophils: 70 %
Platelets: 348 10*3/uL (ref 150–450)
RBC: 4.49 x10E6/uL (ref 3.77–5.28)
RDW: 13.2 % (ref 11.7–15.4)
WBC: 9.1 10*3/uL (ref 3.4–10.8)

## 2019-05-19 LAB — COMPREHENSIVE METABOLIC PANEL
ALT: 24 IU/L (ref 0–32)
AST: 15 IU/L (ref 0–40)
Albumin/Globulin Ratio: 1.2 (ref 1.2–2.2)
Albumin: 4.3 g/dL (ref 3.8–4.9)
Alkaline Phosphatase: 148 IU/L — ABNORMAL HIGH (ref 39–117)
BUN/Creatinine Ratio: 10 (ref 9–23)
BUN: 7 mg/dL (ref 6–24)
Bilirubin Total: 0.3 mg/dL (ref 0.0–1.2)
CO2: 27 mmol/L (ref 20–29)
Calcium: 10 mg/dL (ref 8.7–10.2)
Chloride: 101 mmol/L (ref 96–106)
Creatinine, Ser: 0.68 mg/dL (ref 0.57–1.00)
GFR calc Af Amer: 116 mL/min/{1.73_m2} (ref >59)
GFR calc non Af Amer: 101 mL/min/{1.73_m2} (ref >59)
Globulin, Total: 3.5 g/dL (ref 1.5–4.5)
Glucose: 93 mg/dL (ref 65–99)
Potassium: 4.8 mmol/L (ref 3.5–5.2)
Sodium: 145 mmol/L — ABNORMAL HIGH (ref 134–144)
Total Protein: 7.8 g/dL (ref 6.0–8.5)

## 2019-05-19 LAB — PHENOBARBITAL LEVEL: Phenobarbital, Serum: 25 ug/mL (ref 15–40)

## 2019-05-22 ENCOUNTER — Telehealth: Payer: Self-pay | Admitting: *Deleted

## 2019-05-22 NOTE — Telephone Encounter (Signed)
-----   Message from [name], NP sent at 05/20/2019  9:09 PM EDT ----- Please call the patient. Labs look are stable. Sodium level mildly elevated 145, stay hydrated with water. Good level of phenobarbital.

## 2019-05-22 NOTE — Telephone Encounter (Signed)
I called pt and relayed that her labs were stable.  Na+ elevated at 145, encourage staying hydrated.  Phenobarbital level good.  She verbalized understanding.

## 2019-06-17 ENCOUNTER — Other Ambulatory Visit: Payer: Self-pay | Admitting: Neurology

## 2019-06-20 ENCOUNTER — Encounter: Payer: Self-pay | Admitting: Orthopaedic Surgery

## 2019-06-20 ENCOUNTER — Ambulatory Visit (INDEPENDENT_AMBULATORY_CARE_PROVIDER_SITE_OTHER): Payer: Medicare Other | Admitting: Orthopaedic Surgery

## 2019-06-20 DIAGNOSIS — M7989 Other specified soft tissue disorders: Secondary | ICD-10-CM

## 2019-06-20 NOTE — Progress Notes (Signed)
Office Visit Note   Patient: Teresa Lawrence           Date of Birth: 1966/05/31           MRN: [id number] Visit Date: 06/20/2019              Requested by: Tamsen Roers, [place],  [place] [address] PCP: Tamsen Roers, MD   Assessment & Plan: Visit Diagnoses:  1. Right leg swelling   2. Left leg swelling     Plan: Impression is bilateral lower extremity swelling right greater than left.  I do not believe this to be an orthopedic issue.  She has been worked up for DVTs which have been negative.  I will refer her back to her primary care provider for further work-up.  Follow-up with [place] as needed.  Follow-Up Instructions: Return if symptoms worsen or fail to improve.   Orders:  No orders of the defined types were placed in this encounter.  No orders of the defined types were placed in this encounter.     Procedures: No procedures performed   Clinical Data: No additional findings.   Subjective: Chief Complaint  Patient presents with  . Left Leg - Pain  . Right Leg - Pain    HPI patient is a pleasant 53 year old female who presents our clinic today with bilateral leg swelling right greater than left.  She was seen in the ED several months ago for this.  Negative Doppler ultrasound.  She comes in today for further evaluation treatment recommendationThere is no associated pain.  The swelling has been ongoing for the past several months without any recent injury or change in activity.  She does have a remote history of bilateral ankle sprain/fracture, she has not been wearing compression socks.  No history of heart or lung problems.  Review of Systems as detailed in HPI.  All others reviewed and are negative.   Objective: Vital Signs: LMP 05/11/2013   Physical Exam well-developed and well-nourished female no acute distress.  Alert and oriented x3.  Ortho Exam examination of bilateral lower extremities reveals swelling throughout right greater than left with  1+ pitting edema to the ankles.  No bony tenderness.  Full range of motion without pain.  She is neurovascular intact distally.  Specialty Comments:  No specialty comments available.  Imaging: No new imaging   PMFS History: Patient Active Problem List   Diagnosis Date Noted  . Nondisplaced fracture of lateral malleolus of left fibula, initial encounter for closed fracture 10/25/2017  . Common migraine with intractable migraine 05/14/2016  . Insomnia 03/17/2013  . Headache 02/23/2013  . Hx of yeast infection 04/20/2012  . Hx of menorrhagia 04/20/2012  . 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 Bipolar disorder ([place]) 04/20/2012  . Hx of menorrhagia 04/20/2012  . Hx of ovarian cyst 04/20/2012  . Epilepsy ([place]) 04/20/2012  . Hx of seizure disorder 04/20/2012  . 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 Hx of dysmenorrhea 04/20/2012  . 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 Hx of hypercholesterolemia 04/20/2012  . Hx of PMS (premenstrual syndrome) 04/20/2012  . Encounter for therapeutic drug monitoring 05/01/2011   Past Medical History:  Diagnosis Date  . Abnormal Pap smear   . Anxiety   . Bipolar disorder ([place])   . Dysmenorrhea   . Dysplasia   . Dysrhythmia    episodes of palpitations  . Dysuria   . H/O head injury   .  Headache(784.0)   . High blood cholesterol   . HSV-2 infection   . Hx of hypercholesterolemia   . Hx of ovarian cyst   . Hx of seizure disorder   . Mass of ovary    left side  . Menorrhagia   . Mental retardation    mild per Dr. notes in [facility]  . Mild mental retardation   . Perimenopausal vasomotor symptoms   . PMS (premenstrual syndrome)   . PMS (premenstrual syndrome)   . PMS (premenstrual syndrome)   . RLS (restless legs syndrome)   . Seizures ([place])   . Uterine mass   . Vaginal yeast infection 2007   recurrent   . Yeast vaginitis     Family History  Problem Relation Age of Onset  . Heart attack Father   . Breast cancer Sister      Past Surgical History:  Procedure Laterality Date  . ABDOMINAL HYSTERECTOMY  2013  . COLPOSCOPY    . CYSTOSCOPY Bilateral 05/30/2013   Procedure: CYSTOSCOPY;  Surgeon: Betsy Coder, MD;  Location: [facility];  Service: Gynecology;  Laterality: Bilateral;  . DILATION AND CURETTAGE OF UTERUS    . ENDOMETRIAL BIOPSY    . HYSTEROSCOPY W/ ENDOMETRIAL ABLATION    . LAPAROSCOPIC HYSTERECTOMY Right 05/30/2013   Procedure: HYSTERECTOMY TOTAL LAPAROSCOPIC;  Surgeon: Betsy Coder, MD;  Location: [facility];  Service: Gynecology;  Laterality: Right;  Right Salpingectomy  . POLYPECTOMY    . WISDOM TOOTH EXTRACTION     Social History   Occupational History  . Occupation: Unemployed  Tobacco Use  . Smoking status: Former Research scientist (life sciences)  . Smokeless tobacco: Never Used  . Tobacco comment: Quit 10 years ago.  Substance and Sexual Activity  . Alcohol use: No    Alcohol/week: 0.0 standard drinks  . Drug use: No  . Sexual activity: Not on file

## 2019-06-21 ENCOUNTER — Other Ambulatory Visit: Payer: Self-pay | Admitting: *Deleted

## 2019-06-21 MED ORDER — PRAMIPEXOLE DIHYDROCHLORIDE 0.25 MG PO TABS: ORAL_TABLET | ORAL | 5 refills | Status: DC

## 2019-07-10 ENCOUNTER — Ambulatory Visit (INDEPENDENT_AMBULATORY_CARE_PROVIDER_SITE_OTHER): Payer: Medicare Other

## 2019-07-10 ENCOUNTER — Other Ambulatory Visit: Payer: Self-pay

## 2019-07-10 ENCOUNTER — Encounter: Payer: Self-pay | Admitting: Emergency Medicine

## 2019-07-10 ENCOUNTER — Ambulatory Visit
Admission: EM | Admit: 2019-07-10 | Discharge: 2019-07-10 | Disposition: A | Discharge status: Home / Self Care | Payer: Medicare Other | Attending: Emergency Medicine | Admitting: Emergency Medicine

## 2019-07-10 DIAGNOSIS — M5030 Other cervical disc degeneration, unspecified cervical region: Secondary | ICD-10-CM

## 2019-07-10 MED ORDER — PREDNISONE 10 MG (21) PO TBPK: ORAL_TABLET | Freq: Every day | ORAL | 0 refills | Status: DC

## 2019-07-10 NOTE — ED Notes (Signed)
Patient able to ambulate independently  

## 2019-07-10 NOTE — ED Triage Notes (Signed)
Pt presents to [facility] for assessment of bilateral shoulder pain x almost 2 weeks since getting her shingles shot.  Patient states reaching backwards or up to wash her hair, causes her pain.  Worse on the right side, shot was done in the left arm.

## 2019-07-10 NOTE — ED Provider Notes (Signed)
[name]    CSN: [id number] Arrival date & time: 07/10/19  0903      History   Chief Complaint Chief Complaint  Patient presents with   Shoulder Pain    HPI Teresa Lawrence is a 53 y.o. female with history of cognitive impairment, seizures presenting for bilateral shoulder pain.  History obtained by patient in office, confirmed by mother who is legal guardian with patient's permission curbside.  Last known seizure was when patient was in high school.  No recent fall, injury.  Unsure if they slept on her side "weird ".  Overall, patient has had increased difficulty in raising her arms overhead, as well as turning neck side to side.  Has not tried anything for this.    Past Medical History:  Diagnosis Date   Abnormal Pap smear    Anxiety    Bipolar disorder ([place])    Dysmenorrhea    Dysplasia    Dysrhythmia    episodes of palpitations   Dysuria    H/O head injury    Headache(784.0)    High blood cholesterol    HSV-2 infection    Hx of hypercholesterolemia    Hx of ovarian cyst    Hx of seizure disorder    Mass of ovary    left side   Menorrhagia    Mental retardation    mild per Dr. notes in [facility]   Mild mental retardation    Perimenopausal vasomotor symptoms    PMS (premenstrual syndrome)    PMS (premenstrual syndrome)    PMS (premenstrual syndrome)    RLS (restless legs syndrome)    Seizures ([place])    Uterine mass    Vaginal yeast infection 2007   recurrent    Yeast vaginitis     Patient Active Problem List   Diagnosis Date Noted   Nondisplaced fracture of lateral malleolus of left fibula, initial encounter for closed fracture 10/25/2017   Common migraine with intractable migraine 05/14/2016   Insomnia 03/17/2013   Headache 02/23/2013   Hx of yeast infection 04/20/2012   Hx of menorrhagia 04/20/2012   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   Bipolar disorder ([place]) 04/20/2012   Hx of  menorrhagia 04/20/2012   Hx of ovarian cyst 04/20/2012   Epilepsy ([place]) 04/20/2012   Hx of seizure disorder 04/20/2012   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   Hx of dysmenorrhea 04/20/2012   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   Hx of hypercholesterolemia 04/20/2012   Hx of PMS (premenstrual syndrome) 04/20/2012   Encounter for therapeutic drug monitoring 05/01/2011    Past Surgical History:  Procedure Laterality Date   ABDOMINAL HYSTERECTOMY  2013   COLPOSCOPY     CYSTOSCOPY Bilateral 05/30/2013   Procedure: CYSTOSCOPY;  Surgeon: Betsy Coder, MD;  Location: [facility];  Service: Gynecology;  Laterality: Bilateral;   DILATION AND CURETTAGE OF UTERUS     ENDOMETRIAL BIOPSY     HYSTEROSCOPY W/ ENDOMETRIAL ABLATION     LAPAROSCOPIC HYSTERECTOMY Right 05/30/2013   Procedure: HYSTERECTOMY TOTAL LAPAROSCOPIC;  Surgeon: Betsy Coder, MD;  Location: [facility];  Service: Gynecology;  Laterality: Right;  Right Salpingectomy   POLYPECTOMY     WISDOM TOOTH EXTRACTION      OB History   No obstetric history on file.      Home Medications    Prior to Admission medications   Medication Sig Start Date End Date Taking? Authorizing Provider  acyclovir (ZOVIRAX) 200 MG capsule Take 200  mg by mouth 3 (three) times daily as needed (fever blisters).     [provider]  atorvastatin (LIPITOR) 20 MG tablet Take 1 tablet (20 mg total) by mouth daily. 08/04/17   [name], MD  Boric Acid POWD Place 1 capsule vaginally See admin instructions. As directed for yeast infections    [provider]  Cariprazine HCl (VRAYLAR PO) Take by mouth.    [provider]  clonazePAM (KLONOPIN) 2 MG tablet Take 2 mg by mouth in the morning then 2 mg in the afternoon then 4 mg at bedtime 05/06/19   [name], [name], MD  diphenhydrAMINE (BENADRYL) 25 MG tablet Take 25 mg by mouth every 6 (six) hours as needed for allergies.    [provider]  furosemide (LASIX)  20 MG tablet Take 1 tablet (20 mg total) by mouth daily for 5 days. 12/05/18 12/10/18  Wieters, Hallie C, PA-C  gabapentin (NEURONTIN) 100 MG capsule TAKE 1 CAPSULE BY MOUTH TWO TIMES DAILY 05/18/19   [name], NP  hydrocortisone cream 1 % Apply 1 application topically as needed (itching).    [provider]  ibuprofen (ADVIL,MOTRIN) 600 MG tablet Take 1 tablet (600 mg total) by mouth every 6 (six) hours as needed (mild pain). 05/31/13   Earnstine Regal, PA-C  loperamide (IMODIUM) 2 MG capsule Take 2 mg by mouth 4 (four) times daily as needed for diarrhea or loose stools.    [provider]  magic mouthwash SOLN 15 mLs by Mouth Rinse route 2 (two) times daily as needed for mouth pain.  11/08/17   [provider]  meloxicam (MOBIC) 7.5 MG tablet Take 1 tablet (7.5 mg total) by mouth daily. 04/25/19   [name], [name], PA-C  mupirocin cream (BACTROBAN) 2 % Apply 1 application topically 2 (two) times daily. 04/20/18   Patrecia Pour, MD  pantoprazole (PROTONIX) 40 MG tablet Take 40 mg by mouth 2 (two) times daily as needed (for reflux symptoms).     [provider]  PHENobarbital (LUMINAL) 64.8 MG tablet Take 1 tablet (64.8 mg total) by mouth 2 (two) times daily. 05/10/19   [name], MD  pramipexole (MIRAPEX) 0.25 MG tablet TAKE 1 TABLET BY MOUTH TWICE DAILY AND 2 TABLETS AT BEDTIME 06/21/19   [name], NP  predniSONE (STERAPRED UNI-PAK 21 TAB) 10 MG (21) TBPK tablet Take by mouth daily. Take steroid taper as written 07/10/19   [name], [name], PA-C  promethazine (PHENERGAN) 25 MG tablet Take 25 mg by mouth every 6 (six) hours as needed for nausea or vomiting.    [provider]  QUEtiapine (SEROQUEL) 100 MG tablet TAKE 1 & 1/2 (ONE & ONE-HALF) TABLETS BY MOUTH AT BEDTIME 08/22/18   Dennie Bible, NP  Suvorexant (BELSOMRA) 20 MG TABS Take 1 tablet by mouth at bedtime as needed. 05/11/19   [name], MD    Family History Family History    Problem Relation Age of Onset   Heart attack Father    Breast cancer Sister     Social History Social History   Tobacco Use   Smoking status: Former Smoker   Smokeless tobacco: Never Used   Tobacco comment: Quit 10 years ago.  Substance Use Topics   Alcohol use: No    Alcohol/week: 0.0 standard drinks   Drug use: No     Allergies   Requip [ropinirole hcl], Topamax [topiramate], and Zonegran [zonisamide]   Review of Systems Review  of Systems  Constitutional: Negative for fatigue and fever.  Respiratory: Negative for cough and shortness of breath.   Cardiovascular: Negative for chest pain and palpitations.  Musculoskeletal:       Positive for lateral shoulder pain, decreased range of motion of shoulders, neck Negative for neck pain  Neurological: Negative for dizziness, tremors, seizures, syncope, facial asymmetry, speech difficulty, weakness, light-headedness, numbness and headaches.     Physical Exam Triage Vital Signs ED Triage Vitals [07/10/19 0911]  Enc Vitals Group     BP 104/72     Pulse Rate 92     Resp 18     Temp 97.7 F (36.5 C)     Temp Source Oral     SpO2 95 %     Weight      Height      Head Circumference      Peak Flow      Pain Score 10     Pain Loc      Pain Edu?      Excl. in [place]?    No data found.  Updated Vital Signs BP 104/72 (BP Location: Left Arm)    Pulse 92    Temp 97.7 F (36.5 C) (Oral)    Resp 18    LMP 05/11/2013    SpO2 95%   Visual Acuity Right Eye Distance:   Left Eye Distance:   Bilateral Distance:    Right Eye Near:   Left Eye Near:    Bilateral Near:     Physical Exam Constitutional:      General: She is not in acute distress.    Appearance: She is not ill-appearing.  HENT:     Head: Normocephalic and atraumatic.     Mouth/Throat:     Mouth: Mucous membranes are moist.     Pharynx: Oropharynx is clear.  Eyes:     General: No scleral icterus.    Conjunctiva/sclera: Conjunctivae normal.      Pupils: Pupils are equal, round, and reactive to light.  Neck:     Musculoskeletal: Normal range of motion and neck supple. No neck rigidity.  Cardiovascular:     Rate and Rhythm: Normal rate.  Pulmonary:     Effort: Pulmonary effort is normal. No respiratory distress.  Musculoskeletal:     Comments: No obvious bony deformity or scoliosis of C-spine.  No spinous process tenderness.  TTP of bilateral paraspinal muscles, superior aspects of trapezius.  Limited ROM of C-spine in all directions. Bilateral shoulders without bony deformity, tenderness.  Decreased shoulder abduction, flexion second to pain.  Grip strength 5/5.  NVI  Lymphadenopathy:     Cervical: No cervical adenopathy.  Neurological:     General: No focal deficit present.     Mental Status: She is alert.     Cranial Nerves: No cranial nerve deficit.     Gait: Gait normal.     Deep Tendon Reflexes: Reflexes normal.      UC Treatments / Results  Labs (all labs ordered are listed, but only abnormal results are displayed) Labs Reviewed - No data to display  EKG   Radiology Dg Cervical Spine Complete  Result Date: 07/10/2019 CLINICAL DATA:  Bilateral shoulder pain for 2 weeks. EXAM: CERVICAL SPINE - COMPLETE 4+ VIEW COMPARISON:  In extends to the fingers on both sides.  Neck pain. FINDINGS: The cervical spine is visualized from the skull base through the cervicothoracic junction. The prevertebral soft tissues are within normal limits. Uncovertebral spurring leads  to left greater than right foraminal narrowing, particular at C5-6 and C6-7. There is uncovertebral spurring on both sides at these levels. No acute or healing fractures are present. The lung apices are clear. IMPRESSION: 1. No acute abnormality. 2. Degenerative changes of the cervical spine including uncovertebral spurring is greatest at C5-6 and C6-7. Electronically Signed   By: San Morelle M.D.   On: 07/10/2019 10:37    Procedures Procedures (including  critical care time)  Medications Ordered in UC Medications - No data to display  Initial Impression / Assessment and Plan / UC Course  I have reviewed the triage vital signs and the nursing notes.  Pertinent labs & imaging results that were available during my care of the patient were reviewed by me and considered in my medical decision making (see chart for details).     1.  Cervical DDD X-ray of C-spine obtained in office, reviewed by me radiology: No acute abnormality, there are degenerative changes of C-spine including uncovertebral spurring (greatest at C5-6, C6-7) was noted.  Reviewed findings with patient and her mother who verbalized understanding.  Will start prednisone today and have her follow-up with Ortho for further evaluation/management.  Return precautions discussed, patient verbalized understanding and is agreeable to plan. Final Clinical Impressions(s) / UC Diagnoses   Final diagnoses:  DDD (degenerative disc disease), cervical     Discharge Instructions     Take prednisone as prescribed. Go to ER for loss of sensation in either hands. Important follow-up with specialists as you may need other evaluation and management.    ED Prescriptions    Medication Sig Dispense Auth. Provider   predniSONE (STERAPRED UNI-PAK 21 TAB) 10 MG (21) TBPK tablet Take by mouth daily. Take steroid taper as written 21 tablet [name], [name], PA-C     PDMP not reviewed this encounter.   [name], [name], [place] 07/11/19 1630

## 2019-07-10 NOTE — Discharge Instructions (Addendum)
Take prednisone as prescribed. Go to ER for loss of sensation in either hands. Important follow-up with specialists as you may need other evaluation and management.

## 2019-07-11 ENCOUNTER — Telehealth: Payer: Self-pay

## 2019-08-21 ENCOUNTER — Ambulatory Visit (INDEPENDENT_AMBULATORY_CARE_PROVIDER_SITE_OTHER): Payer: Medicare Other

## 2019-08-21 ENCOUNTER — Ambulatory Visit: Payer: Self-pay

## 2019-08-21 ENCOUNTER — Other Ambulatory Visit: Payer: Self-pay

## 2019-08-21 ENCOUNTER — Encounter: Payer: Self-pay | Admitting: Orthopedic Surgery

## 2019-08-21 ENCOUNTER — Ambulatory Visit (INDEPENDENT_AMBULATORY_CARE_PROVIDER_SITE_OTHER): Payer: Medicare Other | Admitting: Orthopedic Surgery

## 2019-08-21 VITALS — Ht 63.0 in | Wt 238.0 lb

## 2019-08-21 DIAGNOSIS — M7502 Adhesive capsulitis of left shoulder: Secondary | ICD-10-CM

## 2019-08-21 DIAGNOSIS — M25511 Pain in right shoulder: Secondary | ICD-10-CM

## 2019-08-21 DIAGNOSIS — M7501 Adhesive capsulitis of right shoulder: Secondary | ICD-10-CM

## 2019-08-21 DIAGNOSIS — M25512 Pain in left shoulder: Secondary | ICD-10-CM

## 2019-08-23 ENCOUNTER — Encounter: Payer: Self-pay | Admitting: Orthopedic Surgery

## 2019-08-23 DIAGNOSIS — M7502 Adhesive capsulitis of left shoulder: Secondary | ICD-10-CM | POA: Diagnosis not present

## 2019-08-23 DIAGNOSIS — M7501 Adhesive capsulitis of right shoulder: Secondary | ICD-10-CM

## 2019-08-23 MED ORDER — METHYLPREDNISOLONE ACETATE 40 MG/ML IJ SUSP
40.0000 mg | INTRAMUSCULAR | Status: AC | PRN
  Administered 2019-08-23: 07:00:00 40 mg via INTRA_ARTICULAR

## 2019-08-23 MED ORDER — BUPIVACAINE HCL 0.5 % IJ SOLN
9.0000 mL | INTRAMUSCULAR | Status: AC | PRN
  Administered 2019-08-23: 07:00:00 9 mL via INTRA_ARTICULAR

## 2019-08-23 MED ORDER — LIDOCAINE HCL 1 % IJ SOLN
5.0000 mL | INTRAMUSCULAR | Status: AC | PRN
  Administered 2019-08-23: 5 mL

## 2019-08-23 NOTE — Progress Notes (Signed)
Office Visit Note   Patient: Teresa Lawrence           Date of Birth: Jun 29, 1966           MRN: [id number] Visit Date: 08/21/2019 Requested by: Teresa Lawrence, [place],  [place] [address] PCP: Teresa Roers, MD  Subjective: Chief Complaint  Patient presents with  . Right Shoulder - Pain  . Left Shoulder - Pain    HPI: Teresa Lawrence is a patient with 4-week history of bilateral shoulder pain right worse than left.  Denies any history of injury.  Patient states she has pain throughout the shoulders and its worse with movement.  Occasional radiates to the elbows.  She reports pain and stiffness.  But denies numbness and tingling.  Hard for her to pick up a cup of water.  She has been taking extra strength Tylenol.  She went to urgent care 4 weeks ago and had radiographs done of her neck which show degenerative disc disease of her neck.  Prednisone was given without much relief.  She is not diabetic.              ROS: All systems reviewed are negative as they relate to the chief complaint within the history of present illness.  Patient denies  fevers or chills.   Assessment & Plan: Visit Diagnoses:  1. Acute pain of both shoulders     Plan: Impression is bilateral shoulder stiffness with normal radiographs and fairly reasonable rotator cuff strength without much grinding or coarseness on passive range of motion.  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  Motor or sensory function to the hand is intact.  Want to do bilateral shoulder intra-articular injections today with a home exercise program.  The home exercise program as per patient request.  6-week return and we could consider another injection then with formal physical therapy.  Manipulation rotator interval release will be our last step.  Follow-Up Instructions: Return in about 6 weeks (around 10/02/2019).    Orders:  Orders Placed This Encounter  Procedures  . XR Shoulder Right  . XR Shoulder Left   No orders of the defined types were placed in this encounter.     Procedures: Large Joint Inj: bilateral glenohumeral on 08/23/2019 7:06 AM Indications: diagnostic evaluation and pain Details: 18 G 1.5 in needle, posterior approach  Arthrogram: No  Medications (Right): 5 mL lidocaine 1 %; 9 mL bupivacaine 0.5 %; 40 mg methylPREDNISolone acetate 40 MG/ML Medications (Left): 5 mL lidocaine 1 %; 9 mL bupivacaine 0.5 %; 40 mg methylPREDNISolone acetate 40 MG/ML Outcome: tolerated well, no immediate complications Procedure, treatment alternatives, risks and benefits explained, specific risks discussed. Consent was given by the patient. Immediately prior to procedure a time out was called to verify the correct patient, procedure, equipment, support staff and site/side marked as required. Patient was prepped and draped in the usual sterile fashion.       Clinical Data: No additional findings.  Objective: Vital Signs: Ht 5\' 3"  (1.6 m)   Wt 238 lb (108 kg)   LMP 05/11/2013   BMI 42.16 kg/m   Physical Exam:   Constitutional: Patient appears well-developed HEENT:  Head: Normocephalic Eyes:EOM are normal Neck: Normal range of motion Cardiovascular: Normal rate Pulmonary/chest: Effort normal Neurologic: Patient is alert Skin: Skin is warm Psychiatric: Patient has normal mood and affect  Ortho Exam: Ortho exam demonstrates fairly reasonable shoulder range of motion with some restriction of rotation to about 30 degrees bilaterally.  Patient has no paresthesias C5-T1 bilaterally.  Radial pulse intact bilaterally.  Patient is 5 out of 5 grip EPL FPL interosseous wrist flexion extension bicep triceps and deltoid strength.  She does have restricted range of motion external rotation on the right about 20 degrees on the left is about 30.  Forward flexion and abduction both below 90 degrees  with abduction being most affected.  Forward flexion she can probably get a little bit past 90 using her scapula.  No AC joint tenderness bilaterally.  Reflexes symmetric bilateral biceps triceps.  Specialty Comments:  No specialty comments available.  Imaging: No results found.   PMFS History: Patient Active Problem List   Diagnosis Date Noted  . Nondisplaced fracture of lateral malleolus of left fibula, initial encounter for closed fracture 10/25/2017  . Common migraine with intractable migraine 05/14/2016  . Insomnia 03/17/2013  . Headache 02/23/2013  . Hx of yeast infection 04/20/2012  . Hx of menorrhagia 04/20/2012  . 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 Bipolar disorder ([place]) 04/20/2012  . Hx of menorrhagia 04/20/2012  . Hx of ovarian cyst 04/20/2012  . Epilepsy ([place]) 04/20/2012  . Hx of seizure disorder 04/20/2012  . 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 Hx of dysmenorrhea 04/20/2012  . 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 Hx of hypercholesterolemia 04/20/2012  . Hx of PMS (premenstrual syndrome) 04/20/2012  . Encounter for therapeutic drug monitoring 05/01/2011   Past Medical History:  Diagnosis Date  . Abnormal Pap smear   . Anxiety   . Bipolar disorder ([place])   . Dysmenorrhea   . Dysplasia   . Dysrhythmia    episodes of palpitations  . Dysuria   . H/O head injury   . Headache(784.0)   . High blood cholesterol   . HSV-2 infection   . Hx of hypercholesterolemia   . Hx of ovarian cyst   . Hx of seizure disorder   . Mass of ovary    left side  . Menorrhagia   . Mental retardation    mild per Dr[facility] in [facility]  . Mild mental retardation   . Perimenopausal vasomotor symptoms   . PMS (premenstrual syndrome)   . PMS (premenstrual syndrome)   . PMS (premenstrual syndrome)   . RLS (restless legs syndrome)   . Seizures ([place])   . Uterine mass   . Vaginal yeast infection 2007   recurrent   . Yeast vaginitis     Family History  Problem  Relation Age of Onset  . Heart attack Father   . Breast cancer Sister     Past Surgical History:  Procedure Laterality Date  . ABDOMINAL HYSTERECTOMY  2013  . COLPOSCOPY    . CYSTOSCOPY Bilateral 05/30/2013   Procedure: CYSTOSCOPY;  Surgeon: Betsy Coder, MD;  Location: [facility];  Service: Gynecology;  Laterality: Bilateral;  . DILATION AND CURETTAGE OF UTERUS    . ENDOMETRIAL BIOPSY    . HYSTEROSCOPY W/ ENDOMETRIAL ABLATION    . LAPAROSCOPIC HYSTERECTOMY Right 05/30/2013   Procedure: HYSTERECTOMY TOTAL LAPAROSCOPIC;  Surgeon: Betsy Coder, MD;  Location: [facility];  Service: Gynecology;  Laterality: Right;  Right Salpingectomy  . POLYPECTOMY    . WISDOM TOOTH EXTRACTION     Social History   Occupational History  . Occupation: Unemployed  Tobacco Use  . Smoking status: Former Research scientist (life sciences)  .  Smokeless tobacco: Never Used  . Tobacco comment: Quit 10 years ago.  Substance and Sexual Activity  . Alcohol use: No    Alcohol/week: 0.0 standard drinks  . Drug use: No  . Sexual activity: Not on file

## 2019-08-28 ENCOUNTER — Telehealth: Payer: Self-pay | Admitting: Orthopedic Surgery

## 2019-08-28 NOTE — Telephone Encounter (Signed)
See below

## 2019-08-28 NOTE — Telephone Encounter (Signed)
Patient's mother called to let Dr Marlou Sa know that the injections did help but still not 100%.  CB#[phone].  Thank you.

## 2019-08-29 NOTE — Telephone Encounter (Signed)
As we discussed in the clinic visit the injections are not designed to cure this problem.  They are only designed to diminish the pain enough for her to start working on achieving a better range of motion and stretching the shoulders out.  I would say at this point if she is better she should go to physical therapy for more formal stretching.  That would be my recommendation.  If not then she should come back 6 weeks after her prior visit so we can consider repeat injection and formal physical therapy at that time.

## 2019-08-29 NOTE — Telephone Encounter (Signed)
Spoke with patient. She will discuss possibly going to PT with her mom and they will let us know.

## 2019-10-02 ENCOUNTER — Ambulatory Visit: Payer: Medicare Other | Admitting: Orthopedic Surgery

## 2019-10-10 ENCOUNTER — Ambulatory Visit
Admission: EM | Admit: 2019-10-10 | Discharge: 2019-10-10 | Disposition: A | Discharge status: Home / Self Care | Payer: Medicare Other | Attending: Physician Assistant | Admitting: Physician Assistant

## 2019-10-10 DIAGNOSIS — K5900 Constipation, unspecified: Secondary | ICD-10-CM | POA: Diagnosis present

## 2019-10-10 DIAGNOSIS — R399 Unspecified symptoms and signs involving the genitourinary system: Secondary | ICD-10-CM

## 2019-10-10 LAB — POCT URINALYSIS DIP (MANUAL ENTRY)
Bilirubin, UA: NEGATIVE
Glucose, UA: NEGATIVE mg/dL
Ketones, POC UA: NEGATIVE mg/dL
Leukocytes, UA: NEGATIVE
Nitrite, UA: NEGATIVE
Protein Ur, POC: NEGATIVE mg/dL
Spec Grav, UA: >=1.03 — AB (ref 1.010–1.025)
Urobilinogen, UA: 0.2 E.U./dL
pH, UA: 6 (ref 5.0–8.0)

## 2019-10-10 MED ORDER — CEPHALEXIN 500 MG PO CAPS: 500.0000 mg | ORAL_CAPSULE | Freq: Two times a day (BID) | ORAL | 0 refills | Status: DC

## 2019-10-10 MED ORDER — POLYETHYLENE GLYCOL 3350 17 G PO PACK: 17.0000 g | PACK | Freq: Every day | ORAL | 0 refills | Status: DC

## 2019-10-10 NOTE — Discharge Instructions (Signed)
As discussed, given current urine does not show any bacteria, I will send for urine culture. Given it is the holidays, if urine culture does not return in time, and symptoms continue/or worsen, can fill keflex to cover for infection. Start miralax as directed. Keep hydrated, urine should be clear to pale yellow in color.  Monitor for any worsening of symptoms, fever, worsening abdominal pain, nausea/vomiting, flank pain, follow up for reevaluation.

## 2019-10-10 NOTE — ED Triage Notes (Signed)
Pt c/o urinary frequency and not emptying her bladder with burning on urination x1wk

## 2019-10-10 NOTE — ED Provider Notes (Signed)
[name]    CSN: [id number] Arrival date & time: 10/10/19  1435      History   Chief Complaint Chief Complaint  Patient presents with  . Urinary Tract Infection    HPI Teresa Lawrence is a 53 y.o. female.   53 year old female comes in for 1 week history of urinary symptoms. Has had frequency, dysuria. Has felt incomplete voiding. Denies hematuria. Denies fever, chills, body aches. Denies abdominal pain, nausea, vomiting. Denies vaginal discharge, itching, spotting. Has had some constipation as well. S/p hysterectomy.      Past Medical History:  Diagnosis Date  . Abnormal Pap smear   . Anxiety   . Bipolar disorder ([place])   . Dysmenorrhea   . Dysplasia   . Dysrhythmia    episodes of palpitations  . Dysuria   . H/O head injury   . Headache(784.0)   . High blood cholesterol   . HSV-2 infection   . Hx of hypercholesterolemia   . Hx of ovarian cyst   . Hx of seizure disorder   . Mass of ovary    left side  . Menorrhagia   . Mental retardation    mild per Dr. notes in [facility]  . Mild mental retardation   . Perimenopausal vasomotor symptoms   . PMS (premenstrual syndrome)   . PMS (premenstrual syndrome)   . PMS (premenstrual syndrome)   . RLS (restless legs syndrome)   . Seizures ([place])   . Uterine mass   . Vaginal yeast infection 2007   recurrent   . Yeast vaginitis     Patient Active Problem List   Diagnosis Date Noted  . Nondisplaced fracture of lateral malleolus of left fibula, initial encounter for closed fracture 10/25/2017  . Common migraine with intractable migraine 05/14/2016  . Insomnia 03/17/2013  . Headache 02/23/2013  . Hx of yeast infection 04/20/2012  . Hx of menorrhagia 04/20/2012  . 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 Bipolar disorder ([place]) 04/20/2012  . Hx of menorrhagia 04/20/2012  . Hx of ovarian cyst 04/20/2012  . Epilepsy ([place]) 04/20/2012  . Hx of seizure disorder 04/20/2012  . Hx of Dysplasia  04/20/2012  . Hx of dysmenorrhea 04/20/2012  . 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 Hx of hypercholesterolemia 04/20/2012  . Hx of PMS (premenstrual syndrome) 04/20/2012  . Encounter for therapeutic drug monitoring 05/01/2011    Past Surgical History:  Procedure Laterality Date  . ABDOMINAL HYSTERECTOMY  2013  . COLPOSCOPY    . CYSTOSCOPY Bilateral 05/30/2013   Procedure: CYSTOSCOPY;  Surgeon: Betsy Coder, MD;  Location: [facility];  Service: Gynecology;  Laterality: Bilateral;  . DILATION AND CURETTAGE OF UTERUS    . ENDOMETRIAL BIOPSY    . HYSTEROSCOPY W/ ENDOMETRIAL ABLATION    . LAPAROSCOPIC HYSTERECTOMY Right 05/30/2013   Procedure: HYSTERECTOMY TOTAL LAPAROSCOPIC;  Surgeon: Betsy Coder, MD;  Location: [facility];  Service: Gynecology;  Laterality: Right;  Right Salpingectomy  . POLYPECTOMY    . WISDOM TOOTH EXTRACTION      OB History   No obstetric history on file.      Home Medications    Prior to Admission medications   Medication Sig Start Date End Date Taking? Authorizing Provider  acyclovir (ZOVIRAX) 200 MG capsule Take 200 mg by mouth 3 (three) times daily as needed (fever blisters).     [provider]  atorvastatin (LIPITOR) 20 MG tablet Take 1 tablet (20 mg total) by  mouth daily. 08/04/17   [name], MD  Boric Acid POWD Place 1 capsule vaginally See admin instructions. As directed for yeast infections    [provider]  Cariprazine HCl (VRAYLAR PO) Take by mouth.    [provider]  cephALEXin (KEFLEX) 500 MG capsule Take 1 capsule (500 mg total) by mouth 2 (two) times daily. 10/10/19   [name], Sherlynn [place] V, PA-C  clonazePAM (KLONOPIN) 2 MG tablet Take 2 mg by mouth in the morning then 2 mg in the afternoon then 4 mg at bedtime 05/06/19   [name], [name], MD  diphenhydrAMINE (BENADRYL) 25 MG tablet Take 25 mg by mouth every 6 (six) hours as needed for allergies.    [provider]  furosemide (LASIX) 20 MG tablet Take 1  tablet (20 mg total) by mouth daily for 5 days. 12/05/18 12/10/18  Wieters, Hallie C, PA-C  gabapentin (NEURONTIN) 100 MG capsule TAKE 1 CAPSULE BY MOUTH TWO TIMES DAILY 05/18/19   [name], NP  hydrocortisone cream 1 % Apply 1 application topically as needed (itching).    [provider]  ibuprofen (ADVIL,MOTRIN) 600 MG tablet Take 1 tablet (600 mg total) by mouth every 6 (six) hours as needed (mild pain). 05/31/13   Earnstine Regal, PA-C  loperamide (IMODIUM) 2 MG capsule Take 2 mg by mouth 4 (four) times daily as needed for diarrhea or loose stools.    [provider]  magic mouthwash SOLN 15 mLs by Mouth Rinse route 2 (two) times daily as needed for mouth pain.  11/08/17   [provider]  meloxicam (MOBIC) 7.5 MG tablet Take 1 tablet (7.5 mg total) by mouth daily. 04/25/19   [name], Adryanna Friedt V, PA-C  mupirocin cream (BACTROBAN) 2 % Apply 1 application topically 2 (two) times daily. 04/20/18   Patrecia Pour, MD  pantoprazole (PROTONIX) 40 MG tablet Take 40 mg by mouth 2 (two) times daily as needed (for reflux symptoms).     [provider]  PHENobarbital (LUMINAL) 64.8 MG tablet Take 1 tablet (64.8 mg total) by mouth 2 (two) times daily. 05/10/19   [name], MD  polyethylene glycol (MIRALAX) 17 g packet Take 17 g by mouth daily. 10/10/19   [name], Skanda Worlds V, PA-C  pramipexole (MIRAPEX) 0.25 MG tablet TAKE 1 TABLET BY MOUTH TWICE DAILY AND 2 TABLETS AT BEDTIME 06/21/19   [name], NP  predniSONE (STERAPRED UNI-PAK 21 TAB) 10 MG (21) TBPK tablet Take by mouth daily. Take steroid taper as written 07/10/19   [name], [name], PA-C  promethazine (PHENERGAN) 25 MG tablet Take 25 mg by mouth every 6 (six) hours as needed for nausea or vomiting.    [provider]  QUEtiapine (SEROQUEL) 100 MG tablet TAKE 1 & 1/2 (ONE & ONE-HALF) TABLETS BY MOUTH AT BEDTIME 08/22/18   Dennie Bible, NP  Suvorexant (BELSOMRA) 20 MG TABS Take 1 tablet by mouth at bedtime as  needed. 05/11/19   [name], MD    Family History Family History  Problem Relation Age of Onset  . Heart attack Father   . Breast cancer Sister     Social History Social History   Tobacco Use  . Smoking status: Former Research scientist (life sciences)  . Smokeless tobacco: Never Used  . Tobacco comment: Quit 10 years ago.  Substance Use Topics  . Alcohol use: No    Alcohol/week: 0.0 standard drinks  . Drug use: No     Allergies   Requip [  ropinirole hcl], Topamax [topiramate], and Zonegran [zonisamide]   Review of Systems Review of Systems  Reason unable to perform ROS: See HPI as above.     Physical Exam Triage Vital Signs ED Triage Vitals  Enc Vitals Group     BP 10/10/19 1510 (!) 151/88     Pulse Rate 10/10/19 1510 (!) 113     Resp 10/10/19 1510 (!) 22     Temp 10/10/19 1510 98.4 F (36.9 C)     Temp Source 10/10/19 1510 Oral     SpO2 10/10/19 1510 90 %     Weight --      Height --      Head Circumference --      Peak Flow --      Pain Score 10/10/19 1523 5     Pain Loc --      Pain Edu? --      Excl. in [place]? --    No data found.  Updated Vital Signs BP (!) 151/88 (BP Location: Right Arm)   Pulse (!) 104   Temp 98.4 F (36.9 C) (Oral)   Resp (!) 22   LMP 05/11/2013   SpO2 95%   Physical Exam Constitutional:      General: She is not in acute distress.    Appearance: She is well-developed. She is not ill-appearing, toxic-appearing or diaphoretic.  HENT:     Head: Normocephalic and atraumatic.  Eyes:     Conjunctiva/sclera: Conjunctivae normal.     Pupils: Pupils are equal, round, and reactive to light.  Cardiovascular:     Rate and Rhythm: Normal rate and regular rhythm.     Heart sounds: Normal heart sounds. No murmur. No friction rub. No gallop.   Pulmonary:     Effort: Pulmonary effort is normal.     Breath sounds: Normal breath sounds. No wheezing or rales.  Abdominal:     General: Bowel sounds are normal.     Palpations: Abdomen is soft.      Tenderness: There is no abdominal tenderness. There is no right CVA tenderness, left CVA tenderness, guarding or rebound.  Skin:    General: Skin is warm and dry.  Neurological:     Mental Status: She is alert and oriented to person, place, and time.  Psychiatric:        Behavior: Behavior normal.        Judgment: Judgment normal.    UC Treatments / Results  Labs (all labs ordered are listed, but only abnormal results are displayed) Labs Reviewed  POCT URINALYSIS DIP (MANUAL ENTRY) - Abnormal; Notable for the following components:      Result Value   Spec Grav, UA >=1.030 (*)    Blood, UA large (*)    All other components within normal limits  URINE CULTURE    EKG   Radiology No results found.  Procedures Procedures (including critical care time)  Medications Ordered in UC Medications - No data to display  Initial Impression / Assessment and Plan / UC Course  I have reviewed the triage vital signs and the nursing notes.  Pertinent labs & imaging results that were available during my care of the patient were reviewed by me and considered in my medical decision making (see chart for details).     Dipstick with large blood, no leuks/nitrate. Will send for urine culture. Will have patient treat for constipation first with miralax. Push fluids. Rx of keflex sent to pharmacy given holiday season, if symptoms  worsen, does not improve after constipation resolves, can start keflex to cover for UTI. Return precautions given. Patient expresses understanding and agrees to plan.  Final Clinical Impressions(s) / UC Diagnoses   Final diagnoses:  Urinary tract infection symptoms  Constipation, unspecified constipation type   ED Prescriptions    Medication Sig Dispense Auth. Provider   polyethylene glycol (MIRALAX) 17 g packet Take 17 g by mouth daily. 14 each Kateland Leisinger V, PA-C   cephALEXin (KEFLEX) 500 MG capsule Take 1 capsule (500 mg total) by mouth 2 (two) times daily. 10 capsule  Ok Edwards, PA-C     PDMP not reviewed this encounter.   Ok Edwards, PA-C 10/10/19 [phone]

## 2019-10-13 LAB — URINE CULTURE: Culture: 40000 — AB

## 2019-10-22 ENCOUNTER — Other Ambulatory Visit: Payer: Self-pay | Admitting: Neurology

## 2019-10-23 NOTE — Telephone Encounter (Signed)
Pending appt feb 2021.  Per [place] registry, last fill 09/23/2019 ordered 05/11/2019 Belsomra 20 Mg Tablet  #30.00 30 day supply, Dr. Jannifer Franklin prescribed. Will send to work-in physician to authorize as Dr. Jannifer Franklin is out of the office.

## 2019-10-24 ENCOUNTER — Other Ambulatory Visit: Payer: Self-pay | Admitting: Neurology

## 2019-10-24 ENCOUNTER — Other Ambulatory Visit: Payer: Self-pay

## 2019-10-24 MED ORDER — BELSOMRA 20 MG PO TABS: 1.0000 | ORAL_TABLET | Freq: Every evening | ORAL | 3 refills | Status: DC | PRN

## 2019-10-24 NOTE — Telephone Encounter (Signed)
[place] Database Verified LR: 09-23-2019 Qty: 30 Pending appointment: 11-23-2019 Judson Roch)

## 2019-11-01 ENCOUNTER — Ambulatory Visit (INDEPENDENT_AMBULATORY_CARE_PROVIDER_SITE_OTHER): Payer: Medicare Other | Admitting: Orthopedic Surgery

## 2019-11-01 ENCOUNTER — Other Ambulatory Visit: Payer: Self-pay

## 2019-11-01 DIAGNOSIS — M7502 Adhesive capsulitis of left shoulder: Secondary | ICD-10-CM | POA: Diagnosis not present

## 2019-11-01 DIAGNOSIS — M7501 Adhesive capsulitis of right shoulder: Secondary | ICD-10-CM

## 2019-11-03 ENCOUNTER — Encounter: Payer: Self-pay | Admitting: Orthopedic Surgery

## 2019-11-03 NOTE — Progress Notes (Signed)
Office Visit Note   Patient: Teresa Lawrence           Date of Birth: Feb 23, 1966           MRN: [id number] Visit Date: 11/01/2019 Requested by: Tamsen Roers, [place],  [place] [address] PCP: Tamsen Roers, MD  Subjective: Chief Complaint  Patient presents with  . Right Shoulder - Follow-up  . Left Shoulder - Follow-up    HPI: Teresa Lawrence is a 54 y.o. female who presents to the office complaining of bilateral shoulder pain.  Patient returns to the office following bilateral shoulder injections on 08/21/2019.  She was diagnosed with bilateral frozen shoulder.  She notes that injections provided relief for about a month from pain.  She states that range of motion is improving but still significant limited, especially on the right side.  She has been doing home exercises since her last office visit and she notes that she does them about 3-4 times per week.  She was recently seen by a rheumatologist who is placing patient on a oral prednisone course for 12 days starting today.  Labs were drawn at this visit and they are waiting on results for potential rheumatoid arthritis..                ROS:  All systems reviewed are negative as they relate to the chief complaint within the history of present illness.  Patient denies fevers or chills.  Assessment & Plan: Visit Diagnoses:  1. Bilateral adhesive capsulitis of shoulders     Plan: Patient is a 54 year old female who returns to the office following bilateral shoulder injections for bilateral frozen shoulder on 08/21/2019.  Her range of motion is still significantly limited especially on the right side.  She notes that she has been making some improvements but compared with her last office visit these improvements seem relatively minor.  Discussed options available to patient.  Normally we would try another cortisone injection into the joint but with patient's upcoming oral steroid course, we will hold off for now.  Referred patient  to formal physical therapy for passive range of motion of bilateral shoulders.  The oral steroid course should help with the bilateral shoulder inflammatory processes.  Plan for patient to return in 4 weeks and we will consider surgical manipulation at that point if there is little to no improvement.  Patient and mother agree with plan.  Follow-Up Instructions: No follow-ups on file.   Orders:  Orders Placed This Encounter  Procedures  . Ambulatory referral to Physical Therapy   No orders of the defined types were placed in this encounter.     Procedures: No procedures performed   Clinical Data: No additional findings.  Objective: Vital Signs: LMP 05/11/2013   Physical Exam:  Constitutional: Patient appears well-developed HEENT:  Head: Normocephalic Eyes:EOM are normal Neck: Normal range of motion Cardiovascular: Normal rate Pulmonary/chest: Effort normal Neurologic: Patient is alert Skin: Skin is warm Psychiatric: Patient has normal mood and affect  Ortho Exam:  Bilateral shoulder Exam Moderately limited forward flexion, abduction, external rotation of the left shoulder.  Significantly, severely limited abduction, forward flexion, external rotation of the right shoulder. Good subscapularis, supraspinatus, and infraspinatus strength 5/5 grip strength, forearm pronation/supination, and bicep strength  Specialty Comments:  No specialty comments available.  Imaging: No results found.   PMFS History: Patient Active Problem List   Diagnosis Date Noted  . Nondisplaced fracture of lateral malleolus of left fibula,  initial encounter for closed fracture 10/25/2017  . Common migraine with intractable migraine 05/14/2016  . Insomnia 03/17/2013  . Headache 02/23/2013  . Hx of yeast infection 04/20/2012  . Hx of menorrhagia 04/20/2012  . 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 Bipolar disorder ([place]) 04/20/2012  . Hx of menorrhagia 04/20/2012  . Hx of  ovarian cyst 04/20/2012  . Epilepsy ([place]) 04/20/2012  . Hx of seizure disorder 04/20/2012  . 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 Hx of dysmenorrhea 04/20/2012  . 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 Hx of hypercholesterolemia 04/20/2012  . Hx of PMS (premenstrual syndrome) 04/20/2012  . Encounter for therapeutic drug monitoring 05/01/2011   Past Medical History:  Diagnosis Date  . Abnormal Pap smear   . Anxiety   . Bipolar disorder ([place])   . Dysmenorrhea   . Dysplasia   . Dysrhythmia    episodes of palpitations  . Dysuria   . H/O head injury   . Headache(784.0)   . High blood cholesterol   . HSV-2 infection   . Hx of hypercholesterolemia   . Hx of ovarian cyst   . Hx of seizure disorder   . Mass of ovary    left side  . Menorrhagia   . Mental retardation    mild per Dr[facility] in [facility]  . Mild mental retardation   . Perimenopausal vasomotor symptoms   . PMS (premenstrual syndrome)   . PMS (premenstrual syndrome)   . PMS (premenstrual syndrome)   . RLS (restless legs syndrome)   . Seizures ([place])   . Uterine mass   . Vaginal yeast infection 2007   recurrent   . Yeast vaginitis     Family History  Problem Relation Age of Onset  . Heart attack Father   . Breast cancer Sister     Past Surgical History:  Procedure Laterality Date  . ABDOMINAL HYSTERECTOMY  2013  . COLPOSCOPY    . CYSTOSCOPY Bilateral 05/30/2013   Procedure: CYSTOSCOPY;  Surgeon: Betsy Coder, MD;  Location: [facility];  Service: Gynecology;  Laterality: Bilateral;  . DILATION AND CURETTAGE OF UTERUS    . ENDOMETRIAL BIOPSY    . HYSTEROSCOPY W/ ENDOMETRIAL ABLATION    . LAPAROSCOPIC HYSTERECTOMY Right 05/30/2013   Procedure: HYSTERECTOMY TOTAL LAPAROSCOPIC;  Surgeon: Betsy Coder, MD;  Location: [facility];  Service: Gynecology;  Laterality: Right;  Right Salpingectomy  . POLYPECTOMY    . WISDOM TOOTH EXTRACTION     Social History   Occupational History  . Occupation: Unemployed  Tobacco Use  .  Smoking status: Former Research scientist (life sciences)  . Smokeless tobacco: Never Used  . Tobacco comment: Quit 10 years ago.  Substance and Sexual Activity  . Alcohol use: No    Alcohol/week: 0.0 standard drinks  . Drug use: No  . Sexual activity: Not on file

## 2019-11-05 ENCOUNTER — Encounter: Payer: Self-pay | Admitting: Orthopedic Surgery

## 2019-11-11 ENCOUNTER — Other Ambulatory Visit: Payer: Self-pay | Admitting: Diagnostic Neuroimaging

## 2019-11-15 ENCOUNTER — Ambulatory Visit: Payer: Medicare Other | Attending: Surgical | Admitting: Physical Therapy

## 2019-11-15 ENCOUNTER — Other Ambulatory Visit: Payer: Self-pay

## 2019-11-15 DIAGNOSIS — M25511 Pain in right shoulder: Secondary | ICD-10-CM | POA: Insufficient documentation

## 2019-11-15 DIAGNOSIS — M25612 Stiffness of left shoulder, not elsewhere classified: Secondary | ICD-10-CM | POA: Diagnosis not present

## 2019-11-15 DIAGNOSIS — M6281 Muscle weakness (generalized): Secondary | ICD-10-CM | POA: Diagnosis present

## 2019-11-15 DIAGNOSIS — G8929 Other chronic pain: Secondary | ICD-10-CM | POA: Diagnosis present

## 2019-11-15 DIAGNOSIS — M25512 Pain in left shoulder: Secondary | ICD-10-CM | POA: Insufficient documentation

## 2019-11-15 DIAGNOSIS — M25611 Stiffness of right shoulder, not elsewhere classified: Secondary | ICD-10-CM | POA: Diagnosis present

## 2019-11-15 NOTE — Therapy (Signed)
[place], [place], [address] Phone: [phone]   Fax:  [phone]  Physical Therapy Evaluation  Patient Details  Name: Teresa Lawrence MRN: [id number] Date of Birth: 1965-12-27 Referring Provider (PT): Dr. Marlou Sa    Encounter Date: 11/15/2019  PT End of Session - 11/15/19 1324    Visit Number  1    Number of Visits  12    Date for PT Re-Evaluation  12/27/19    Authorization Type  MCR/MCD    Authorization Time Period  10th visit PN    PT Start Time  1045    PT Stop Time  1130    PT Time Calculation (min)  45 min    Activity Tolerance  Patient tolerated treatment well    Behavior During Therapy  [facility] for tasks assessed/performed       Past Medical History:  Diagnosis Date  . Abnormal Pap smear   . Anxiety   . Bipolar disorder ([place])   . Dysmenorrhea   . Dysplasia   . Dysrhythmia    episodes of palpitations  . Dysuria   . H/O head injury   . Headache(784.0)   . High blood cholesterol   . HSV-2 infection   . Hx of hypercholesterolemia   . Hx of ovarian cyst   . Hx of seizure disorder   . Mass of ovary    left side  . Menorrhagia   . Mental retardation    mild per Dr. notes in [facility]  . Mild mental retardation   . Perimenopausal vasomotor symptoms   . PMS (premenstrual syndrome)   . PMS (premenstrual syndrome)   . PMS (premenstrual syndrome)   . RLS (restless legs syndrome)   . Seizures ([place])   . Uterine mass   . Vaginal yeast infection 2007   recurrent   . Yeast vaginitis     Past Surgical History:  Procedure Laterality Date  . ABDOMINAL HYSTERECTOMY  2013  . COLPOSCOPY    . CYSTOSCOPY Bilateral 05/30/2013   Procedure: CYSTOSCOPY;  Surgeon: Betsy Coder, MD;  Location: [facility];  Service: Gynecology;  Laterality: Bilateral;  . DILATION AND CURETTAGE OF UTERUS    . ENDOMETRIAL BIOPSY    . HYSTEROSCOPY W/ ENDOMETRIAL ABLATION    . LAPAROSCOPIC HYSTERECTOMY Right 05/30/2013   Procedure: HYSTERECTOMY TOTAL LAPAROSCOPIC;  Surgeon: Betsy Coder, MD;  Location: [facility];  Service: Gynecology;  Laterality: Right;  Right Salpingectomy  . POLYPECTOMY    . WISDOM TOOTH EXTRACTION      There were no vitals filed for this visit.   Subjective Assessment - 11/15/19 1056    Subjective  Pt with chronic bilateral shoulder pain since before 11/20.  The injections did help her for a time. She has difficulty reaching back for ADLs, hygiene.    Pertinent History  mild mental retardation, history seizures, hand pain, stiffness    Limitations  Lifting;House hold activities    Diagnostic tests  XR done, neg.  Being worked up for RA    Patient Stated Goals  I want to get my shoulder back in motion    Currently in Pain?  Yes    Pain Score  9    none at rest   Pain Location  Shoulder    Pain Orientation  Right    Pain Descriptors / Indicators  Sharp;Shooting    Pain Type  Chronic pain    Pain Onset  More than a month ago  Pain Frequency  Intermittent    Aggravating Factors   reaching up and back    Pain Relieving Factors  resting, non movement    Multiple Pain Sites  Yes    Pain Score  1    Pain Location  Shoulder    Pain Orientation  Left    Pain Descriptors / Indicators  Aching;Sharp;Shooting    Pain Type  Chronic pain    Pain Onset  More than a month ago    Pain Frequency  Intermittent    Aggravating Factors   lifting arms , reaching back    Pain Relieving Factors  rest    Effect of Pain on Daily Activities  unable to use arms effectively for home tasks         [facility] PT Assessment - 11/15/19 0001      Assessment   Medical Diagnosis  bilateral adhesive capsulitis     Referring Provider (PT)  Dr. Marlou Sa     Onset Date/Surgical Date  --   Fall 2020   Hand Dominance  Right    Next MD Visit  pt not sure    Prior Therapy  Yes for hands       Precautions   Precautions  None;Other (comment)    Precaution Comments  seizures      Restrictions   Weight Bearing  Restrictions  No      Balance Screen   Has the patient fallen in the past 6 months  No      [place] residence    Living Arrangements  Parent    Type of [place] Access  Level entry      Prior Function   Level of Independence  Independent with basic ADLs    Leisure  sedentary, cats, lives with mom       Cognition   Overall Cognitive Status  History of cognitive impairments - at baseline      Observation/Other Assessments   Focus on Therapeutic Outcomes (FOTO)   59%      Sensation   Light Touch  Appears Intact      Posture/Postural Control   Posture/Postural Control  Postural limitations    Postural Limitations  Rounded Shoulders;Forward head      AROM   Right Shoulder Extension  30 Degrees    Right Shoulder Flexion  104 Degrees    Right Shoulder ABduction  60 Degrees    Right Shoulder Internal Rotation  70 Degrees   FR to Rt hip    Right Shoulder External Rotation  20 Degrees   FR to C7   Left Shoulder Extension  30 Degrees    Left Shoulder Flexion  116 Degrees    Left Shoulder ABduction  90 Degrees    Left Shoulder Internal Rotation  75 Degrees   FR to mid lumbar    Left Shoulder External Rotation  20 Degrees   FR to C7      Strength   Right Shoulder Flexion  3-/5    Right Shoulder ABduction  2+/5    Right Shoulder Internal Rotation  4/5    Right Shoulder External Rotation  3/5    Left Shoulder Flexion  3-/5    Left Shoulder ABduction  3-/5    Left Shoulder Internal Rotation  4+/5    Left Shoulder External Rotation  3+/5    Right Hand Grip (lbs)  8 lbs  Left Hand Grip (lbs)  9 lbs      Palpation   Palpation comment  gross pain across bilateral shoulders and anterolateral aspect R>L        Objective measurements completed on examination: See above findings.      [facility] PT Treatment/Exercise - 11/15/19 0001      Shoulder Exercises: Standing   External Rotation  Strengthening;Both;15 reps     Theraband Level (Shoulder External Rotation)  Level 1 (Yellow)    Retraction Weight (lbs)  on wall x 10       Shoulder Exercises: ROM/Strengthening   Wall Wash  flexion towel slides x 10              PT Education - 11/15/19 1324    Education Details  PT/POC, HEP, ROM    Person(s) Educated  Patient    Methods  Explanation;Demonstration;Handout;Verbal cues    Comprehension  Verbalized understanding;Returned demonstration          PT Long Term Goals - 11/15/19 1325      PT LONG TERM GOAL #1   Title  Pt will be I with HEP for bilateral shoulder ROM and strength    Time  6    Period  Weeks    Status  New    Target Date  12/27/19      PT LONG TERM GOAL #2   Title  Pt will be able to reach to her low back with min discomfort in Rt UE    Time  6    Period  Weeks    Status  New    Target Date  12/27/19      PT LONG TERM GOAL #3   Title  Pt will be able to increase shoulder strength to 4/5 bilateral in flexion, abduction    Time  6    Period  Weeks    Status  New    Target Date  12/27/19      PT LONG TERM GOAL #4   Title  Pt will be able to lift 1-2 lbs above shoulder height with no difficulty    Time  6    Period  Weeks    Status  New    Target Date  12/27/19      PT LONG TERM GOAL #5   Title  FOTO score will increase to 43% or less disability to demo improvement in functional mobility.    Time  6    Period  Weeks    Status  New    Target Date  12/27/19             Plan - 11/15/19 1332    Clinical Impression Statement  Pt presents for mod complexity eval of bilateral shoulder pain , adhesive capsulitis per Dr. Marlou Sa.  She has significant pain, limitations in AROM, PROM and strength.  She has poor posture and has not been doing exercises since the onset of her conditton.  She is also a poor historian, baseline of cognitive issues.  She should expect minimal improvement but I encouraged her to do HEP daily for maximum outcome.    Personal Factors and  Comorbidities  Behavior Pattern;Comorbidity 1;Social Background;Past/Current Experience    Comorbidities  obesity,cognition, bipolar    Examination-Activity Limitations  Bathing;Bed Mobility;Dressing;Self Feeding;Carry;Hygiene/Grooming    Examination-Participation Restrictions  Interpersonal Relationship;Cleaning    Stability/Clinical Decision Making  Evolving/Moderate complexity    Clinical Decision Making  Moderate    Rehab Potential  Good  PT Frequency  2x / week    PT Duration  6 weeks    PT Treatment/Interventions  ADLs/Self Care Home Management;Iontophoresis 4mg /ml Dexamethasone;Cryotherapy;Electrical Stimulation;Moist Heat;Ultrasound;Functional mobility training;Therapeutic activities;Therapeutic exercise;Manual techniques;Passive range of motion;Neuromuscular re-education;Taping;Patient/family education    PT Next Visit Plan  check HEP, AAROM to AROM    PT Home Exercise Plan  wall wash, scapular retraction, ER yellow band    Consulted and Agree with Plan of Care  Patient       Patient will benefit from skilled therapeutic intervention in order to improve the following deficits and impairments:  Decreased activity tolerance, Decreased range of motion, Decreased strength, Increased fascial restricitons, Impaired UE functional use, Pain, Obesity, Postural dysfunction, Impaired flexibility, Decreased mobility  Visit Diagnosis: Stiffness of left shoulder, not elsewhere classified  Stiffness of right shoulder, not elsewhere classified  Muscle weakness (generalized)  Chronic left shoulder pain  Chronic right shoulder pain     Problem List Patient Active Problem List   Diagnosis Date Noted  . Nondisplaced fracture of lateral malleolus of left fibula, initial encounter for closed fracture 10/25/2017  . Common migraine with intractable migraine 05/14/2016  . Insomnia 03/17/2013  . Headache 02/23/2013  . Hx of yeast infection 04/20/2012  . Hx of menorrhagia 04/20/2012  . HSV-2  (herpes simplex virus 2) infection 04/20/2012  . Bipolar disorder ([place]) 04/20/2012  . Hx of menorrhagia 04/20/2012  . Hx of ovarian cyst 04/20/2012  . Epilepsy ([place]) 04/20/2012  . Hx of seizure disorder 04/20/2012  . 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 Hx of dysmenorrhea 04/20/2012  . 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 Hx of hypercholesterolemia 04/20/2012  . Hx of PMS (premenstrual syndrome) 04/20/2012  . Encounter for therapeutic drug monitoring 05/01/2011    Teresa Lawrence 11/15/2019, 1:41 PM  [facility] [address] [place], [place], [address] Phone: [phone]   Fax:  [phone]  Name: Teresa Lawrence MRN: [id number] Date of Birth: 1965/12/01   Raeford Razor, PT 11/15/19 1:41 PM Phone: [phone] Fax: [phone]

## 2019-11-20 ENCOUNTER — Ambulatory Visit: Payer: Medicare Other | Admitting: Physical Therapy

## 2019-11-23 ENCOUNTER — Ambulatory Visit (INDEPENDENT_AMBULATORY_CARE_PROVIDER_SITE_OTHER): Payer: Medicare Other | Admitting: Neurology

## 2019-11-23 ENCOUNTER — Other Ambulatory Visit: Payer: Self-pay

## 2019-11-23 ENCOUNTER — Encounter: Payer: Self-pay | Admitting: Neurology

## 2019-11-23 VITALS — BP 124/86 | HR 82 | Temp 97.0°F | Ht 63.0 in | Wt 241.2 lb

## 2019-11-23 DIAGNOSIS — F319 Bipolar disorder, unspecified: Secondary | ICD-10-CM

## 2019-11-23 DIAGNOSIS — Z8669 Personal history of other diseases of the nervous system and sense organs: Secondary | ICD-10-CM

## 2019-11-23 DIAGNOSIS — R519 Headache, unspecified: Secondary | ICD-10-CM

## 2019-11-23 MED ORDER — QUETIAPINE FUMARATE 100 MG PO TABS: ORAL_TABLET | ORAL | 3 refills | Status: DC

## 2019-11-23 MED ORDER — PRAMIPEXOLE DIHYDROCHLORIDE 0.25 MG PO TABS: ORAL_TABLET | ORAL | 5 refills | Status: DC

## 2019-11-23 MED ORDER — GABAPENTIN 100 MG PO CAPS: ORAL_CAPSULE | ORAL | 1 refills | Status: DC

## 2019-11-23 NOTE — Patient Instructions (Signed)
Continue current medications, I have sent refills, we will see you back 6 months

## 2019-11-23 NOTE — Progress Notes (Signed)
PATIENT: Teresa Lawrence DOB: 07/03/1966  REASON FOR VISIT: follow up HISTORY FROM: patient  HISTORY OF PRESENT ILLNESS: Today 11/23/19  Teresa Lawrence is a 54 year old female with history of headaches, bipolar disorder, and seizure disorder.  Her last seizure occurred in January 2019.  She is taking phenobarbital 64.8 mg twice daily, clonazepam 2 mg twice daily and 2 tablets at bedtime for mood swings.  She takes gabapentin for her headaches.  She is also prescribed Belsomra for sleep, and Mirapex for restless leg syndrome.  She is also taking Seroquel at bedtime for mood swings.  She has seen psychiatry in the past, but has not seen them recently.  She has not had recurrent seizure.  In general, she sleeps well.  Her headaches have been under good control, as long as she takes gabapentin.  She is dealing with bilateral frozen shoulders, is currently in physical therapy.  She lives with her mom, and 2 cats.  She does not work or drive a car.  She presents today for evaluation unaccompanied.  HISTORY 05/18/2019 SS: Teresa Lawrence is a 54 year old female with history of headaches, bipolar disorder, and seizure disorder.  Her last seizure occurred in January 2019 .  She remains on phenobarbital 64.8 mg twice daily.  She is also taking clonazepam 2 mg twice daily and 2 tablets at bedtime for mood swings.  She takes Gabapentin for her headaches. She describes these as stabbing pain in her head that are stable and not daily.  She is under the care of psychiatry. She is taking Vraylar for her mood and it is helping. Dr. Jannifer Franklin is prescribing Belsomra for sleep, and Mirapex for restless leg syndrome. She was seen in the ER 04/25/2019 and 05/08/2019 for hand pain, 12/05/2018 for leg swelling. She lives with her mom and 2 cats. She does not work or drive a car. She is wearing brace to left hand.  She presents today for follow-up unaccompanied.   REVIEW OF SYSTEMS: Out of a complete 14 system review of symptoms, the  patient complains only of the following symptoms, and all other reviewed systems are negative.  Headache, seizures  ALLERGIES: Allergies  Allergen Reactions  . Requip [Ropinirole Hcl] Nausea And Vomiting  . Topamax [Topiramate] Nausea Only  . Zonegran [Zonisamide] Nausea Only    HOME MEDICATIONS: Outpatient Medications Prior to Visit  Medication Sig Dispense Refill  . acyclovir (ZOVIRAX) 200 MG capsule Take 200 mg by mouth 3 (three) times daily as needed (fever blisters).     Marland Kitchen atorvastatin (LIPITOR) 20 MG tablet Take 1 tablet (20 mg total) by mouth daily.    . BELSOMRA 20 MG TABS TAKE 1 TABLET BY MOUTH AT BEDTIME AS NEEDED 30 tablet 0  . Boric Acid POWD Place 1 capsule vaginally See admin instructions. As directed for yeast infections    . Cariprazine HCl (VRAYLAR PO) Take by mouth.    . clonazePAM (KLONOPIN) 2 MG tablet TAKE 1 TABLET BY MOUTH IN THE MORNING AND 1 TABLET IN THE AFTERNOON AND 2 TABLETS AT BEDTIME 120 tablet 0  . diphenhydrAMINE (BENADRYL) 25 MG tablet Take 25 mg by mouth every 6 (six) hours as needed for allergies.    Marland Kitchen gabapentin (NEURONTIN) 100 MG capsule TAKE 1 CAPSULE BY MOUTH TWO TIMES DAILY 180 capsule 1  . hydrocortisone cream 1 % Apply 1 application topically as needed (itching).    Marland Kitchen ibuprofen (ADVIL,MOTRIN) 600 MG tablet Take 1 tablet (600 mg total) by mouth every 6 (  six) hours as needed (mild pain). 30 tablet 1  . loperamide (IMODIUM) 2 MG capsule Take 2 mg by mouth 4 (four) times daily as needed for diarrhea or loose stools.    . magic mouthwash SOLN 15 mLs by Mouth Rinse route 2 (two) times daily as needed for mouth pain.   3  . meloxicam (MOBIC) 7.5 MG tablet Take 1 tablet (7.5 mg total) by mouth daily. 10 tablet 0  . pantoprazole (PROTONIX) 40 MG tablet Take 40 mg by mouth 2 (two) times daily as needed (for reflux symptoms).     Marland Kitchen PHENobarbital (LUMINAL) 64.8 MG tablet Take 1 tablet by mouth twice daily 180 tablet 1  . polyethylene glycol (MIRALAX) 17 g  packet Take 17 g by mouth daily. 14 each 0  . pramipexole (MIRAPEX) 0.25 MG tablet TAKE 1 TABLET BY MOUTH TWICE DAILY AND 2 TABLETS AT BEDTIME 120 tablet 5  . promethazine (PHENERGAN) 25 MG tablet Take 25 mg by mouth every 6 (six) hours as needed for nausea or vomiting.    Marland Kitchen QUEtiapine (SEROQUEL) 100 MG tablet TAKE 1 & 1/2 (ONE & ONE-HALF) TABLETS BY MOUTH AT BEDTIME 45 tablet 11  . Suvorexant (BELSOMRA) 20 MG TABS Take 1 tablet by mouth at bedtime as needed. 30 tablet 3  . furosemide (LASIX) 20 MG tablet Take 1 tablet (20 mg total) by mouth daily for 5 days. 5 tablet 0  . cephALEXin (KEFLEX) 500 MG capsule Take 1 capsule (500 mg total) by mouth 2 (two) times daily. (Patient not taking: Reported on 11/15/2019) 10 capsule 0  . mupirocin cream (BACTROBAN) 2 % Apply 1 application topically 2 (two) times daily. (Patient not taking: Reported on 11/15/2019) 15 g 0  . predniSONE (STERAPRED UNI-PAK 21 TAB) 10 MG (21) TBPK tablet Take by mouth daily. Take steroid taper as written (Patient not taking: Reported on 11/15/2019) 21 tablet 0   No facility-administered medications prior to visit.    PAST MEDICAL HISTORY: Past Medical History:  Diagnosis Date  . Abnormal Pap smear   . Anxiety   . Bipolar disorder ([place])   . Dysmenorrhea   . Dysplasia   . Dysrhythmia    episodes of palpitations  . Dysuria   . H/O head injury   . Headache(784.0)   . High blood cholesterol   . HSV-2 infection   . Hx of hypercholesterolemia   . Hx of ovarian cyst   . Hx of seizure disorder   . Mass of ovary    left side  . Menorrhagia   . Mental retardation    mild per Dr. notes in [facility]  . Mild mental retardation   . Perimenopausal vasomotor symptoms   . PMS (premenstrual syndrome)   . PMS (premenstrual syndrome)   . PMS (premenstrual syndrome)   . RLS (restless legs syndrome)   . Seizures ([place])   . Uterine mass   . Vaginal yeast infection 2007   recurrent   . Yeast vaginitis     PAST SURGICAL HISTORY: Past  Surgical History:  Procedure Laterality Date  . ABDOMINAL HYSTERECTOMY  2013  . COLPOSCOPY    . CYSTOSCOPY Bilateral 05/30/2013   Procedure: CYSTOSCOPY;  Surgeon: Betsy Coder, MD;  Location: [facility];  Service: Gynecology;  Laterality: Bilateral;  . DILATION AND CURETTAGE OF UTERUS    . ENDOMETRIAL BIOPSY    . HYSTEROSCOPY W/ ENDOMETRIAL ABLATION    . LAPAROSCOPIC HYSTERECTOMY Right 05/30/2013   Procedure: HYSTERECTOMY TOTAL LAPAROSCOPIC;  Surgeon:  Betsy Coder, MD;  Location: [facility];  Service: Gynecology;  Laterality: Right;  Right Salpingectomy  . POLYPECTOMY    . WISDOM TOOTH EXTRACTION      FAMILY HISTORY: Family History  Problem Relation Age of Onset  . Heart attack Father   . Breast cancer Sister     SOCIAL HISTORY: Social History   Socioeconomic History  . Marital status: Single    Spouse name: Not on file  . Number of children: 0  . Years of education: HS  . Highest education level: Not on file  Occupational History  . Occupation: Unemployed  Tobacco Use  . Smoking status: Former Research scientist (life sciences)  . Smokeless tobacco: Never Used  . Tobacco comment: Quit 10 years ago.  Substance and Sexual Activity  . Alcohol use: No    Alcohol/week: 0.0 standard drinks  . Drug use: No  . Sexual activity: Not on file  Other Topics Concern  . Not on file  Social History Narrative   Patient lives at home alone with her one dog and four cats.   Disabled   Education high school.   Right handed.      Social Determinants of Health   Financial Resource Strain:   . Difficulty of Paying Living Expenses: Not on file  Food Insecurity:   . Worried About Charity fundraiser in the Last Year: Not on file  . Ran Out of Food in the Last Year: Not on file  Transportation Needs:   . Lack of Transportation (Medical): Not on file  . Lack of Transportation (Non-Medical): Not on file  Physical Activity:   . Days of Exercise per Week: Not on file  . Minutes of Exercise per Session: Not on file   Stress:   . Feeling of Stress : Not on file  Social Connections:   . Frequency of Communication with Friends and Family: Not on file  . Frequency of Social Gatherings with Friends and Family: Not on file  . Attends Religious Services: Not on file  . Active Member of Clubs or Organizations: Not on file  . Attends Archivist Meetings: Not on file  . Marital Status: Not on file  Intimate Partner Violence:   . Fear of Current or Ex-Partner: Not on file  . Emotionally Abused: Not on file  . Physically Abused: Not on file  . Sexually Abused: Not on file    PHYSICAL EXAM  Vitals:   11/23/19 1001  BP: 124/86  Pulse: 82  Temp: (!) 97 F (36.1 C)  TempSrc: Oral  Weight: 241 lb 3.2 oz (109.4 kg)  Height: 5\' 3"  (1.6 m)   Body mass index is 42.73 kg/m.  Generalized: Well developed, in no acute distress  Neurological examination  Mentation: Alert oriented to time, place, history taking. Follows all commands speech and language fluent Cranial nerve II-XII: Pupils were equal round reactive to light. Extraocular movements were full, visual field were full on confrontational test. Facial sensation and strength were normal. Head turning was normal, shoulder shrug bilaterally is limited. Motor: The motor testing reveals 5 over 5 strength of all 4 extremities. Good symmetric motor tone is noted throughout.  Sensory: Sensory testing is intact to soft touch on all 4 extremities. No evidence of extinction is noted.  Coordination: Cerebellar testing reveals good finger-nose-finger and heel-to-shin bilaterally.  Gait and station: Gait is wide-based. Tandem gait is unsteady.. Romberg is negative. No drift is seen.  Reflexes: Deep tendon reflexes are symmetric  DIAGNOSTIC DATA (LABS, IMAGING, TESTING) - I reviewed patient records, labs, notes, testing and imaging myself where available.  Lab Results  Component Value Date   WBC 9.1 05/18/2019   HGB 13.1 05/18/2019   HCT 40.7  05/18/2019   MCV 91 05/18/2019   PLT 348 05/18/2019      Component Value Date/Time   NA 145 (H) 05/18/2019 1319   K 4.8 05/18/2019 1319   CL 101 05/18/2019 1319   CO2 27 05/18/2019 1319   GLUCOSE 93 05/18/2019 1319   GLUCOSE 87 11/18/2017 2235   BUN 7 05/18/2019 1319   CREATININE 0.68 05/18/2019 1319   CALCIUM 10.0 05/18/2019 1319   PROT 7.8 05/18/2019 1319   ALBUMIN 4.3 05/18/2019 1319   AST 15 05/18/2019 1319   ALT 24 05/18/2019 1319   ALKPHOS 148 (H) 05/18/2019 1319   BILITOT 0.3 05/18/2019 1319   GFRNONAA 101 05/18/2019 1319   GFRAA 116 05/18/2019 1319   No results found for: CHOL, HDL, LDLCALC, LDLDIRECT, TRIG, CHOLHDL No results found for: HGBA1C No results found for: VITAMINB12 No results found for: TSH  ASSESSMENT AND PLAN 54 y.o. year old female  has a past medical history of Abnormal Pap smear, Anxiety, Bipolar disorder ([place]), Dysmenorrhea, Dysplasia, Dysrhythmia, Dysuria, H/O head injury, Headache(784.0), High blood cholesterol, HSV-2 infection, hypercholesterolemia, ovarian cyst, seizure disorder, Mass of ovary, Menorrhagia, Mental retardation, Mild mental retardation, Perimenopausal vasomotor symptoms, PMS (premenstrual syndrome), PMS (premenstrual syndrome), PMS (premenstrual syndrome), RLS (restless legs syndrome), Seizures ([place]), Uterine mass, Vaginal yeast infection (2007), and Yeast vaginitis. here with:  1.  Headache 2.  Seizure disorder 3.  Bipolar disorder  Overall, her conditions are stable, and she seems to be doing well.  She will remain on her current medications.  I will send a refill of gabapentin, Seroquel, and Mirapex. Her phenobarbital level was normal in July 2020.  I will check blood work at her next follow-up appointment in 6 months.  I did advise if her symptoms worsen or she develops any new symptoms she should let us know.  Belsomra filled 10/24/2019 with 3 refills Mirapex, fill today Phenobarbital filled 10/26/2019, another for 6  months Gabapentin, filled today Klonopin, filled 11/14/2019, for 1 month, due later this month Seroquel, filled today (question whether her psychiatrist is filling this or not?)  I spent 15 minutes with the patient. 50% of this time was spent discussing her plan of care.  Butler Denmark, AGNP-C, DNP 11/23/2019, 10:07 AM [facility] [address], [place], [place] [address] [phone]

## 2019-11-23 NOTE — Progress Notes (Signed)
I have read the note, and I agree with the clinical assessment and plan.  Teresa Lawrence   

## 2019-11-24 ENCOUNTER — Other Ambulatory Visit: Payer: Self-pay

## 2019-11-24 ENCOUNTER — Encounter: Payer: Self-pay | Admitting: Physical Therapy

## 2019-11-24 ENCOUNTER — Ambulatory Visit: Payer: Medicare Other | Attending: Surgical | Admitting: Physical Therapy

## 2019-11-24 DIAGNOSIS — M25511 Pain in right shoulder: Secondary | ICD-10-CM | POA: Diagnosis present

## 2019-11-24 DIAGNOSIS — M25611 Stiffness of right shoulder, not elsewhere classified: Secondary | ICD-10-CM

## 2019-11-24 DIAGNOSIS — M6281 Muscle weakness (generalized): Secondary | ICD-10-CM | POA: Diagnosis present

## 2019-11-24 DIAGNOSIS — M25612 Stiffness of left shoulder, not elsewhere classified: Secondary | ICD-10-CM | POA: Diagnosis not present

## 2019-11-24 DIAGNOSIS — G8929 Other chronic pain: Secondary | ICD-10-CM | POA: Diagnosis present

## 2019-11-24 DIAGNOSIS — M25512 Pain in left shoulder: Secondary | ICD-10-CM | POA: Insufficient documentation

## 2019-11-24 NOTE — Therapy (Signed)
[place], [place], [address] Phone: [phone]   Fax:  [phone]  Physical Therapy Treatment  Patient Details  Name: Teresa Lawrence MRN: [id number] Date of Birth: [birth year] Referring Provider (PT): Dr. Marlou Sa    Encounter Date: 11/24/2019  PT End of Session - 11/24/19 1020    Visit Number  2    Number of Visits  12    Date for PT Re-Evaluation  12/27/19    Authorization Type  MCR/MCD    Authorization Time Period  10th visit PN    PT Start Time  0930    PT Stop Time  1015    PT Time Calculation (min)  45 min       Past Medical History:  Diagnosis Date  . Abnormal Pap smear   . Anxiety   . Bipolar disorder ([place])   . Dysmenorrhea   . Dysplasia   . Dysrhythmia    episodes of palpitations  . Dysuria   . H/O head injury   . Headache(784.0)   . High blood cholesterol   . HSV-2 infection   . Hx of hypercholesterolemia   . Hx of ovarian cyst   . Hx of seizure disorder   . Mass of ovary    left side  . Menorrhagia   . Mental retardation    mild [facility] in [facility]  . Mild mental retardation   . Perimenopausal vasomotor symptoms   . PMS (premenstrual syndrome)   . PMS (premenstrual syndrome)   . PMS (premenstrual syndrome)   . RLS (restless legs syndrome)   . Seizures ([place])   . Uterine mass   . Vaginal yeast infection 2007   recurrent   . Yeast vaginitis     Past Surgical History:  Procedure Laterality Date  . ABDOMINAL HYSTERECTOMY  2013  . COLPOSCOPY    . CYSTOSCOPY Bilateral 05/30/2013   Procedure: CYSTOSCOPY;  Surgeon: Betsy Coder, MD;  Location: [facility];  Service: Gynecology;  Laterality: Bilateral;  . DILATION AND CURETTAGE OF UTERUS    . ENDOMETRIAL BIOPSY    . HYSTEROSCOPY W/ ENDOMETRIAL ABLATION    . LAPAROSCOPIC HYSTERECTOMY Right 05/30/2013   Procedure: HYSTERECTOMY TOTAL LAPAROSCOPIC;  Surgeon: Betsy Coder, MD;  Location: [facility];  Service: Gynecology;   Laterality: Right;  Right Salpingectomy  . POLYPECTOMY    . WISDOM TOOTH EXTRACTION      There were no vitals filed for this visit.  Subjective Assessment - 11/24/19 1020    Subjective  I have tried to do the exercises.    Currently in Pain?  Yes    Pain Score  5     Pain Location  Shoulder    Pain Orientation  Right;Left    Pain Descriptors / Indicators  Aching    Pain Type  Chronic pain    Aggravating Factors   reaching    Pain Relieving Factors  resting         [facility] PT Assessment - 11/24/19 0001      AROM   Right Shoulder Flexion  125 Degrees   132 AAROM with pulley   Left Shoulder Flexion  127 Degrees   133 AAROM with pulley                  [facility] Adult PT Treatment/Exercise - 11/24/19 0001      Shoulder Exercises: Supine   Other Supine Exercises  supine chest press, pullovers, horizontals, Er  x 20 each       Shoulder Exercises: Standing   External Rotation  Strengthening;Both;15 reps    Theraband Level (Shoulder External Rotation)  Level 1 (Yellow)    Retraction Weight (lbs)  on wall x 20    Retraction Limitations  did well with cues to mimic standing row     Other Standing Exercises  facing wall/ hands on wall chest : yellow band around wrists-slide horizontal and back- alternating, then up and down wall       Shoulder Exercises: Pulleys   Flexion  2 minutes      Shoulder Exercises: ROM/Strengthening   UBE (Upper Arm Bike)  3 min forward, 2 min retro L1    Wall Wash  flexion towel slides x 10     Other ROM/Strengthening Exercises  Towel squeeze bilateral x 10       Manual Therapy   Manual therapy comments  Passive flexion and ER bilateral              PT Education - 11/24/19 1019    Education Details  HEP    Person(s) Educated  Patient    Methods  Explanation;Handout    Comprehension  Verbalized understanding          PT Long Term Goals - 11/15/19 1325      PT LONG TERM GOAL #1   Title  Pt will be I with HEP for bilateral  shoulder ROM and strength    Time  6    Period  Weeks    Status  New    Target Date  12/27/19      PT LONG TERM GOAL #2   Title  Pt will be able to reach to her low back with min discomfort in Rt UE    Time  6    Period  Weeks    Status  New    Target Date  12/27/19      PT LONG TERM GOAL #3   Title  Pt will be able to increase shoulder strength to 4/5 bilateral in flexion, abduction    Time  6    Period  Weeks    Status  New    Target Date  12/27/19      PT LONG TERM GOAL #4   Title  Pt will be able to lift 1-2 lbs above shoulder height with no difficulty    Time  6    Period  Weeks    Status  New    Target Date  12/27/19      PT LONG TERM GOAL #5   Title  FOTO score will increase to 43% or less disability to demo improvement in functional mobility.    Time  6    Period  Weeks    Status  New    Target Date  12/27/19            Plan - 11/24/19 1021    Clinical Impression Statement  Pt arrives reporting compliance with HEP. Her AROM has improved. Progressed with pulleys, UBE and additional AAROM therex. Added towel squeeze to HEP. She has difficulty with supine-sit and required min assit.    PT Next Visit Plan  check HEP, AAROM to AROM, work on sit <--> supine transition    PT Home Exercise Plan  wall wash, scapular retraction, ER yellow band, towel squeeze       Patient will benefit from skilled therapeutic intervention in order to improve the  following deficits and impairments:  Decreased activity tolerance, Decreased range of motion, Decreased strength, Increased fascial restricitons, Impaired UE functional use, Pain, Obesity, Postural dysfunction, Impaired flexibility, Decreased mobility  Visit Diagnosis: Stiffness of left shoulder, not elsewhere classified  Stiffness of right shoulder, not elsewhere classified  Muscle weakness (generalized)  Chronic left shoulder pain  Chronic right shoulder pain     Problem List Patient Active Problem List    Diagnosis Date Noted  . Nondisplaced fracture of lateral malleolus of left fibula, initial encounter for closed fracture 10/25/2017  . Insomnia 03/17/2013  . Headache 02/23/2013  . Hx of yeast infection 04/20/2012  . Hx of menorrhagia 04/20/2012  . 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 Bipolar disorder ([place]) 04/20/2012  . Hx of menorrhagia 04/20/2012  . Hx of ovarian cyst 04/20/2012  . Epilepsy ([place]) 04/20/2012  . Hx of seizure disorder 04/20/2012  . 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 Hx of dysmenorrhea 04/20/2012  . 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 Hx of hypercholesterolemia 04/20/2012  . Hx of PMS (premenstrual syndrome) 04/20/2012  . Encounter for therapeutic drug monitoring 05/01/2011    Dorene Ar, PTA 11/24/2019, 10:23 AM  [place], [place], [address] Phone: [phone]   Fax:  [phone]  Name: DURGA MINO MRN: [id number] Date of Birth: Jan 20, 1966

## 2019-11-28 ENCOUNTER — Ambulatory Visit: Payer: Medicare Other | Admitting: Physical Therapy

## 2019-11-28 ENCOUNTER — Encounter: Payer: Self-pay | Admitting: Physical Therapy

## 2019-11-28 ENCOUNTER — Other Ambulatory Visit: Payer: Self-pay

## 2019-11-28 DIAGNOSIS — M25512 Pain in left shoulder: Secondary | ICD-10-CM

## 2019-11-28 DIAGNOSIS — M6281 Muscle weakness (generalized): Secondary | ICD-10-CM

## 2019-11-28 DIAGNOSIS — M25611 Stiffness of right shoulder, not elsewhere classified: Secondary | ICD-10-CM

## 2019-11-28 DIAGNOSIS — M25612 Stiffness of left shoulder, not elsewhere classified: Secondary | ICD-10-CM

## 2019-11-28 DIAGNOSIS — G8929 Other chronic pain: Secondary | ICD-10-CM

## 2019-11-28 NOTE — Therapy (Signed)
[place], [place], [address] Phone: [phone]   Fax:  [phone]  Physical Therapy Treatment  Patient Details  Name: Teresa Lawrence MRN: [id number] Date of Birth: [birth year] Referring Provider (PT): Dr. Marlou Sa    Encounter Date: 11/28/2019  PT End of Session - 11/28/19 1240    Visit Number  3    Number of Visits  12    Date for PT Re-Evaluation  12/27/19    Authorization Type  MCR/MCD    PT Start Time  1130    PT Stop Time  1213    PT Time Calculation (min)  43 min    Activity Tolerance  Patient tolerated treatment well    Behavior During Therapy  [facility] for tasks assessed/performed       Past Medical History:  Diagnosis Date  . Abnormal Pap smear   . Anxiety   . Bipolar disorder ([place])   . Dysmenorrhea   . Dysplasia   . Dysrhythmia    episodes of palpitations  . Dysuria   . H/O head injury   . Headache(784.0)   . High blood cholesterol   . HSV-2 infection   . Hx of hypercholesterolemia   . Hx of ovarian cyst   . Hx of seizure disorder   . Mass of ovary    left side  . Menorrhagia   . Mental retardation    mild per Dr[facility] notes in [facility]  . Mild mental retardation   . Perimenopausal vasomotor symptoms   . PMS (premenstrual syndrome)   . PMS (premenstrual syndrome)   . PMS (premenstrual syndrome)   . RLS (restless legs syndrome)   . Seizures ([place])   . Uterine mass   . Vaginal yeast infection 2007   recurrent   . Yeast vaginitis     Past Surgical History:  Procedure Laterality Date  . ABDOMINAL HYSTERECTOMY  2013  . COLPOSCOPY    . CYSTOSCOPY Bilateral 05/30/2013   Procedure: CYSTOSCOPY;  Surgeon: Betsy Coder, MD;  Location: [facility];  Service: Gynecology;  Laterality: Bilateral;  . DILATION AND CURETTAGE OF UTERUS    . ENDOMETRIAL BIOPSY    . HYSTEROSCOPY W/ ENDOMETRIAL ABLATION    . LAPAROSCOPIC HYSTERECTOMY Right 05/30/2013   Procedure: HYSTERECTOMY TOTAL LAPAROSCOPIC;  Surgeon:  Betsy Coder, MD;  Location: [facility];  Service: Gynecology;  Laterality: Right;  Right Salpingectomy  . POLYPECTOMY    . WISDOM TOOTH EXTRACTION      There were no vitals filed for this visit.  Subjective Assessment - 11/28/19 1137    Subjective  I did my exercises today and each morning.  Its hard to get out of the chair. I have RA.    Currently in Pain?  Yes    Pain Score  3     Pain Location  Shoulder    Pain Orientation  Right    Pain Descriptors / Indicators  Aching    Pain Type  Chronic pain    Pain Onset  More than a month ago    Pain Frequency  Intermittent    Aggravating Factors   lift arm    Pain Relieving Factors  rest         [facility] Adult PT Treatment/Exercise - 11/28/19 0001      Shoulder Exercises: Seated   Retraction  Strengthening;Both;10 reps    Horizontal ABduction  Strengthening;Both;10 reps    Theraband Level (Shoulder Horizontal ABduction)  Level 2 (Red)  External Rotation  Strengthening;Both;10 reps;Theraband    Theraband Level (Shoulder External Rotation)  Level 2 (Red)    External Rotation Weight (lbs)  2 sets       Shoulder Exercises: Standing   Protraction Weight (lbs)  retraction x 10 with ball and arm elevation x 10 AAROM     Extension  Strengthening;Both;10 reps    Theraband Level (Shoulder Extension)  Level 2 (Red)    Row  Strengthening;Both;10 reps    Theraband Level (Shoulder Row)  Level 2 (Red)    Other Standing Exercises  extension AAROM x 10      Shoulder Exercises: Pulleys   Flexion  3 minutes      Shoulder Exercises: Stretch   Wall Stretch - Flexion Limitations  x 10       Manual Therapy   Manual therapy comments  Passive flexion and ER bilateral    limited in all planes, flexion 120, abd 90 and ER 10-15 (at             PT Education - 11/28/19 1239    Education Details  posture, ROM and stretching, transitions from sitting to supine and sitting    Person(s) Educated  Patient    Methods  Explanation;Demonstration     Comprehension  Verbalized understanding;Returned demonstration          PT Long Term Goals - 11/28/19 1240      PT LONG TERM GOAL #1   Title  Pt will be I with HEP for bilateral shoulder ROM and strength    Status  On-going      PT LONG TERM GOAL #2   Title  Pt will be able to reach to her low back with min discomfort in Rt UE    Status  On-going      PT LONG TERM GOAL #3   Title  Pt will be able to increase shoulder strength to 4/5 bilateral in flexion, abduction    Status  On-going      PT LONG TERM GOAL #4   Title  Pt will be able to lift 1-2 lbs above shoulder height with no difficulty    Status  On-going      PT LONG TERM GOAL #5   Title  FOTO score will increase to 43% or less disability to demo improvement in functional mobility.    Status  On-going            Plan - 11/28/19 1241    Clinical Impression Statement  Patient continues to be limited in all planes of ROM, strength and exhibits poor posture, inability to effectively activate her periscapular muscles. She has difficulty transitioning from positions to and from mat. Does not tolerate manual well enough to push through painful ROM.    PT Treatment/Interventions  ADLs/Self Care Home Management;Iontophoresis 4mg /ml Dexamethasone;Cryotherapy;Electrical Stimulation;Moist Heat;Ultrasound;Functional mobility training;Therapeutic activities;Therapeutic exercise;Manual techniques;Passive range of motion;Neuromuscular re-education;Taping;Patient/family education    PT Next Visit Plan  check HEP, AAROM to AROM, work on sit <--> supine transition. Add row, extension has red band    PT Home Exercise Plan  wall wash, scapular retraction, ER yellow band, towel squeeze    Consulted and Agree with Plan of Care  Patient       Patient will benefit from skilled therapeutic intervention in order to improve the following deficits and impairments:  Decreased activity tolerance, Decreased range of motion, Decreased strength,  Increased fascial restricitons, Impaired UE functional use, Pain, Obesity, Postural dysfunction, Impaired flexibility, Decreased  mobility  Visit Diagnosis: Stiffness of left shoulder, not elsewhere classified  Stiffness of right shoulder, not elsewhere classified  Muscle weakness (generalized)  Chronic left shoulder pain  Chronic right shoulder pain     Problem List Patient Active Problem List   Diagnosis Date Noted  . Nondisplaced fracture of lateral malleolus of left fibula, initial encounter for closed fracture 10/25/2017  . Insomnia 03/17/2013  . Headache 02/23/2013  . Hx of yeast infection 04/20/2012  . Hx of menorrhagia 04/20/2012  . 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 Bipolar disorder ([place]) 04/20/2012  . Hx of menorrhagia 04/20/2012  . Hx of ovarian cyst 04/20/2012  . Epilepsy ([place]) 04/20/2012  . Hx of seizure disorder 04/20/2012  . 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 Hx of dysmenorrhea 04/20/2012  . 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 Hx of hypercholesterolemia 04/20/2012  . Hx of PMS (premenstrual syndrome) 04/20/2012  . Encounter for therapeutic drug monitoring 05/01/2011    Zakira Ressel 11/28/2019, 12:47 PM  [place], [place], [address] Phone: [phone]   Fax:  [phone]  Name: NAMIAH KALLIN MRN: [id number] Date of Birth: [birth year]  Raeford Razor, PT 11/28/19 12:47 PM Phone: [phone] Fax: [phone]

## 2019-11-29 ENCOUNTER — Ambulatory Visit (INDEPENDENT_AMBULATORY_CARE_PROVIDER_SITE_OTHER): Payer: Medicare Other | Admitting: Orthopedic Surgery

## 2019-11-29 DIAGNOSIS — M7501 Adhesive capsulitis of right shoulder: Secondary | ICD-10-CM | POA: Diagnosis not present

## 2019-11-29 DIAGNOSIS — M7502 Adhesive capsulitis of left shoulder: Secondary | ICD-10-CM

## 2019-11-29 MED ORDER — MELOXICAM 15 MG PO TABS: 15.0000 mg | ORAL_TABLET | Freq: Every day | ORAL | 1 refills | Status: DC

## 2019-12-01 ENCOUNTER — Ambulatory Visit: Payer: Medicare Other | Admitting: Physical Therapy

## 2019-12-01 ENCOUNTER — Encounter: Payer: Self-pay | Admitting: Orthopedic Surgery

## 2019-12-01 NOTE — Progress Notes (Signed)
Office Visit Note   Patient: Teresa Lawrence           Date of Birth: 07-Nov-1965           MRN: [id number] Visit Date: 11/29/2019 Requested by: Tamsen Roers, [place],  [address] [address] PCP: Tamsen Roers, MD  Subjective: Chief Complaint  Patient presents with  . Left Shoulder - Follow-up    HPI: Teresa Lawrence is a 54 y.o. female who presents to the office complaining of bilateral shoulder pain.  She returns for clinical check on her progress with bilateral adhesive capsulitis of the left and right shoulders.  She has been going to physical therapy and states that this is helping and she has decreased stiffness of both shoulders.  She notes that the right shoulder is worse than the left shoulder.  She takes Tylenol as needed.  She saw her rheumatologist who told patient that her RA blood panel was negative but her suspicion was still moderately high that she has some sort of rheumatoid arthritis or autoimmune disease with involvement in her hands.  She will be making an appointment with Dr. Fredna Dow regarding this..                ROS:  All systems reviewed are negative as they relate to the chief complaint within the history of present illness.  Patient denies fevers or chills.  Assessment & Plan: Visit Diagnoses:  1. Bilateral adhesive capsulitis of shoulders     Plan: Patient is a 54 year old female who presents complaining of bilateral shoulder pain.  She has been going to physical therapy as treatment for her bilateral adhesive capsulitis of the right and left shoulders.  Her motion has progressed compared with the last office visit.  If it continues to progress then I think patient should be able to avoid surgery.  This is her hope as she does not want to have a manipulation.  Patient will continue physical therapy at [facility] outpatient therapy 2 times a week for the next 6 weeks and return in 6 weeks for clinical recheck.  Also prescribed meloxicam to help with  patient's pain.  She has had 1 prior cortisone injection in both shoulders.  Follow-Up Instructions: No follow-ups on file.   Orders:  Orders Placed This Encounter  Procedures  . Ambulatory referral to Physical Therapy   Meds ordered this encounter  Medications  . meloxicam (MOBIC) 15 MG tablet    Sig: Take 1 tablet (15 mg total) by mouth daily.    Dispense:  30 tablet    Refill:  1      Procedures: No procedures performed   Clinical Data: No additional findings.  Objective: Vital Signs: LMP 05/11/2013   Physical Exam:  Constitutional: Patient appears well-developed HEENT:  Head: Normocephalic Eyes:EOM are normal Neck: Normal range of motion Cardiovascular: Normal rate Pulmonary/chest: Effort normal Neurologic: Patient is alert Skin: Skin is warm Psychiatric: Patient has normal mood and affect  Ortho Exam:  Bilateral shoulder Exam Left shoulder with greater than 90 degrees of forward flexion and about 90 degrees of abduction.  Right shoulder with 75 degrees of abduction and about 80 degrees of forward flexion.  Markedly diminished external rotation, worse on the right side. No TTP over the [facility] joint or bicipital groove Good subscapularis, supraspinatus, and infraspinatus strength Negative Hawkins impingement 5/5 grip strength, forearm pronation/supination, and bicep strength  Specialty Comments:  No specialty comments available.  Imaging: No results  found.   PMFS History: Patient Active Problem List   Diagnosis Date Noted  . Nondisplaced fracture of lateral malleolus of left fibula, initial encounter for closed fracture 10/25/2017  . Insomnia 03/17/2013  . Headache 02/23/2013  . Hx of yeast infection 04/20/2012  . Hx of menorrhagia 04/20/2012  . 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 Bipolar disorder ([place]) 04/20/2012  . Hx of menorrhagia 04/20/2012  . Hx of ovarian cyst 04/20/2012  . Epilepsy ([place]) 04/20/2012  . Hx of seizure disorder  04/20/2012  . 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 Hx of dysmenorrhea 04/20/2012  . 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 Hx of hypercholesterolemia 04/20/2012  . Hx of PMS (premenstrual syndrome) 04/20/2012  . Encounter for therapeutic drug monitoring 05/01/2011   Past Medical History:  Diagnosis Date  . Abnormal Pap smear   . Anxiety   . Bipolar disorder ([place])   . Dysmenorrhea   . Dysplasia   . Dysrhythmia    episodes of palpitations  . Dysuria   . H/O head injury   . Headache(784.0)   . High blood cholesterol   . HSV-2 infection   . Hx of hypercholesterolemia   . Hx of ovarian cyst   . Hx of seizure disorder   . Mass of ovary    left side  . Menorrhagia   . Mental retardation    mild per [facility] in [facility]  . Mild mental retardation   . Perimenopausal vasomotor symptoms   . PMS (premenstrual syndrome)   . PMS (premenstrual syndrome)   . PMS (premenstrual syndrome)   . RLS (restless legs syndrome)   . Seizures ([place])   . Uterine mass   . Vaginal yeast infection 2007   recurrent   . Yeast vaginitis     Family History  Problem Relation Age of Onset  . Heart attack Father   . Breast cancer Sister     Past Surgical History:  Procedure Laterality Date  . ABDOMINAL HYSTERECTOMY  2013  . COLPOSCOPY    . CYSTOSCOPY Bilateral 05/30/2013   Procedure: CYSTOSCOPY;  Surgeon: Betsy Coder, MD;  Location: [facility];  Service: Gynecology;  Laterality: Bilateral;  . DILATION AND CURETTAGE OF UTERUS    . ENDOMETRIAL BIOPSY    . HYSTEROSCOPY W/ ENDOMETRIAL ABLATION    . LAPAROSCOPIC HYSTERECTOMY Right 05/30/2013   Procedure: HYSTERECTOMY TOTAL LAPAROSCOPIC;  Surgeon: Betsy Coder, MD;  Location: [facility];  Service: Gynecology;  Laterality: Right;  Right Salpingectomy  . POLYPECTOMY    . WISDOM TOOTH EXTRACTION     Social History   Occupational History  . Occupation: Unemployed  Tobacco Use  . Smoking status: Former Research scientist (life sciences)  . Smokeless tobacco: Never Used  . Tobacco  comment: Quit 10 years ago.  Substance and Sexual Activity  . Alcohol use: No    Alcohol/week: 0.0 standard drinks  . Drug use: No  . Sexual activity: Not on file

## 2019-12-05 ENCOUNTER — Other Ambulatory Visit: Payer: Self-pay

## 2019-12-05 ENCOUNTER — Ambulatory Visit: Payer: Medicare Other | Admitting: Physical Therapy

## 2019-12-05 DIAGNOSIS — M6281 Muscle weakness (generalized): Secondary | ICD-10-CM

## 2019-12-05 DIAGNOSIS — M25612 Stiffness of left shoulder, not elsewhere classified: Secondary | ICD-10-CM | POA: Diagnosis not present

## 2019-12-05 DIAGNOSIS — G8929 Other chronic pain: Secondary | ICD-10-CM

## 2019-12-05 DIAGNOSIS — M25611 Stiffness of right shoulder, not elsewhere classified: Secondary | ICD-10-CM

## 2019-12-05 NOTE — Therapy (Signed)
[place], [place], [address] Phone: [phone]   Fax:  [phone]  Physical Therapy Treatment  Patient Details  Name: Teresa Lawrence MRN: [id number] Date of Birth: May 25, 1966 Referring Provider (PT): Dr. Marlou Sa    Encounter Date: 12/05/2019  PT End of Session - 12/05/19 1331    Visit Number  4    Number of Visits  12    Date for PT Re-Evaluation  12/27/19    Authorization Type  MCR/MCD    Authorization Time Period  10th visit PN    PT Start Time  1141    PT Stop Time  1215    PT Time Calculation (min)  34 min    Activity Tolerance  Patient tolerated treatment well    Behavior During Therapy  [facility] for tasks assessed/performed       Past Medical History:  Diagnosis Date  . Abnormal Pap smear   . Anxiety   . Bipolar disorder ([place])   . Dysmenorrhea   . Dysplasia   . Dysrhythmia    episodes of palpitations  . Dysuria   . H/O head injury   . Headache(784.0)   . High blood cholesterol   . HSV-2 infection   . Hx of hypercholesterolemia   . Hx of ovarian cyst   . Hx of seizure disorder   . Mass of ovary    left side  . Menorrhagia   . Mental retardation    mild per Dr. notes in [facility]  . Mild mental retardation   . Perimenopausal vasomotor symptoms   . PMS (premenstrual syndrome)   . PMS (premenstrual syndrome)   . PMS (premenstrual syndrome)   . RLS (restless legs syndrome)   . Seizures ([place])   . Uterine mass   . Vaginal yeast infection 2007   recurrent   . Yeast vaginitis     Past Surgical History:  Procedure Laterality Date  . ABDOMINAL HYSTERECTOMY  2013  . COLPOSCOPY    . CYSTOSCOPY Bilateral 05/30/2013   Procedure: CYSTOSCOPY;  Surgeon: Betsy Coder, MD;  Location: [facility];  Service: Gynecology;  Laterality: Bilateral;  . DILATION AND CURETTAGE OF UTERUS    . ENDOMETRIAL BIOPSY    . HYSTEROSCOPY W/ ENDOMETRIAL ABLATION    . LAPAROSCOPIC HYSTERECTOMY Right 05/30/2013   Procedure: HYSTERECTOMY TOTAL LAPAROSCOPIC;  Surgeon: Betsy Coder, MD;  Location: [facility];  Service: Gynecology;  Laterality: Right;  Right Salpingectomy  . POLYPECTOMY    . WISDOM TOOTH EXTRACTION      There were no vitals filed for this visit.  Subjective Assessment - 12/05/19 1143    Subjective  L one hurting more than the Rt now.  Did my exercises.  Pt arrived late and apologetic.    Currently in Pain?  Yes    Pain Score  3     Pain Location  Shoulder    Pain Orientation  Right    Pain Descriptors / Indicators  Aching    Pain Type  Chronic pain    Pain Onset  More than a month ago    Pain Frequency  Intermittent    Multiple Pain Sites  Yes    Pain Score  4    Pain Location  Shoulder    Pain Orientation  Left    Pain Descriptors / Indicators  Aching    Pain Type  Chronic pain    Pain Onset  More than a month ago  Pain Frequency  Intermittent         [facility] Adult PT Treatment/Exercise - 12/05/19 0001      Shoulder Exercises: Supine   Horizontal ABduction  Strengthening;Both;10 reps    Theraband Level (Shoulder Horizontal ABduction)  Level 1 (Yellow)    External Rotation  Strengthening;Both;10 reps    Theraband Level (Shoulder External Rotation)  Level 1 (Yellow)    Other Supine Exercises  extension with PT assist yellow band x 10       Shoulder Exercises: Standing   Protraction Weight (lbs)  retraction x 10 with ball and arm elevation x 10 AAROM     External Rotation  Strengthening;Both;10 reps    Theraband Level (Shoulder External Rotation)  Level 1 (Yellow)    External Rotation Weight (lbs)  cues     Shoulder Flexion Weight (lbs)  wall wash x 10 for AAROM     Extension  Strengthening;Both;10 reps      Shoulder Exercises: Pulleys   Flexion  3 minutes      Shoulder Exercises: ROM/Strengthening   UBE (Upper Arm Bike)  5 min level 1, 4 min forward and last min in reverse       Manual Therapy   Manual Therapy  Passive ROM    Passive ROM  All planes to  tolerance                   PT Long Term Goals - 11/28/19 1240      PT LONG TERM GOAL #1   Title  Pt will be I with HEP for bilateral shoulder ROM and strength    Status  On-going      PT LONG TERM GOAL #2   Title  Pt will be able to reach to her low back with min discomfort in Rt UE    Status  On-going      PT LONG TERM GOAL #3   Title  Pt will be able to increase shoulder strength to 4/5 bilateral in flexion, abduction    Status  On-going      PT LONG TERM GOAL #4   Title  Pt will be able to lift 1-2 lbs above shoulder height with no difficulty    Status  On-going      PT LONG TERM GOAL #5   Title  FOTO score will increase to 43% or less disability to demo improvement in functional mobility.    Status  On-going            Plan - 12/05/19 1331    Clinical Impression Statement  Pt able to tolerate increased PROM into flexion and abduction, especially L UE.  Needed increased time mat to supine.  Improved scapular retraction , more effective against wall.    PT Treatment/Interventions  ADLs/Self Care Home Management;Iontophoresis 4mg /ml Dexamethasone;Cryotherapy;Electrical Stimulation;Moist Heat;Ultrasound;Functional mobility training;Therapeutic activities;Therapeutic exercise;Manual techniques;Passive range of motion;Neuromuscular re-education;Taping;Patient/family education    PT Next Visit Plan  check HEP, AAROM to AROM, work on sit <--> supine transition. Add row, extension has red band    PT Home Exercise Plan  wall wash, scapular retraction, ER, horizontal abd yellow band, towel squeeze    Consulted and Agree with Plan of Care  Patient       Patient will benefit from skilled therapeutic intervention in order to improve the following deficits and impairments:  Decreased activity tolerance, Decreased range of motion, Decreased strength, Increased fascial restricitons, Impaired UE functional use, Pain, Obesity, Postural dysfunction, Impaired flexibility,  Decreased mobility  Visit Diagnosis: Stiffness of left shoulder, not elsewhere classified  Stiffness of right shoulder, not elsewhere classified  Muscle weakness (generalized)  Chronic left shoulder pain  Chronic right shoulder pain     Problem List Patient Active Problem List   Diagnosis Date Noted  . Nondisplaced fracture of lateral malleolus of left fibula, initial encounter for closed fracture 10/25/2017  . Insomnia 03/17/2013  . Headache 02/23/2013  . Hx of yeast infection 04/20/2012  . Hx of menorrhagia 04/20/2012  . 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 Bipolar disorder ([place]) 04/20/2012  . Hx of menorrhagia 04/20/2012  . Hx of ovarian cyst 04/20/2012  . Epilepsy ([place]) 04/20/2012  . Hx of seizure disorder 04/20/2012  . 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 Hx of dysmenorrhea 04/20/2012  . 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 Hx of hypercholesterolemia 04/20/2012  . Hx of PMS (premenstrual syndrome) 04/20/2012  . Encounter for therapeutic drug monitoring 05/01/2011    Hillard Goodwine 12/05/2019, 1:37 PM  [facility] [address] [place], [place], [address] Phone: [phone]   Fax:  [phone]  Name: Teresa Lawrence MRN: [id number] Date of Birth: 1966-02-09  Raeford Razor, PT 12/05/19 1:37 PM Phone: [phone] Fax: [phone]

## 2019-12-05 NOTE — Patient Instructions (Signed)
Resisted Horizontal Abduction: Bilateral    Sit or stand, tubing in both hands, arms out in front. Keeping arms straight, pinch shoulder blades together and stretch arms out. Repeat ___10_ times per set. Do __2-3__ sets per session. Do _2___ sessions per day.  http://orth.exer.us/969   Copyright  VHI. All rights reserved.

## 2019-12-08 ENCOUNTER — Ambulatory Visit: Payer: Medicare Other | Admitting: Physical Therapy

## 2019-12-08 ENCOUNTER — Other Ambulatory Visit: Payer: Self-pay

## 2019-12-08 ENCOUNTER — Encounter: Payer: Self-pay | Admitting: Physical Therapy

## 2019-12-08 DIAGNOSIS — M25512 Pain in left shoulder: Secondary | ICD-10-CM

## 2019-12-08 DIAGNOSIS — M6281 Muscle weakness (generalized): Secondary | ICD-10-CM

## 2019-12-08 DIAGNOSIS — M25611 Stiffness of right shoulder, not elsewhere classified: Secondary | ICD-10-CM

## 2019-12-08 DIAGNOSIS — M25612 Stiffness of left shoulder, not elsewhere classified: Secondary | ICD-10-CM | POA: Diagnosis not present

## 2019-12-08 DIAGNOSIS — G8929 Other chronic pain: Secondary | ICD-10-CM

## 2019-12-08 NOTE — Patient Instructions (Signed)
Access Code: [email or web address]  URL: [email or web address]  Date: 12/08/2019  Prepared by: Raeford Razor   Exercises  Standing Bilateral Low Shoulder Row with Anchored Resistance - 10 reps - 2 sets - 5 hold - 1x daily - 7x weekly  Shoulder extension with resistance - Neutral - 10 reps - 2 sets - 5 hold - 1x daily - 7x weekly  Sleeper Stretch - 10 reps - 1 sets - 30 hold - 1x daily - 7x weekly

## 2019-12-08 NOTE — Therapy (Signed)
[place], [place], [address] Phone: [phone]   Fax:  [phone]  Physical Therapy Treatment  Patient Details  Name: Teresa Lawrence MRN: [id number] Date of Birth: [birth year] Referring Provider (PT): Dr. Marlou Sa    Encounter Date: 12/08/2019  PT End of Session - 12/08/19 1052    Visit Number  5    Number of Visits  12    Date for PT Re-Evaluation  12/27/19    Authorization Type  MCR/MCD    Authorization Time Period  10th visit PN    PT Start Time  1046    PT Stop Time  1127    PT Time Calculation (min)  41 min    Activity Tolerance  Patient tolerated treatment well    Behavior During Therapy  [facility] for tasks assessed/performed       Past Medical History:  Diagnosis Date  . Abnormal Pap smear   . Anxiety   . Bipolar disorder ([place])   . Dysmenorrhea   . Dysplasia   . Dysrhythmia    episodes of palpitations  . Dysuria   . H/O head injury   . Headache(784.0)   . High blood cholesterol   . HSV-2 infection   . Hx of hypercholesterolemia   . Hx of ovarian cyst   . Hx of seizure disorder   . Mass of ovary    left side  . Menorrhagia   . Mental retardation    mild per Dr. notes in [facility]  . Mild mental retardation   . Perimenopausal vasomotor symptoms   . PMS (premenstrual syndrome)   . PMS (premenstrual syndrome)   . PMS (premenstrual syndrome)   . RLS (restless legs syndrome)   . Seizures ([place])   . Uterine mass   . Vaginal yeast infection 2007   recurrent   . Yeast vaginitis     Past Surgical History:  Procedure Laterality Date  . ABDOMINAL HYSTERECTOMY  2013  . COLPOSCOPY    . CYSTOSCOPY Bilateral 05/30/2013   Procedure: CYSTOSCOPY;  Surgeon: Betsy Coder, MD;  Location: [facility];  Service: Gynecology;  Laterality: Bilateral;  . DILATION AND CURETTAGE OF UTERUS    . ENDOMETRIAL BIOPSY    . HYSTEROSCOPY W/ ENDOMETRIAL ABLATION    . LAPAROSCOPIC HYSTERECTOMY Right 05/30/2013    Procedure: HYSTERECTOMY TOTAL LAPAROSCOPIC;  Surgeon: Betsy Coder, MD;  Location: [facility];  Service: Gynecology;  Laterality: Right;  Right Salpingectomy  . POLYPECTOMY    . WISDOM TOOTH EXTRACTION      There were no vitals filed for this visit.  Subjective Assessment - 12/08/19 1049    Subjective  Sore right now (2/10) did her exercises this AM.    Currently in Pain?  Yes    Pain Score  2     Pain Location  Shoulder    Pain Orientation  Right;Left    Pain Descriptors / Indicators  Sore    Pain Type  Chronic pain    Pain Onset  More than a month ago    Pain Frequency  Intermittent    Aggravating Factors   lifting, reaching out to the side    Pain Relieving Factors  rest          [facility] Adult PT Treatment/Exercise - 12/08/19 0001      Shoulder Exercises: Sidelying   External Rotation  Strengthening;Both;15 reps;Weights    External Rotation Weight (lbs)  1    Flexion  Strengthening;Both;10  reps    Flexion Weight (lbs)  1    Other Sidelying Exercises  sleeper stretch x 10 , 10 sec       Shoulder Exercises: Standing   Shoulder Flexion Weight (lbs)  UE ranger flexion and medium circles with UE Ranger     Shoulder ABduction Weight (lbs)  UE ranger x 15     Retraction Weight (lbs)  during flexion     Shoulder Elevation Limitations  UE ranger standing x 10       Shoulder Exercises: Pulleys   Flexion  3 minutes      Shoulder Exercises: Stretch   Corner Stretch Limitations  doorway, too painful, did 1 side at a time, x 10 , 10 sec     External Rotation Stretch  3 reps;30 seconds             PT Education - 12/08/19 1120    Education Details  new HEP    Person(s) Educated  Patient    Methods  Explanation;Demonstration    Comprehension  Verbalized understanding;Returned demonstration          PT Long Term Goals - 11/28/19 1240      PT LONG TERM GOAL #1   Title  Pt will be I with HEP for bilateral shoulder ROM and strength    Status  On-going      PT LONG  TERM GOAL #2   Title  Pt will be able to reach to her low back with min discomfort in Rt UE    Status  On-going      PT LONG TERM GOAL #3   Title  Pt will be able to increase shoulder strength to 4/5 bilateral in flexion, abduction    Status  On-going      PT LONG TERM GOAL #4   Title  Pt will be able to lift 1-2 lbs above shoulder height with no difficulty    Status  On-going      PT LONG TERM GOAL #5   Title  FOTO score will increase to 43% or less disability to demo improvement in functional mobility.    Status  On-going            Plan - 12/08/19 1124    Clinical Impression Statement  Patient shows improved AROM and tolerance for exercise.  Advanced her HEP.  She does not sleep in a recliner so the sleeper stretch will need to be modified. Shows good motivation.Marland Kitchen    PT Treatment/Interventions  ADLs/Self Care Home Management;Iontophoresis 4mg /ml Dexamethasone;Cryotherapy;Electrical Stimulation;Moist Heat;Ultrasound;Functional mobility training;Therapeutic activities;Therapeutic exercise;Manual techniques;Passive range of motion;Neuromuscular re-education;Taping;Patient/family education    PT Next Visit Plan  check HEP, AAROM to AROM, work on sit <--> supine transition.    PT Home Exercise Plan  wall wash, scapular retraction, ER, horizontal abd yellow band, towel squeeze,Add row, extension has red band    Consulted and Agree with Plan of Care  Patient       Patient will benefit from skilled therapeutic intervention in order to improve the following deficits and impairments:  Decreased activity tolerance, Decreased range of motion, Decreased strength, Increased fascial restricitons, Impaired UE functional use, Pain, Obesity, Postural dysfunction, Impaired flexibility, Decreased mobility  Visit Diagnosis: Stiffness of left shoulder, not elsewhere classified  Stiffness of right shoulder, not elsewhere classified  Muscle weakness (generalized)  Chronic left shoulder  pain  Chronic right shoulder pain     Problem List Patient Active Problem List   Diagnosis Date Noted  .  Nondisplaced fracture of lateral malleolus of left fibula, initial encounter for closed fracture 10/25/2017  . Insomnia 03/17/2013  . Headache 02/23/2013  . Hx of yeast infection 04/20/2012  . Hx of menorrhagia 04/20/2012  . 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 Bipolar disorder ([place]) 04/20/2012  . Hx of menorrhagia 04/20/2012  . Hx of ovarian cyst 04/20/2012  . Epilepsy ([place]) 04/20/2012  . Hx of seizure disorder 04/20/2012  . 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 Hx of dysmenorrhea 04/20/2012  . 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 Hx of hypercholesterolemia 04/20/2012  . Hx of PMS (premenstrual syndrome) 04/20/2012  . Encounter for therapeutic drug monitoring 05/01/2011    Teresa Lawrence 12/08/2019, 11:30 AM  [place], [place], [address] Phone: [phone]   Fax:  [phone]  Name: Teresa Lawrence MRN: [id number] Date of Birth: [birth year]   Raeford Razor, PT 12/08/19 11:32 AM Phone: [phone] Fax: [phone]

## 2019-12-12 ENCOUNTER — Ambulatory Visit: Payer: Medicare Other | Admitting: Physical Therapy

## 2019-12-12 ENCOUNTER — Encounter: Payer: Self-pay | Admitting: Physical Therapy

## 2019-12-12 ENCOUNTER — Other Ambulatory Visit: Payer: Self-pay

## 2019-12-12 DIAGNOSIS — M25612 Stiffness of left shoulder, not elsewhere classified: Secondary | ICD-10-CM

## 2019-12-12 DIAGNOSIS — G8929 Other chronic pain: Secondary | ICD-10-CM

## 2019-12-12 DIAGNOSIS — M25511 Pain in right shoulder: Secondary | ICD-10-CM

## 2019-12-12 DIAGNOSIS — M25512 Pain in left shoulder: Secondary | ICD-10-CM

## 2019-12-12 DIAGNOSIS — M6281 Muscle weakness (generalized): Secondary | ICD-10-CM

## 2019-12-12 DIAGNOSIS — M25611 Stiffness of right shoulder, not elsewhere classified: Secondary | ICD-10-CM

## 2019-12-12 NOTE — Therapy (Signed)
[place], [place], [address] Phone: [phone]   Fax:  [phone]  Physical Therapy Treatment  Patient Details  Name: Teresa Lawrence MRN: [id number] Date of Birth: 08-Mar-1966 Referring Provider (PT): Dr. Marlou Sa    Encounter Date: 12/12/2019  PT End of Session - 12/12/19 1102    Visit Number  6    Number of Visits  12    Date for PT Re-Evaluation  12/27/19    Authorization Type  MCR/MCD    Authorization Time Period  10th visit PN    PT Start Time  1103    PT Stop Time  1145    PT Time Calculation (min)  42 min       Past Medical History:  Diagnosis Date  . Abnormal Pap smear   . Anxiety   . Bipolar disorder ([place])   . Dysmenorrhea   . Dysplasia   . Dysrhythmia    episodes of palpitations  . Dysuria   . H/O head injury   . Headache(784.0)   . High blood cholesterol   . HSV-2 infection   . Hx of hypercholesterolemia   . Hx of ovarian cyst   . Hx of seizure disorder   . Mass of ovary    left side  . Menorrhagia   . Mental retardation    mild per Dr. notes in [facility]  . Mild mental retardation   . Perimenopausal vasomotor symptoms   . PMS (premenstrual syndrome)   . PMS (premenstrual syndrome)   . PMS (premenstrual syndrome)   . RLS (restless legs syndrome)   . Seizures ([place])   . Uterine mass   . Vaginal yeast infection 2007   recurrent   . Yeast vaginitis     Past Surgical History:  Procedure Laterality Date  . ABDOMINAL HYSTERECTOMY  2013  . COLPOSCOPY    . CYSTOSCOPY Bilateral 05/30/2013   Procedure: CYSTOSCOPY;  Surgeon: Betsy Coder, MD;  Location: [facility];  Service: Gynecology;  Laterality: Bilateral;  . DILATION AND CURETTAGE OF UTERUS    . ENDOMETRIAL BIOPSY    . HYSTEROSCOPY W/ ENDOMETRIAL ABLATION    . LAPAROSCOPIC HYSTERECTOMY Right 05/30/2013   Procedure: HYSTERECTOMY TOTAL LAPAROSCOPIC;  Surgeon: Betsy Coder, MD;  Location: [facility];  Service: Gynecology;   Laterality: Right;  Right Salpingectomy  . POLYPECTOMY    . WISDOM TOOTH EXTRACTION      There were no vitals filed for this visit.  Subjective Assessment - 12/12/19 1106    Subjective  No pain. My shoulders are feeling better.    Currently in Pain?  No/denies                       [facility] Adult PT Treatment/Exercise - 12/12/19 0001      Shoulder Exercises: Supine   Other Supine Exercises  supine chest press, pullovers, horizontals, Er x 20 each       Shoulder Exercises: Standing   External Rotation  Right;Left;20 reps    Theraband Level (Shoulder External Rotation)  Level 1 (Yellow)    Internal Rotation  Right;Left;20 reps    Theraband Level (Shoulder Internal Rotation)  Level 1 (Yellow)    Extension  Strengthening;Both;20 reps    Theraband Level (Shoulder Extension)  Level 2 (Red)    Row  Strengthening;Both;20 reps    Theraband Level (Shoulder Row)  Level 2 (Red)    Other Standing Exercises  UE ranger IR AAROM  reach behinds x 10 each , standing cane IR x 10       Shoulder Exercises: Pulleys   Flexion  --      Shoulder Exercises: ROM/Strengthening   UBE (Upper Arm Bike)  5 min level 1, 4 min forward and last min in reverse       Shoulder Exercises: Stretch   Corner Stretch Limitations  supine hands behind head 10 sec x 3     Internal Rotation Stretch  2 reps    Internal Rotation Stretch Limitations  20 sec each towel roll distraction                   PT Long Term Goals - 11/28/19 1240      PT LONG TERM GOAL #1   Title  Pt will be I with HEP for bilateral shoulder ROM and strength    Status  On-going      PT LONG TERM GOAL #2   Title  Pt will be able to reach to her low back with min discomfort in Rt UE    Status  On-going      PT LONG TERM GOAL #3   Title  Pt will be able to increase shoulder strength to 4/5 bilateral in flexion, abduction    Status  On-going      PT LONG TERM GOAL #4   Title  Pt will be able to lift 1-2 lbs above  shoulder height with no difficulty    Status  On-going      PT LONG TERM GOAL #5   Title  FOTO score will increase to 43% or less disability to demo improvement in functional mobility.    Status  On-going            Plan - 12/12/19 1221    Clinical Impression Statement  Pt reports improvement in shoulder pain. She would like to be able to reach behind her back and head. Time spent with IR mobs, stretching and AAROM. SHe was able to touch hands together behind back at end of session with increased pain.,    PT Next Visit Plan  check HEP, AAROM to AROM, work on sit <--> supine transition.       Patient will benefit from skilled therapeutic intervention in order to improve the following deficits and impairments:  Decreased activity tolerance, Decreased range of motion, Decreased strength, Increased fascial restricitons, Impaired UE functional use, Pain, Obesity, Postural dysfunction, Impaired flexibility, Decreased mobility  Visit Diagnosis: Stiffness of left shoulder, not elsewhere classified  Stiffness of right shoulder, not elsewhere classified  Muscle weakness (generalized)  Chronic left shoulder pain  Chronic right shoulder pain     Problem List Patient Active Problem List   Diagnosis Date Noted  . Nondisplaced fracture of lateral malleolus of left fibula, initial encounter for closed fracture 10/25/2017  . Insomnia 03/17/2013  . Headache 02/23/2013  . Hx of yeast infection 04/20/2012  . Hx of menorrhagia 04/20/2012  . 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 Bipolar disorder ([place]) 04/20/2012  . Hx of menorrhagia 04/20/2012  . Hx of ovarian cyst 04/20/2012  . Epilepsy ([place]) 04/20/2012  . Hx of seizure disorder 04/20/2012  . 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 Hx of dysmenorrhea 04/20/2012  . 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 Hx of hypercholesterolemia 04/20/2012  . Hx of PMS (premenstrual syndrome) 04/20/2012  . Encounter for therapeutic drug  monitoring 05/01/2011    Dorene Ar, PTA 12/12/2019,  12:29 PM  [place], [place], [address] Phone: [phone]   Fax:  [phone]  Name: MARCIA MAROTZ MRN: [id number] Date of Birth: 1966-06-15

## 2019-12-15 ENCOUNTER — Ambulatory Visit: Payer: Medicare Other | Admitting: Physical Therapy

## 2019-12-19 ENCOUNTER — Ambulatory Visit: Payer: Medicare Other | Attending: Surgical | Admitting: Physical Therapy

## 2019-12-19 ENCOUNTER — Other Ambulatory Visit: Payer: Self-pay

## 2019-12-19 DIAGNOSIS — M25511 Pain in right shoulder: Secondary | ICD-10-CM | POA: Diagnosis present

## 2019-12-19 DIAGNOSIS — M25512 Pain in left shoulder: Secondary | ICD-10-CM | POA: Diagnosis present

## 2019-12-19 DIAGNOSIS — M25611 Stiffness of right shoulder, not elsewhere classified: Secondary | ICD-10-CM | POA: Insufficient documentation

## 2019-12-19 DIAGNOSIS — G8929 Other chronic pain: Secondary | ICD-10-CM | POA: Diagnosis present

## 2019-12-19 DIAGNOSIS — M6281 Muscle weakness (generalized): Secondary | ICD-10-CM | POA: Insufficient documentation

## 2019-12-19 DIAGNOSIS — M25612 Stiffness of left shoulder, not elsewhere classified: Secondary | ICD-10-CM | POA: Diagnosis present

## 2019-12-19 NOTE — Therapy (Signed)
[place], [place], [address] Phone: [phone]   Fax:  [phone]  Physical Therapy Treatment  Patient Details  Name: Teresa Lawrence MRN: [id number] Date of Birth: 1966/05/07 Referring Provider (PT): Dr. Marlou Sa    Encounter Date: 12/19/2019  PT End of Session - 12/19/19 1144    Visit Number  7    Number of Visits  12    Date for PT Re-Evaluation  12/27/19    Authorization Type  MCR/MCD    Authorization Time Period  10th visit PN    PT Start Time  1132    PT Stop Time  1218    PT Time Calculation (min)  46 min    Activity Tolerance  Patient tolerated treatment well    Behavior During Therapy  [facility] for tasks assessed/performed       Past Medical History:  Diagnosis Date  . Abnormal Pap smear   . Anxiety   . Bipolar disorder ([place])   . Dysmenorrhea   . Dysplasia   . Dysrhythmia    episodes of palpitations  . Dysuria   . H/O head injury   . Headache(784.0)   . High blood cholesterol   . HSV-2 infection   . Hx of hypercholesterolemia   . Hx of ovarian cyst   . Hx of seizure disorder   . Mass of ovary    left side  . Menorrhagia   . Mental retardation    mild per Dr. notes in [facility]  . Mild mental retardation   . Perimenopausal vasomotor symptoms   . PMS (premenstrual syndrome)   . PMS (premenstrual syndrome)   . PMS (premenstrual syndrome)   . RLS (restless legs syndrome)   . Seizures ([place])   . Uterine mass   . Vaginal yeast infection 2007   recurrent   . Yeast vaginitis     Past Surgical History:  Procedure Laterality Date  . ABDOMINAL HYSTERECTOMY  2013  . COLPOSCOPY    . CYSTOSCOPY Bilateral 05/30/2013   Procedure: CYSTOSCOPY;  Surgeon: Betsy Coder, MD;  Location: [facility];  Service: Gynecology;  Laterality: Bilateral;  . DILATION AND CURETTAGE OF UTERUS    . ENDOMETRIAL BIOPSY    . HYSTEROSCOPY W/ ENDOMETRIAL ABLATION    . LAPAROSCOPIC HYSTERECTOMY Right 05/30/2013    Procedure: HYSTERECTOMY TOTAL LAPAROSCOPIC;  Surgeon: Betsy Coder, MD;  Location: [facility];  Service: Gynecology;  Laterality: Right;  Right Salpingectomy  . POLYPECTOMY    . WISDOM TOOTH EXTRACTION      There were no vitals filed for this visit.  Subjective Assessment - 12/19/19 1142    Subjective  Shoulder are better.  3/10. I was real sick last week.    Currently in Pain?  Yes    Pain Score  3     Pain Location  Shoulder    Pain Orientation  Right;Left    Pain Descriptors / Indicators  Sore    Pain Type  Chronic pain    Pain Onset  More than a month ago    Pain Frequency  Intermittent            [facility] Adult PT Treatment/Exercise - 12/19/19 0001      Shoulder Exercises: Standing   Horizontal ABduction  Strengthening;Both;10 reps    Horizontal ABduction Weight (lbs)  single arm , used ball for core on high ttable     External Rotation  Right;Left;20 reps    Theraband Level (  Shoulder External Rotation)  Level 2 (Red)    Internal Rotation  Right;Left;20 reps    Theraband Level (Shoulder Internal Rotation)  Level 2 (Red)    Extension  Strengthening;Both;20 reps    Theraband Level (Shoulder Extension)  Level 2 (Red)    Row  Strengthening;Both;20 reps    Theraband Level (Shoulder Row)  Level 3 (Green)    Other Standing Exercises  flexion stretching  with ball on high table     Other Standing Exercises  opp arm opp leg lift x 10 each side for core       Shoulder Exercises: Pulleys   Flexion  3 minutes      Shoulder Exercises: ROM/Strengthening   Nustep  5 min L 4 UE and LE              PT Education - 12/19/19 1307    Education Details  posture, core    Person(s) Educated  Patient    Methods  Explanation;Demonstration    Comprehension  Verbalized understanding;Tactile cues required;Need further instruction          PT Long Term Goals - 11/28/19 1240      PT LONG TERM GOAL #1   Title  Pt will be I with HEP for bilateral shoulder ROM and strength     Status  On-going      PT LONG TERM GOAL #2   Title  Pt will be able to reach to her low back with min discomfort in Rt UE    Status  On-going      PT LONG TERM GOAL #3   Title  Pt will be able to increase shoulder strength to 4/5 bilateral in flexion, abduction    Status  On-going      PT LONG TERM GOAL #4   Title  Pt will be able to lift 1-2 lbs above shoulder height with no difficulty    Status  On-going      PT LONG TERM GOAL #5   Title  FOTO score will increase to 43% or less disability to demo improvement in functional mobility.    Status  On-going            Plan - 12/19/19 1306    Clinical Impression Statement  Patient overall increasing AROM in bilateral UE, cont to have difficulty with posture and use of upper back, scapular mm to support UEs with AROM.  Integrated standing core aa bit.  Declined modalities.    PT Treatment/Interventions  ADLs/Self Care Home Management;Iontophoresis 4mg /ml Dexamethasone;Cryotherapy;Electrical Stimulation;Moist Heat;Ultrasound;Functional mobility training;Therapeutic activities;Therapeutic exercise;Manual techniques;Passive range of motion;Neuromuscular re-education;Taping;Patient/family education    PT Next Visit Plan  AROM- strength, check AROM and MMT    PT Home Exercise Plan  wall wash, scapular retraction, ER, horizontal abd yellow band, towel squeeze,Add row, extension has red band    Consulted and Agree with Plan of Care  Patient       Patient will benefit from skilled therapeutic intervention in order to improve the following deficits and impairments:  Decreased activity tolerance, Decreased range of motion, Decreased strength, Increased fascial restricitons, Impaired UE functional use, Pain, Obesity, Postural dysfunction, Impaired flexibility, Decreased mobility  Visit Diagnosis: Stiffness of left shoulder, not elsewhere classified  Stiffness of right shoulder, not elsewhere classified  Muscle weakness  (generalized)  Chronic left shoulder pain  Chronic right shoulder pain     Problem List Patient Active Problem List   Diagnosis Date Noted  . Nondisplaced fracture of lateral  malleolus of left fibula, initial encounter for closed fracture 10/25/2017  . Insomnia 03/17/2013  . Headache 02/23/2013  . Hx of yeast infection 04/20/2012  . Hx of menorrhagia 04/20/2012  . 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 Bipolar disorder ([place]) 04/20/2012  . Hx of menorrhagia 04/20/2012  . Hx of ovarian cyst 04/20/2012  . Epilepsy ([place]) 04/20/2012  . Hx of seizure disorder 04/20/2012  . 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 Hx of dysmenorrhea 04/20/2012  . 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 Hx of hypercholesterolemia 04/20/2012  . Hx of PMS (premenstrual syndrome) 04/20/2012  . Encounter for therapeutic drug monitoring 05/01/2011    Idona Stach 12/19/2019, 1:08 PM  [place], [place], [address] Phone: [phone]   Fax:  [phone]  Name: ALPHONSO BIELAK MRN: [id number] Date of Birth: 03/15/1966  Raeford Razor, PT 12/19/19 1:10 PM Phone: [phone] Fax: [phone]

## 2019-12-21 ENCOUNTER — Ambulatory Visit: Payer: Medicare Other | Admitting: Physical Therapy

## 2019-12-21 ENCOUNTER — Other Ambulatory Visit: Payer: Self-pay

## 2019-12-21 ENCOUNTER — Encounter: Payer: Self-pay | Admitting: Physical Therapy

## 2019-12-21 DIAGNOSIS — M25512 Pain in left shoulder: Secondary | ICD-10-CM

## 2019-12-21 DIAGNOSIS — M25612 Stiffness of left shoulder, not elsewhere classified: Secondary | ICD-10-CM

## 2019-12-21 DIAGNOSIS — M25511 Pain in right shoulder: Secondary | ICD-10-CM

## 2019-12-21 DIAGNOSIS — M6281 Muscle weakness (generalized): Secondary | ICD-10-CM

## 2019-12-21 DIAGNOSIS — M25611 Stiffness of right shoulder, not elsewhere classified: Secondary | ICD-10-CM

## 2019-12-21 DIAGNOSIS — G8929 Other chronic pain: Secondary | ICD-10-CM

## 2019-12-21 NOTE — Therapy (Signed)
[facility] [address] [place], [place], [address] Phone: [phone]   Fax:  [phone]  Physical Therapy Treatment/Renewal  Patient Details  Name: Teresa Lawrence MRN: [id number] Date of Birth: 03/04/1966 Referring Provider (PT): Dr. Dean    Encounter Date: 12/21/2019  PT End of Session - 12/21/19 1238    Visit Number  8    Number of Visits  16    Date for PT Re-Evaluation  01/19/20    Authorization Type  MCR/MCD    Authorization Time Period  10th visit PN    PT Start Time  1232    PT Stop Time  1315    PT Time Calculation (min)  43 min    Activity Tolerance  Patient tolerated treatment well    Behavior During Therapy  WFL for tasks assessed/performed       Past Medical History:  Diagnosis Date  . Abnormal Pap smear   . Anxiety   . Bipolar disorder ([facility])   . Dysmenorrhea   . Dysplasia   . Dysrhythmia    episodes of palpitations  . Dysuria   . H/O head injury   . Headache(784.0)   . High blood cholesterol   . HSV-2 infection   . Hx of hypercholesterolemia   . Hx of ovarian cyst   . Hx of seizure disorder   . Mass of ovary    left side  . Menorrhagia   . Mental retardation    mild per Dr. notes in [facility]  . Mild mental retardation   . Perimenopausal vasomotor symptoms   . PMS (premenstrual syndrome)   . PMS (premenstrual syndrome)   . PMS (premenstrual syndrome)   . RLS (restless legs syndrome)   . Seizures ([facility])   . Uterine mass   . Vaginal yeast infection 2007   recurrent   . Yeast vaginitis     Past Surgical History:  Procedure Laterality Date  . ABDOMINAL HYSTERECTOMY  2013  . COLPOSCOPY    . CYSTOSCOPY Bilateral 05/30/2013   Procedure: CYSTOSCOPY;  Surgeon: Naima A Dillard, MD;  Location: [facility];  Service: Gynecology;  Laterality: Bilateral;  . DILATION AND CURETTAGE OF UTERUS    . ENDOMETRIAL BIOPSY    . HYSTEROSCOPY W/ ENDOMETRIAL ABLATION    . LAPAROSCOPIC HYSTERECTOMY Right 05/30/2013   Procedure: HYSTERECTOMY TOTAL LAPAROSCOPIC;  Surgeon: Naima A Dillard, MD;  Location: [facility];  Service: Gynecology;  Laterality: Right;  Right Salpingectomy  . POLYPECTOMY    . WISDOM TOOTH EXTRACTION      There were no vitals filed for this visit.  Subjective Assessment - 12/21/19 1236    Subjective  Reaching around to her back hurts.  No pain at rest.  Renewed for anohter 4 weeks today. PT has helped her pain.    Currently in Pain?  No/denies    Pain Score  4    end range pain flexion   Pain Location  Shoulder    Pain Orientation  Right;Left    Pain Descriptors / Indicators  Sharp    Pain Type  Chronic pain    Pain Onset  More than a month ago    Pain Frequency  Intermittent    Aggravating Factors   reaching back to her back    Pain Relieving Factors  rest         [facility] PT Assessment - 12/21/19 0001      AROM   Right Shoulder Flexion  135 Degrees      Right Shoulder Internal Rotation  --   FR to Rt hip/lumbar pain    Left Shoulder Flexion  132 Degrees    Left Shoulder Internal Rotation  --   FR to L hip/lumbar      Strength   Right Shoulder Flexion  3/5    Right Shoulder ABduction  3/5    Right Shoulder Internal Rotation  4+/5    Right Shoulder External Rotation  4/5    Left Shoulder Flexion  3/5    Left Shoulder ABduction  3/5    Left Shoulder Internal Rotation  4+/5    Left Shoulder External Rotation  3+/5    Right Hand Grip (lbs)  15    Left Hand Grip (lbs)  13           [facility] Adult PT Treatment/Exercise - 12/21/19 0001      Shoulder Exercises: Standing   External Rotation  Strengthening;Both;20 reps    External Rotation Weight (lbs)  1    Extension  Strengthening;Both;20 reps    Extension Weight (lbs)  1   with hip hinge    Other Standing Exercises  lateral raise 1 lbs x 10 each arm, forward raise bilateral 1 lbs x 10       Shoulder Exercises: Pulleys   Flexion  2 minutes    Other Pulley Exercises  IR with max cues, pain and difficulty        Shoulder Exercises: Stretch   Other Shoulder Stretches  seated posterior capsule stetch elbow on table and also cross body x 5 each              PT Education - 12/21/19 1308    Education Details  form with ther ex, ROM and strength gains, POC    Person(s) Educated  Patient    Methods  Explanation;Demonstration;Handout    Comprehension  Verbalized understanding;Returned demonstration          PT Long Term Goals - 12/21/19 1316      PT LONG TERM GOAL #1   Title  Pt will be I with HEP for bilateral shoulder ROM and strength    Status  On-going    Target Date  01/19/20      PT LONG TERM GOAL #2   Title  Pt will be able to reach to her low back with min discomfort in Rt UE    Baseline  able to do after session    Period  Weeks    Status  Partially Met      PT LONG TERM GOAL #3   Title  Pt will be able to increase shoulder strength to 4/5 bilateral in flexion, abduction    Status  On-going      PT LONG TERM GOAL #4   Title  Pt will be able to lift 1-2 lbs above shoulder height with no difficulty    Status  On-going      PT LONG TERM GOAL #5   Title  FOTO score will increase to 43% or less disability to demo improvement in functional mobility.    Baseline  45% status    Status  On-going            Plan - 12/21/19 1326    Clinical Impression Statement  Overall patient showing increased strength and ROM but still significantly impaired.  She will benefit from PT for anohter 4 weeks to improve function.  Rheumatoid Arthritis will limit use of hands and ultimately UE strength.    Given posterior capsule stretch as that seemed to improve ability to reach to low back.    Personal Factors and Comorbidities  Behavior Pattern;Comorbidity 1;Social Background;Past/Current Experience    Comorbidities  obesity,cognition, bipolar, RA    Examination-Activity Limitations  Bathing;Bed Mobility;Dressing;Self Feeding;Carry;Hygiene/Grooming    Examination-Participation Restrictions   Interpersonal Relationship;Cleaning    Stability/Clinical Decision Making  Evolving/Moderate complexity    Rehab Potential  Good    PT Frequency  2x / week    PT Duration  4 weeks    PT Treatment/Interventions  ADLs/Self Care Home Management;Iontophoresis 4mg/ml Dexamethasone;Cryotherapy;Electrical Stimulation;Moist Heat;Ultrasound;Functional mobility training;Therapeutic activities;Therapeutic exercise;Manual techniques;Passive range of motion;Neuromuscular re-education;Taping;Patient/family education    PT Next Visit Plan  Strength, dumbells, seated better than supine (use wedge)    PT Home Exercise Plan  wall wash, scapular retraction, ER, horizontal abd yellow band, towel squeeze,Add row, extension has red band    Consulted and Agree with Plan of Care  Patient       Patient will benefit from skilled therapeutic intervention in order to improve the following deficits and impairments:  Decreased activity tolerance, Decreased range of motion, Decreased strength, Increased fascial restricitons, Impaired UE functional use, Pain, Obesity, Postural dysfunction, Impaired flexibility, Decreased mobility  Visit Diagnosis: Stiffness of left shoulder, not elsewhere classified  Stiffness of right shoulder, not elsewhere classified  Muscle weakness (generalized)  Chronic left shoulder pain  Chronic right shoulder pain     Problem List Patient Active Problem List   Diagnosis Date Noted  . Nondisplaced fracture of lateral malleolus of left fibula, initial encounter for closed fracture 10/25/2017  . Insomnia 03/17/2013  . Headache 02/23/2013  . Hx of yeast infection 04/20/2012  . Hx of menorrhagia 04/20/2012  . 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 Bipolar disorder ([facility]) 04/20/2012  . Hx of menorrhagia 04/20/2012  . Hx of ovarian cyst 04/20/2012  . Epilepsy ([facility]) 04/20/2012  . Hx of seizure disorder 04/20/2012  . 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 Hx of dysmenorrhea  04/20/2012  . 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 Hx of hypercholesterolemia 04/20/2012  . Hx of PMS (premenstrual syndrome) 04/20/2012  . Encounter for therapeutic drug monitoring 05/01/2011    , 12/21/2019, 2:50 PM  [facility] [address] , [place], [address] Phone: [phone]   Fax:  [phone]  Name: Teresa Lawrence MRN: [id number] Date of Birth: 06/28/1966   , PT 12/21/19 2:50 PM Phone: [phone] Fax: [phone]  

## 2019-12-21 NOTE — Patient Instructions (Addendum)
Posterior Capsule Stretch    Stand or sit, one arm across body so hand rests over opposite shoulder. Gently push on crossed elbow with other hand until stretch is felt in shoulder of crossed arm. Hold __15-30_ seconds.  Repeat __3_ times per session. Do ___2 sessions per day.  Copyright  VHI. All rights reserved.  Internal Rotation (Active)    Bring hand behind back and across to other side. Repeat ___10_ times. Do __2. Use this to work on  getting dressed to tuck shirttail or fasten brassiere. Use a towel if you need more stretch. Copyright  VHI. All rights reserved.

## 2019-12-30 ENCOUNTER — Other Ambulatory Visit: Payer: Self-pay | Admitting: Neurology

## 2020-01-02 ENCOUNTER — Other Ambulatory Visit: Payer: Self-pay

## 2020-01-02 ENCOUNTER — Ambulatory Visit: Payer: Medicare Other | Admitting: Physical Therapy

## 2020-01-02 DIAGNOSIS — M25611 Stiffness of right shoulder, not elsewhere classified: Secondary | ICD-10-CM

## 2020-01-02 DIAGNOSIS — M25612 Stiffness of left shoulder, not elsewhere classified: Secondary | ICD-10-CM | POA: Diagnosis not present

## 2020-01-02 DIAGNOSIS — M6281 Muscle weakness (generalized): Secondary | ICD-10-CM

## 2020-01-02 DIAGNOSIS — M25512 Pain in left shoulder: Secondary | ICD-10-CM

## 2020-01-02 DIAGNOSIS — G8929 Other chronic pain: Secondary | ICD-10-CM

## 2020-01-02 NOTE — Therapy (Signed)
[place], [place], [address] Phone: [phone]   Fax:  [phone]  Physical Therapy Treatment  Patient Details  Name: Teresa Lawrence MRN: [id number] Date of Birth: November 16, 1965 Referring Provider (PT): Dr. Marlou Sa    Encounter Date: 01/02/2020  PT End of Session - 01/02/20 1222    Visit Number  9    Number of Visits  16    Date for PT Re-Evaluation  01/19/20    Authorization Type  MCR/MCD    PT Start Time  1100    PT Stop Time  1145    PT Time Calculation (min)  45 min       Past Medical History:  Diagnosis Date  . Abnormal Pap smear   . Anxiety   . Bipolar disorder ([place])   . Dysmenorrhea   . Dysplasia   . Dysrhythmia    episodes of palpitations  . Dysuria   . H/O head injury   . Headache(784.0)   . High blood cholesterol   . HSV-2 infection   . Hx of hypercholesterolemia   . Hx of ovarian cyst   . Hx of seizure disorder   . Mass of ovary    left side  . Menorrhagia   . Mental retardation    mild per Dr. notes in [facility]  . Mild mental retardation   . Perimenopausal vasomotor symptoms   . PMS (premenstrual syndrome)   . PMS (premenstrual syndrome)   . PMS (premenstrual syndrome)   . RLS (restless legs syndrome)   . Seizures ([place])   . Uterine mass   . Vaginal yeast infection 2007   recurrent   . Yeast vaginitis     Past Surgical History:  Procedure Laterality Date  . ABDOMINAL HYSTERECTOMY  2013  . COLPOSCOPY    . CYSTOSCOPY Bilateral 05/30/2013   Procedure: CYSTOSCOPY;  Surgeon: Betsy Coder, MD;  Location: [facility];  Service: Gynecology;  Laterality: Bilateral;  . DILATION AND CURETTAGE OF UTERUS    . ENDOMETRIAL BIOPSY    . HYSTEROSCOPY W/ ENDOMETRIAL ABLATION    . LAPAROSCOPIC HYSTERECTOMY Right 05/30/2013   Procedure: HYSTERECTOMY TOTAL LAPAROSCOPIC;  Surgeon: Betsy Coder, MD;  Location: [facility];  Service: Gynecology;  Laterality: Right;  Right Salpingectomy  .  POLYPECTOMY    . WISDOM TOOTH EXTRACTION      There were no vitals filed for this visit.  Subjective Assessment - 01/02/20 1223    Subjective  Pt reports she can reach behind her back better.    Currently in Pain?  No/denies                       [facility] Adult PT Treatment/Exercise - 01/02/20 0001      Shoulder Exercises: Seated   Other Seated Exercises  bicep curls 2#, 3#     Other Seated Exercises  cane pullovers seated , overhead press with cane       Shoulder Exercises: Standing   External Rotation  Right;Left;20 reps    Theraband Level (Shoulder External Rotation)  Level 1 (Yellow)    Internal Rotation  Right;Left;20 reps    Theraband Level (Shoulder Internal Rotation)  Level 1 (Yellow)    Other Standing Exercises  lateral raise 1 lbs x 10 each arm, forward raise bilateral 1 lbs x 10    2 sets each   Other Standing Exercises  UE ranger IR on floor and standing cane IR AAROM  Shoulder Exercises: Pulleys   Flexion  2 minutes      Shoulder Exercises: ROM/Strengthening   Nustep  5 min L 4 UE and LE     Other ROM/Strengthening Exercises  cabinet reaching middle shelf 1# x 10 each 2# x 10 each       Shoulder Exercises: Stretch   Internal Rotation Stretch  2 reps    Other Shoulder Stretches  seated posterior capsule stetch elbow on table and also cross body x 5 each                   PT Long Term Goals - 12/21/19 1316      PT LONG TERM GOAL #1   Title  Pt will be I with HEP for bilateral shoulder ROM and strength    Status  On-going    Target Date  01/19/20      PT LONG TERM GOAL #2   Title  Pt will be able to reach to her low back with min discomfort in Rt UE    Baseline  able to do after session    Period  Weeks    Status  Partially Met      PT LONG TERM GOAL #3   Title  Pt will be able to increase shoulder strength to 4/5 bilateral in flexion, abduction    Status  On-going      PT LONG TERM GOAL #4   Title  Pt will be able to  lift 1-2 lbs above shoulder height with no difficulty    Status  On-going      PT LONG TERM GOAL #5   Title  FOTO score will increase to 43% or less disability to demo improvement in functional mobility.    Baseline  45% status    Status  On-going            Plan - 01/02/20 1148    Clinical Impression Statement  Focused strengthening and functional tasks today with good tolerance. No c/o increased pain during session.    PT Next Visit Plan  Strength, dumbells, seated better than supine (use wedge)    PT Home Exercise Plan  wall wash, scapular retraction, ER, horizontal abd yellow band, towel squeeze,Add row, extension has red band       Patient will benefit from skilled therapeutic intervention in order to improve the following deficits and impairments:  Decreased activity tolerance, Decreased range of motion, Decreased strength, Increased fascial restricitons, Impaired UE functional use, Pain, Obesity, Postural dysfunction, Impaired flexibility, Decreased mobility  Visit Diagnosis: Stiffness of left shoulder, not elsewhere classified  Stiffness of right shoulder, not elsewhere classified  Muscle weakness (generalized)  Chronic left shoulder pain  Chronic right shoulder pain     Problem List Patient Active Problem List   Diagnosis Date Noted  . Nondisplaced fracture of lateral malleolus of left fibula, initial encounter for closed fracture 10/25/2017  . Insomnia 03/17/2013  . Headache 02/23/2013  . Hx of yeast infection 04/20/2012  . Hx of menorrhagia 04/20/2012  . 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 Bipolar disorder ([place]) 04/20/2012  . Hx of menorrhagia 04/20/2012  . Hx of ovarian cyst 04/20/2012  . Epilepsy ([place]) 04/20/2012  . Hx of seizure disorder 04/20/2012  . 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 Hx of dysmenorrhea 04/20/2012  . 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 Hx of hypercholesterolemia 04/20/2012  . Hx of PMS (premenstrual syndrome)  04/20/2012  . Encounter for therapeutic  drug monitoring 05/01/2011    Dorene Ar, PTA 01/02/2020, 12:24 PM  [facility] [address] [place], [place], [address] Phone: [phone]   Fax:  [phone]  Name: TERIN DIEROLF MRN: [id number] Date of Birth: 03/06/1966

## 2020-01-04 ENCOUNTER — Ambulatory Visit: Payer: Medicare Other | Admitting: Physical Therapy

## 2020-01-09 ENCOUNTER — Encounter: Payer: Self-pay | Admitting: Physical Therapy

## 2020-01-09 ENCOUNTER — Ambulatory Visit: Payer: Medicare Other | Admitting: Physical Therapy

## 2020-01-09 ENCOUNTER — Other Ambulatory Visit: Payer: Self-pay

## 2020-01-09 DIAGNOSIS — M25612 Stiffness of left shoulder, not elsewhere classified: Secondary | ICD-10-CM | POA: Diagnosis not present

## 2020-01-09 DIAGNOSIS — G8929 Other chronic pain: Secondary | ICD-10-CM

## 2020-01-09 DIAGNOSIS — M25512 Pain in left shoulder: Secondary | ICD-10-CM

## 2020-01-09 DIAGNOSIS — M25611 Stiffness of right shoulder, not elsewhere classified: Secondary | ICD-10-CM

## 2020-01-09 DIAGNOSIS — M6281 Muscle weakness (generalized): Secondary | ICD-10-CM

## 2020-01-09 DIAGNOSIS — M25511 Pain in right shoulder: Secondary | ICD-10-CM

## 2020-01-09 NOTE — Therapy (Signed)
[place], [place], [address] Phone: [phone]   Fax:  [phone]  Physical Therapy Treatment  Patient Details  Name: Teresa Lawrence MRN: [id number] Date of Birth: Jul 21, 1966 Referring Provider (PT): Dr. Marlou Sa    Encounter Date: 01/09/2020  PT End of Session - 01/09/20 1114    Visit Number  10    Number of Visits  16    Date for PT Re-Evaluation  01/19/20    Authorization Type  MCR/MCD    Authorization Time Period  10th visit PN    PT Start Time  1100    PT Stop Time  1145    PT Time Calculation (min)  45 min       Past Medical History:  Diagnosis Date  . Abnormal Pap smear   . Anxiety   . Bipolar disorder ([place])   . Dysmenorrhea   . Dysplasia   . Dysrhythmia    episodes of palpitations  . Dysuria   . H/O head injury   . Headache(784.0)   . High blood cholesterol   . HSV-2 infection   . Hx of hypercholesterolemia   . Hx of ovarian cyst   . Hx of seizure disorder   . Mass of ovary    left side  . Menorrhagia   . Mental retardation    mild per Dr[facility] in [facility]  . Mild mental retardation   . Perimenopausal vasomotor symptoms   . PMS (premenstrual syndrome)   . PMS (premenstrual syndrome)   . PMS (premenstrual syndrome)   . RLS (restless legs syndrome)   . Seizures ([place])   . Uterine mass   . Vaginal yeast infection 2007   recurrent   . Yeast vaginitis     Past Surgical History:  Procedure Laterality Date  . ABDOMINAL HYSTERECTOMY  2013  . COLPOSCOPY    . CYSTOSCOPY Bilateral 05/30/2013   Procedure: CYSTOSCOPY;  Surgeon: Betsy Coder, MD;  Location: [facility];  Service: Gynecology;  Laterality: Bilateral;  . DILATION AND CURETTAGE OF UTERUS    . ENDOMETRIAL BIOPSY    . HYSTEROSCOPY W/ ENDOMETRIAL ABLATION    . LAPAROSCOPIC HYSTERECTOMY Right 05/30/2013   Procedure: HYSTERECTOMY TOTAL LAPAROSCOPIC;  Surgeon: Betsy Coder, MD;  Location: [facility];  Service: Gynecology;   Laterality: Right;  Right Salpingectomy  . POLYPECTOMY    . WISDOM TOOTH EXTRACTION      There were no vitals filed for this visit.  Subjective Assessment - 01/09/20 1113    Subjective  My shoulders are sore.    Currently in Pain?  Yes    Pain Score  4     Pain Location  Shoulder    Pain Orientation  Right;Left    Pain Descriptors / Indicators  Sore    Aggravating Factors   reaching behind back    Pain Relieving Factors  rest         [facility] PT Assessment - 01/09/20 0001      AROM   Right Shoulder Flexion  134 Degrees    Right Shoulder ABduction  120 Degrees    Right Shoulder Internal Rotation  --   reach to L5   Right Shoulder External Rotation  --   FR to T2   Left Shoulder Flexion  136 Degrees    Left Shoulder ABduction  120 Degrees    Left Shoulder Internal Rotation  --   FR to L5   Left Shoulder External Rotation  --  FR to T2                  [facility] Adult PT Treatment/Exercise - 01/09/20 0001      Shoulder Exercises: Standing   External Rotation  Right;Left;20 reps    Theraband Level (Shoulder External Rotation)  Level 2 (Red)    Internal Rotation  Right;Left;20 reps    Theraband Level (Shoulder Internal Rotation)  Level 2 (Red)    Extension  Strengthening;Both;20 reps    Theraband Level (Shoulder Extension)  Level 3 (Green)    Row  Strengthening;Both;20 reps    Theraband Level (Shoulder Row)  Level 3 (Green)    Other Standing Exercises  lateral raise 1 lbs x 10 each arm, forward raise bilateral 2 lbs x 10    2 sets each   Other Standing Exercises  UE ranger IR on floor and standing cane IR AAROM        Shoulder Exercises: Pulleys   Flexion  2 minutes      Shoulder Exercises: ROM/Strengthening   UBE (Upper Arm Bike)  8 min level 1, 4 min forward and 4 min in reverse       Shoulder Exercises: Stretch   Internal Rotation Stretch  2 reps    Internal Rotation Stretch Limitations  20 sec each towel roll distraction                    PT Long Term Goals - 12/21/19 1316      PT LONG TERM GOAL #1   Title  Pt will be I with HEP for bilateral shoulder ROM and strength    Status  On-going    Target Date  01/19/20      PT LONG TERM GOAL #2   Title  Pt will be able to reach to her low back with min discomfort in Rt UE    Baseline  able to do after session    Period  Weeks    Status  Partially Met      PT LONG TERM GOAL #3   Title  Pt will be able to increase shoulder strength to 4/5 bilateral in flexion, abduction    Status  On-going      PT LONG TERM GOAL #4   Title  Pt will be able to lift 1-2 lbs above shoulder height with no difficulty    Status  On-going      PT LONG TERM GOAL #5   Title  FOTO score will increase to 43% or less disability to demo improvement in functional mobility.    Baseline  45% status    Status  On-going            Plan - 01/09/20 1142    Clinical Impression Statement  IR AROM improved. Further improved IR from L5 to L1 mid low back after mobs, Stretching and AAROM.    PT Next Visit Plan  Strength, dumbells, seated better than supine (use wedge)    PT Home Exercise Plan  wall wash, scapular retraction, ER, horizontal abd yellow band, towel squeeze,Add row, extension has red band       Patient will benefit from skilled therapeutic intervention in order to improve the following deficits and impairments:  Decreased activity tolerance, Decreased range of motion, Decreased strength, Increased fascial restricitons, Impaired UE functional use, Pain, Obesity, Postural dysfunction, Impaired flexibility, Decreased mobility  Visit Diagnosis: Stiffness of left shoulder, not elsewhere classified  Stiffness of right shoulder, not elsewhere  classified  Muscle weakness (generalized)  Chronic left shoulder pain  Chronic right shoulder pain     Problem List Patient Active Problem List   Diagnosis Date Noted  . Nondisplaced fracture of lateral malleolus of left  fibula, initial encounter for closed fracture 10/25/2017  . Insomnia 03/17/2013  . Headache 02/23/2013  . Hx of yeast infection 04/20/2012  . Hx of menorrhagia 04/20/2012  . 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 Bipolar disorder ([place]) 04/20/2012  . Hx of menorrhagia 04/20/2012  . Hx of ovarian cyst 04/20/2012  . Epilepsy ([place]) 04/20/2012  . Hx of seizure disorder 04/20/2012  . 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 Hx of dysmenorrhea 04/20/2012  . 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 Hx of hypercholesterolemia 04/20/2012  . Hx of PMS (premenstrual syndrome) 04/20/2012  . Encounter for therapeutic drug monitoring 05/01/2011    Dorene Ar, PTA 01/09/2020, 12:06 PM  [place], [place], [address] Phone: [phone]   Fax:  [phone]  Name: Teresa Lawrence MRN: [id number] Date of Birth: 08/06/1966

## 2020-01-10 ENCOUNTER — Ambulatory Visit (INDEPENDENT_AMBULATORY_CARE_PROVIDER_SITE_OTHER): Payer: Medicare Other | Admitting: Orthopedic Surgery

## 2020-01-10 DIAGNOSIS — M7502 Adhesive capsulitis of left shoulder: Secondary | ICD-10-CM | POA: Diagnosis not present

## 2020-01-10 DIAGNOSIS — M7501 Adhesive capsulitis of right shoulder: Secondary | ICD-10-CM

## 2020-01-11 ENCOUNTER — Ambulatory Visit: Payer: Medicare Other | Admitting: Physical Therapy

## 2020-01-11 ENCOUNTER — Encounter: Payer: Self-pay | Admitting: Physical Therapy

## 2020-01-11 ENCOUNTER — Other Ambulatory Visit: Payer: Self-pay

## 2020-01-11 DIAGNOSIS — M25512 Pain in left shoulder: Secondary | ICD-10-CM

## 2020-01-11 DIAGNOSIS — M25611 Stiffness of right shoulder, not elsewhere classified: Secondary | ICD-10-CM

## 2020-01-11 DIAGNOSIS — M25612 Stiffness of left shoulder, not elsewhere classified: Secondary | ICD-10-CM | POA: Diagnosis not present

## 2020-01-11 DIAGNOSIS — M6281 Muscle weakness (generalized): Secondary | ICD-10-CM

## 2020-01-11 DIAGNOSIS — G8929 Other chronic pain: Secondary | ICD-10-CM

## 2020-01-11 NOTE — Therapy (Signed)
[place], [place], [address] Phone: [phone]   Fax:  [phone]  Physical Therapy Treatment  Patient Details  Name: Teresa Lawrence MRN: [id number] Date of Birth: 1966-03-16 Referring Provider (PT): Dr. Marlou Sa    Encounter Date: 01/11/2020  PT End of Session - 01/11/20 1116    Visit Number  11    Number of Visits  16    Date for PT Re-Evaluation  01/19/20    Authorization Type  MCR/MCD    Authorization Time Period  10th visit PN    PT Start Time  1100    PT Stop Time  1138    PT Time Calculation (min)  38 min       Past Medical History:  Diagnosis Date  . Abnormal Pap smear   . Anxiety   . Bipolar disorder ([place])   . Dysmenorrhea   . Dysplasia   . Dysrhythmia    episodes of palpitations  . Dysuria   . H/O head injury   . Headache(784.0)   . High blood cholesterol   . HSV-2 infection   . Hx of hypercholesterolemia   . Hx of ovarian cyst   . Hx of seizure disorder   . Mass of ovary    left side  . Menorrhagia   . Mental retardation    mild per Dr. notes in [facility]  . Mild mental retardation   . Perimenopausal vasomotor symptoms   . PMS (premenstrual syndrome)   . PMS (premenstrual syndrome)   . PMS (premenstrual syndrome)   . RLS (restless legs syndrome)   . Seizures ([place])   . Uterine mass   . Vaginal yeast infection 2007   recurrent   . Yeast vaginitis     Past Surgical History:  Procedure Laterality Date  . ABDOMINAL HYSTERECTOMY  2013  . COLPOSCOPY    . CYSTOSCOPY Bilateral 05/30/2013   Procedure: CYSTOSCOPY;  Surgeon: Betsy Coder, MD;  Location: [facility];  Service: Gynecology;  Laterality: Bilateral;  . DILATION AND CURETTAGE OF UTERUS    . ENDOMETRIAL BIOPSY    . HYSTEROSCOPY W/ ENDOMETRIAL ABLATION    . LAPAROSCOPIC HYSTERECTOMY Right 05/30/2013   Procedure: HYSTERECTOMY TOTAL LAPAROSCOPIC;  Surgeon: Betsy Coder, MD;  Location: [facility];  Service: Gynecology;   Laterality: Right;  Right Salpingectomy  . POLYPECTOMY    . WISDOM TOOTH EXTRACTION      There were no vitals filed for this visit.  Subjective Assessment - 01/11/20 1113    Subjective  My shoulders are sore. I had cortisone injections yesterday.    Currently in Pain?  Yes    Pain Location  Shoulder    Pain Orientation  Right;Left    Pain Descriptors / Indicators  Sore    Pain Type  Chronic pain                       [facility] Adult PT Treatment/Exercise - 01/11/20 0001      Shoulder Exercises: Standing   External Rotation  Right;Left;20 reps    Theraband Level (Shoulder External Rotation)  Level 2 (Red)    Internal Rotation  Right;Left;20 reps    Theraband Level (Shoulder Internal Rotation)  Level 2 (Red)    Extension  Strengthening;Both;20 reps    Theraband Level (Shoulder Extension)  Level 3 (Green)    Row  Strengthening;Both;20 reps    Theraband Level (Shoulder Row)  Level 3 (Green)  Other Standing Exercises  lateral raise 1 lbs x 10 each arm, forward raise bilateral    2 sets each   Other Standing Exercises  UE ranger IR on floor and standing cane IR AAROM        Shoulder Exercises: Pulleys   Flexion  2 minutes      Shoulder Exercises: ROM/Strengthening   UBE (Upper Arm Bike)  8 min level 1, 4 min forward and 4 min in reverse     Other ROM/Strengthening Exercises  Bicep curls 4# x15    Other ROM/Strengthening Exercises  cabinet reaching 1 # x 15 each middle shelf                   PT Long Term Goals - 12/21/19 1316      PT LONG TERM GOAL #1   Title  Pt will be I with HEP for bilateral shoulder ROM and strength    Status  On-going    Target Date  01/19/20      PT LONG TERM GOAL #2   Title  Pt will be able to reach to her low back with min discomfort in Rt UE    Baseline  able to do after session    Period  Weeks    Status  Partially Met      PT LONG TERM GOAL #3   Title  Pt will be able to increase shoulder strength to 4/5 bilateral  in flexion, abduction    Status  On-going      PT LONG TERM GOAL #4   Title  Pt will be able to lift 1-2 lbs above shoulder height with no difficulty    Status  On-going      PT LONG TERM GOAL #5   Title  FOTO score will increase to 43% or less disability to demo improvement in functional mobility.    Baseline  45% status    Status  On-going            Plan - 01/11/20 1117    Clinical Impression Statement  Pt arrives reporting increased soreness after shoulder injections yesterday. Contnued with shoulder strengthening and ROM per POC. Able to lift 1# weights to 105 degrees bialteral  flexion. Progressing toward LTGs.    PT Next Visit Plan  Strength, dumbells, seated better than supine (use wedge)    PT Home Exercise Plan  wall wash, scapular retraction, ER, horizontal abd yellow band, towel squeeze,Add row, extension has red band       Patient will benefit from skilled therapeutic intervention in order to improve the following deficits and impairments:  Decreased activity tolerance, Decreased range of motion, Decreased strength, Increased fascial restricitons, Impaired UE functional use, Pain, Obesity, Postural dysfunction, Impaired flexibility, Decreased mobility  Visit Diagnosis: Stiffness of left shoulder, not elsewhere classified  Stiffness of right shoulder, not elsewhere classified  Muscle weakness (generalized)  Chronic left shoulder pain  Chronic right shoulder pain     Problem List Patient Active Problem List   Diagnosis Date Noted  . Nondisplaced fracture of lateral malleolus of left fibula, initial encounter for closed fracture 10/25/2017  . Insomnia 03/17/2013  . Headache 02/23/2013  . Hx of yeast infection 04/20/2012  . Hx of menorrhagia 04/20/2012  . 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 Bipolar disorder ([place]) 04/20/2012  . Hx of menorrhagia 04/20/2012  . Hx of ovarian cyst 04/20/2012  . Epilepsy ([place]) 04/20/2012  . Hx of seizure  disorder  04/20/2012  . 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 Hx of dysmenorrhea 04/20/2012  . 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 Hx of hypercholesterolemia 04/20/2012  . Hx of PMS (premenstrual syndrome) 04/20/2012  . Encounter for therapeutic drug monitoring 05/01/2011    Dorene Ar, PTA 01/11/2020, 11:36 AM  [place], [place], [address] Phone: [phone]   Fax:  [phone]  Name: JARIELYS GIRARDOT MRN: [id number] Date of Birth: Feb 23, 1966

## 2020-01-12 ENCOUNTER — Encounter: Payer: Self-pay | Admitting: Orthopedic Surgery

## 2020-01-12 DIAGNOSIS — M7501 Adhesive capsulitis of right shoulder: Secondary | ICD-10-CM

## 2020-01-12 DIAGNOSIS — M7502 Adhesive capsulitis of left shoulder: Secondary | ICD-10-CM | POA: Diagnosis not present

## 2020-01-12 MED ORDER — BUPIVACAINE HCL 0.5 % IJ SOLN
9.0000 mL | INTRAMUSCULAR | Status: AC | PRN
  Administered 2020-01-12: 9 mL via INTRA_ARTICULAR

## 2020-01-12 MED ORDER — METHYLPREDNISOLONE ACETATE 40 MG/ML IJ SUSP
40.0000 mg | INTRAMUSCULAR | Status: AC | PRN
  Administered 2020-01-12: 40 mg via INTRA_ARTICULAR

## 2020-01-12 MED ORDER — LIDOCAINE HCL 1 % IJ SOLN
5.0000 mL | INTRAMUSCULAR | Status: AC | PRN
  Administered 2020-01-12: 5 mL

## 2020-01-12 NOTE — Progress Notes (Signed)
Office Visit Note   Patient: Teresa Lawrence           Date of Birth: 1966-07-28           MRN: [id number] Visit Date: 01/10/2020 Requested by: Tamsen Roers, [place],  [place] [address] PCP: Tamsen Roers, MD  Subjective: Chief Complaint  Patient presents with  . Right Shoulder - Pain  . Left Shoulder - Pain    HPI: Teresa Lawrence is a patient with bilateral shoulder pain and adhesive capsulitis.  Here for 6-week follow-up.  In general she is doing little bit better.  The injections helped her.  She has been doing therapy as well as a home exercise program.  She can sleep okay.  Taking extra strength Tylenol.              ROS: All systems reviewed are negative as they relate to the chief complaint within the history of present illness.  Patient denies  fevers or chills.   Assessment & Plan: Visit Diagnoses:  1. Bilateral adhesive capsulitis of shoulders     Plan: Impression is bilateral adhesive capsulitis.  Plan is bilateral repeat shoulder injections with continuation of home exercise program.  Follow-up in 8 weeks for clinical recheck.  I do not think she is really reaching the surgical threshold yet but her arms and shoulders do remain pretty stiff.  No history of diabetes.  Follow-Up Instructions: Return in about 8 weeks (around 03/06/2020).   Orders:  No orders of the defined types were placed in this encounter.  No orders of the defined types were placed in this encounter.     Procedures: Large Joint Inj: bilateral glenohumeral on 01/12/2020 12:07 AM Indications: diagnostic evaluation and pain Details: 18 G 1.5 in needle, posterior approach  Arthrogram: No  Medications (Right): 5 mL lidocaine 1 %; 9 mL bupivacaine 0.5 %; 40 mg methylPREDNISolone acetate 40 MG/ML Medications (Left): 5 mL lidocaine 1 %; 9 mL bupivacaine 0.5 %; 40 mg methylPREDNISolone acetate 40 MG/ML Outcome: tolerated well, no immediate complications Procedure, treatment alternatives, risks  and benefits explained, specific risks discussed. Consent was given by the patient. Immediately prior to procedure a time out was called to verify the correct patient, procedure, equipment, support staff and site/side marked as required. Patient was prepped and draped in the usual sterile fashion.       Clinical Data: No additional findings.  Objective: Vital Signs: LMP 05/11/2013   Physical Exam:   Constitutional: Patient appears well-developed HEENT:  Head: Normocephalic Eyes:EOM are normal Neck: Normal range of motion Cardiovascular: Normal rate Pulmonary/chest: Effort normal Neurologic: Patient is alert Skin: Skin is warm Psychiatric: Patient has normal mood and affect    Ortho Exam: Ortho exam demonstrates full active and passive range of motion of the elbows and hands.  She does have restriction of external rotation of 15 degrees of abduction on both sides to about 15 to 20 degrees.  Isolated glenohumeral forward flexion elevation not really getting to 90 degrees.  Rotator cuff strength is intact.  Overall she is improved with the ease of passive motion but her range still remains restricted.  Specialty Comments:  No specialty comments available.  Imaging: No results found.   PMFS History: Patient Active Problem List   Diagnosis Date Noted  . Nondisplaced fracture of lateral malleolus of left fibula, initial encounter for closed fracture 10/25/2017  . Insomnia 03/17/2013  . Headache 02/23/2013  . Hx of yeast infection 04/20/2012  .  Hx of menorrhagia 04/20/2012  . 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 Bipolar disorder ([place]) 04/20/2012  . Hx of menorrhagia 04/20/2012  . Hx of ovarian cyst 04/20/2012  . Epilepsy ([place]) 04/20/2012  . Hx of seizure disorder 04/20/2012  . 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 Hx of dysmenorrhea 04/20/2012  . 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 Hx of hypercholesterolemia 04/20/2012  . Hx of PMS (premenstrual syndrome)  04/20/2012  . Encounter for therapeutic drug monitoring 05/01/2011   Past Medical History:  Diagnosis Date  . Abnormal Pap smear   . Anxiety   . Bipolar disorder ([place])   . Dysmenorrhea   . Dysplasia   . Dysrhythmia    episodes of palpitations  . Dysuria   . H/O head injury   . Headache(784.0)   . High blood cholesterol   . HSV-2 infection   . Hx of hypercholesterolemia   . Hx of ovarian cyst   . Hx of seizure disorder   . Mass of ovary    left side  . Menorrhagia   . Mental retardation    mild per Dr. notes in [facility]  . Mild mental retardation   . Perimenopausal vasomotor symptoms   . PMS (premenstrual syndrome)   . PMS (premenstrual syndrome)   . PMS (premenstrual syndrome)   . RLS (restless legs syndrome)   . Seizures ([place])   . Uterine mass   . Vaginal yeast infection 2007   recurrent   . Yeast vaginitis     Family History  Problem Relation Age of Onset  . Heart attack Father   . Breast cancer Sister     Past Surgical History:  Procedure Laterality Date  . ABDOMINAL HYSTERECTOMY  2013  . COLPOSCOPY    . CYSTOSCOPY Bilateral 05/30/2013   Procedure: CYSTOSCOPY;  Surgeon: Betsy Coder, MD;  Location: [facility];  Service: Gynecology;  Laterality: Bilateral;  . DILATION AND CURETTAGE OF UTERUS    . ENDOMETRIAL BIOPSY    . HYSTEROSCOPY W/ ENDOMETRIAL ABLATION    . LAPAROSCOPIC HYSTERECTOMY Right 05/30/2013   Procedure: HYSTERECTOMY TOTAL LAPAROSCOPIC;  Surgeon: Betsy Coder, MD;  Location: [facility];  Service: Gynecology;  Laterality: Right;  Right Salpingectomy  . POLYPECTOMY    . WISDOM TOOTH EXTRACTION     Social History   Occupational History  . Occupation: Unemployed  Tobacco Use  . Smoking status: Former Research scientist (life sciences)  . Smokeless tobacco: Never Used  . Tobacco comment: Quit 10 years ago.  Substance and Sexual Activity  . Alcohol use: No    Alcohol/week: 0.0 standard drinks  . Drug use: No  . Sexual activity: Not on file

## 2020-01-16 ENCOUNTER — Other Ambulatory Visit: Payer: Self-pay

## 2020-01-16 ENCOUNTER — Ambulatory Visit: Payer: Medicare Other | Admitting: Physical Therapy

## 2020-01-16 DIAGNOSIS — G8929 Other chronic pain: Secondary | ICD-10-CM

## 2020-01-16 DIAGNOSIS — M6281 Muscle weakness (generalized): Secondary | ICD-10-CM

## 2020-01-16 DIAGNOSIS — M25512 Pain in left shoulder: Secondary | ICD-10-CM

## 2020-01-16 DIAGNOSIS — M25612 Stiffness of left shoulder, not elsewhere classified: Secondary | ICD-10-CM | POA: Diagnosis not present

## 2020-01-16 DIAGNOSIS — M25611 Stiffness of right shoulder, not elsewhere classified: Secondary | ICD-10-CM

## 2020-01-16 NOTE — Addendum Note (Signed)
Addended by: Raeford Razor L on: 01/16/2020 12:15 PM   Modules accepted: Orders

## 2020-01-16 NOTE — Patient Instructions (Signed)
Resisted External Rotation: in Neutral - Bilateral    Sit or stand, tubing in both hands, elbows at sides, bent to 90, forearms forward. Pinch shoulder blades together and rotate forearms out. Keep elbows at sides. Repeat ___10_ times per set. Do __2__ sets per session. Do ____2 sessions per day.  http://orth.exer.us/967   Copyright  VHI. All rights reserved.

## 2020-01-16 NOTE — Therapy (Signed)
[facility] [place], [place], [address] Phone: [phone]   Fax:  [phone]  Physical Therapy Treatment  Patient Details  Name: Teresa Lawrence MRN: [id number] Date of Birth: July 15, 1966 Referring Provider (PT): Dr. Marlou Sa    Encounter Date: 01/16/2020  PT End of Session - 01/16/20 1129    Visit Number  12    Number of Visits  16    Date for PT Re-Evaluation  01/19/20    Authorization Type  MCR/MCD    PT Start Time  1125    PT Stop Time  1215    PT Time Calculation (min)  50 min    Activity Tolerance  Patient tolerated treatment well    Behavior During Therapy  [facility] for tasks assessed/performed       Past Medical History:  Diagnosis Date  . Abnormal Pap smear   . Anxiety   . Bipolar disorder ([place])   . Dysmenorrhea   . Dysplasia   . Dysrhythmia    episodes of palpitations  . Dysuria   . H/O head injury   . Headache(784.0)   . High blood cholesterol   . HSV-2 infection   . Hx of hypercholesterolemia   . Hx of ovarian cyst   . Hx of seizure disorder   . Mass of ovary    left side  . Menorrhagia   . Mental retardation    mild per Dr[facility] notes in [facility]  . Mild mental retardation   . Perimenopausal vasomotor symptoms   . PMS (premenstrual syndrome)   . PMS (premenstrual syndrome)   . PMS (premenstrual syndrome)   . RLS (restless legs syndrome)   . Seizures ([place])   . Uterine mass   . Vaginal yeast infection 2007   recurrent   . Yeast vaginitis     Past Surgical History:  Procedure Laterality Date  . ABDOMINAL HYSTERECTOMY  2013  . COLPOSCOPY    . CYSTOSCOPY Bilateral 05/30/2013   Procedure: CYSTOSCOPY;  Surgeon: Betsy Coder, MD;  Location: [facility];  Service: Gynecology;  Laterality: Bilateral;  . DILATION AND CURETTAGE OF UTERUS    . ENDOMETRIAL BIOPSY    . HYSTEROSCOPY W/ ENDOMETRIAL ABLATION    . LAPAROSCOPIC HYSTERECTOMY Right 05/30/2013   Procedure: HYSTERECTOMY TOTAL LAPAROSCOPIC;   Surgeon: Betsy Coder, MD;  Location: [facility];  Service: Gynecology;  Laterality: Right;  Right Salpingectomy  . POLYPECTOMY    . WISDOM TOOTH EXTRACTION      There were no vitals filed for this visit.  Subjective Assessment - 01/16/20 1151    Subjective  Shoulder do not hurt.  I am able to reach both hands behind my back.  The injections really helped.    Currently in Pain?  No/denies         [facility] PT Assessment - 01/16/20 0001      Strength   Right Shoulder Flexion  3+/5    Right Shoulder ABduction  3+/5    Right Shoulder Internal Rotation  4/5    Right Shoulder External Rotation  3+/5    Left Shoulder Flexion  3+/5    Left Shoulder ABduction  3+/5    Left Shoulder Internal Rotation  4/5    Left Shoulder External Rotation  3+/5    Right Hand Grip (lbs)  13, 10, 20 lbs     Left Hand Grip (lbs)  20, 24 20 lbs          [facility] Adult  PT Treatment/Exercise - 01/16/20 0001      Shoulder Exercises: Seated   Horizontal ABduction  Strengthening;Both;15 reps;Theraband    Theraband Level (Shoulder Horizontal ABduction)  Level 2 (Red)    External Rotation  Strengthening;Both;15 reps    Theraband Level (Shoulder External Rotation)  Level 2 (Red)    Other Seated Exercises  bicep curl 4 lbs x 20       Shoulder Exercises: Standing   External Rotation  Right;Left;20 reps    Theraband Level (Shoulder External Rotation)  Level 2 (Red)    Other Standing Exercises  2 lbs forwrd raise, lateral raise each arm       Shoulder Exercises: Pulleys   Flexion  3 minutes      Shoulder Exercises: ROM/Strengthening   Wall Pushups  15 reps    Other ROM/Strengthening Exercises  wall slides for AAROM x 10       Shoulder Exercises: Stretch   Cross Chest Stretch  2 reps;30 seconds    Wall Stretch - Flexion  2 reps;30 seconds    Wall Stretch - Flexion Limitations  useds strap with knot in doorway, grip too weak for this     Wall Stretch - ABduction  2 reps;30 seconds    Wall Stretch - ABduction  Limitations  used strap as above for traction stretch              PT Education - 01/16/20 1152    Education Details  improvements, grip strength, renewal for 4 more weeks, donning bra    Person(s) Educated  Patient    Methods  Explanation    Comprehension  Verbalized understanding          PT Long Term Goals - 01/16/20 1154      PT LONG TERM GOAL #1   Title  Pt will be I with HEP for bilateral shoulder ROM and strength      PT LONG TERM GOAL #2   Title  Pt will be able to reach to her low back with min discomfort in Rt UE    Baseline  can do this today    Status  Achieved      PT LONG TERM GOAL #3   Title  Pt will be able to increase shoulder strength to 4/5 bilateral in flexion, abduction    Baseline  4-/5    Status  On-going      PT LONG TERM GOAL #4   Title  Pt will be able to lift 1-2 lbs above shoulder height with no difficulty    Status  On-going      PT LONG TERM GOAL #5   Title  FOTO score will increase to 43% or less disability to demo improvement in functional mobility.    Status  On-going      Additional Long Term Goals   Additional Long Term Goals  Yes      PT LONG TERM GOAL #6   Title  Pt will be able to put her bra on (reaching back) without assist from her mother.    Baseline  needs assist, shown how to do in front first    Time  4    Period  Weeks    Status  New    Target Date  02/16/20            Plan - 01/16/20 1207    Clinical Impression Statement  Pt with less pain due to injection.  Strength improved in all planes.  Working towards goals.  Now that pain is iproved we will focus on strengthening to gain more function and Independence  in her home.    PT Frequency  2x / week    PT Duration  4 weeks    PT Treatment/Interventions  ADLs/Self Care Home Management;Iontophoresis 4mg /ml Dexamethasone;Cryotherapy;Electrical Stimulation;Moist Heat;Ultrasound;Functional mobility training;Therapeutic activities;Therapeutic exercise;Manual  techniques;Passive range of motion;Neuromuscular re-education;Taping;Patient/family education    PT Next Visit Plan  Strength, dumbells, seated better than supine (use wedge)    PT Home Exercise Plan  wall wash, scapular retraction, ER, horizontal abd yellow band, towel squeeze,Add row, extension has red band    Consulted and Agree with Plan of Care  Patient       Patient will benefit from skilled therapeutic intervention in order to improve the following deficits and impairments:  Decreased activity tolerance, Decreased range of motion, Decreased strength, Increased fascial restricitons, Impaired UE functional use, Pain, Obesity, Postural dysfunction, Impaired flexibility, Decreased mobility  Visit Diagnosis: Stiffness of left shoulder, not elsewhere classified  Stiffness of right shoulder, not elsewhere classified  Muscle weakness (generalized)  Chronic left shoulder pain  Chronic right shoulder pain     Problem List Patient Active Problem List   Diagnosis Date Noted  . Nondisplaced fracture of lateral malleolus of left fibula, initial encounter for closed fracture 10/25/2017  . Insomnia 03/17/2013  . Headache 02/23/2013  . Hx of yeast infection 04/20/2012  . Hx of menorrhagia 04/20/2012  . 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 Bipolar disorder ([place]) 04/20/2012  . Hx of menorrhagia 04/20/2012  . Hx of ovarian cyst 04/20/2012  . Epilepsy ([place]) 04/20/2012  . Hx of seizure disorder 04/20/2012  . 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 Hx of dysmenorrhea 04/20/2012  . 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 Hx of hypercholesterolemia 04/20/2012  . Hx of PMS (premenstrual syndrome) 04/20/2012  . Encounter for therapeutic drug monitoring 05/01/2011    Winn Muehl 01/16/2020, 12:12 PM  [facility] [place], [place], [address] Phone: [phone]   Fax:  [phone]  Name: SESHA SZEWCZYK MRN:  [id number] Date of Birth: [birth year]  Raeford Razor, PT 01/16/20 12:12 PM Phone: [phone] Fax: [phone]

## 2020-01-18 ENCOUNTER — Other Ambulatory Visit: Payer: Self-pay

## 2020-01-18 ENCOUNTER — Ambulatory Visit: Payer: Medicare Other | Attending: Orthopedic Surgery | Admitting: Physical Therapy

## 2020-01-18 ENCOUNTER — Encounter: Payer: Self-pay | Admitting: Physical Therapy

## 2020-01-18 DIAGNOSIS — M25612 Stiffness of left shoulder, not elsewhere classified: Secondary | ICD-10-CM | POA: Diagnosis not present

## 2020-01-18 DIAGNOSIS — M25511 Pain in right shoulder: Secondary | ICD-10-CM | POA: Insufficient documentation

## 2020-01-18 DIAGNOSIS — M6281 Muscle weakness (generalized): Secondary | ICD-10-CM | POA: Diagnosis present

## 2020-01-18 DIAGNOSIS — M25512 Pain in left shoulder: Secondary | ICD-10-CM | POA: Diagnosis present

## 2020-01-18 DIAGNOSIS — G8929 Other chronic pain: Secondary | ICD-10-CM | POA: Insufficient documentation

## 2020-01-18 DIAGNOSIS — M25611 Stiffness of right shoulder, not elsewhere classified: Secondary | ICD-10-CM | POA: Insufficient documentation

## 2020-01-18 NOTE — Therapy (Signed)
[facility] [place], [place], [address] Phone: [phone]   Fax:  [phone]  Physical Therapy Treatment  Patient Details  Name: Teresa Lawrence MRN: [id number] Date of Birth: [birth year] Referring Provider (PT): Dr. Marlou Sa    Encounter Date: 01/18/2020  PT End of Session - 01/18/20 1230    Visit Number  13    Date for PT Re-Evaluation  02/16/20    Authorization Type  MCR/MCD    Authorization Time Period  10th visit PN    PT Start Time  1221    PT Stop Time  1300    PT Time Calculation (min)  39 min    Activity Tolerance  Patient tolerated treatment well    Behavior During Therapy  [facility] for tasks assessed/performed       Past Medical History:  Diagnosis Date  . Abnormal Pap smear   . Anxiety   . Bipolar disorder ([place])   . Dysmenorrhea   . Dysplasia   . Dysrhythmia    episodes of palpitations  . Dysuria   . H/O head injury   . Headache(784.0)   . High blood cholesterol   . HSV-2 infection   . Hx of hypercholesterolemia   . Hx of ovarian cyst   . Hx of seizure disorder   . Mass of ovary    left side  . Menorrhagia   . Mental retardation    mild per Dr. notes in [facility]  . Mild mental retardation   . Perimenopausal vasomotor symptoms   . PMS (premenstrual syndrome)   . PMS (premenstrual syndrome)   . PMS (premenstrual syndrome)   . RLS (restless legs syndrome)   . Seizures ([place])   . Uterine mass   . Vaginal yeast infection 2007   recurrent   . Yeast vaginitis     Past Surgical History:  Procedure Laterality Date  . ABDOMINAL HYSTERECTOMY  2013  . COLPOSCOPY    . CYSTOSCOPY Bilateral 05/30/2013   Procedure: CYSTOSCOPY;  Surgeon: Betsy Coder, MD;  Location: [facility];  Service: Gynecology;  Laterality: Bilateral;  . DILATION AND CURETTAGE OF UTERUS    . ENDOMETRIAL BIOPSY    . HYSTEROSCOPY W/ ENDOMETRIAL ABLATION    . LAPAROSCOPIC HYSTERECTOMY Right 05/30/2013   Procedure: HYSTERECTOMY TOTAL  LAPAROSCOPIC;  Surgeon: Betsy Coder, MD;  Location: [facility];  Service: Gynecology;  Laterality: Right;  Right Salpingectomy  . POLYPECTOMY    . WISDOM TOOTH EXTRACTION      There were no vitals filed for this visit.  Subjective Assessment - 01/18/20 1227    Subjective  I was able to reach behind me in the car and unlock the door.  I feel sluggish today.    Currently in Pain?  No/denies        [facility] Adult PT Treatment/Exercise - 01/18/20 0001      Shoulder Exercises: Seated   External Rotation Weight (lbs)  3 lbs x 20     Flexion Weight (lbs)  3 lbs punch (alt. ) x 10     ABduction Weight (lbs)  3 lbs x 10 (scaption)     Other Seated Exercises  hammer curl 3 lbs x 20       Shoulder Exercises: Standing   Horizontal ABduction  Strengthening;Both;10 reps    Theraband Level (Shoulder Horizontal ABduction)  Level 1 (Yellow)    Extension  Strengthening;Both;15 reps;Theraband    Theraband Level (Shoulder Extension)  Level 2 (Red)  Row  Strengthening;Both;20 reps    Theraband Level (Shoulder Row)  Level 2 (Red)    Diagonals  Strengthening;Both;10 reps    Theraband Level (Shoulder Diagonals)  Level 2 (Red)      Shoulder Exercises: ROM/Strengthening   UBE (Upper Arm Bike)  5 min L1 reverse cues for increasing    [place]  internal rotation x 10 each UE , extension + IR x 10 and then ext only x 10                   PT Long Term Goals - 01/18/20 1253      PT LONG TERM GOAL #1   Title  Pt will be I with HEP for bilateral shoulder ROM and strength    Status  On-going      PT LONG TERM GOAL #2   Title  Pt will be able to reach to her low back with min discomfort in Rt UE    Status  Achieved      PT LONG TERM GOAL #3   Title  Pt will be able to increase shoulder strength to 4/5 bilateral in flexion, abduction    Status  On-going      PT LONG TERM GOAL #4   Title  Pt will be able to lift 1-2 lbs above shoulder height with no difficulty    Status  On-going      PT  LONG TERM GOAL #5   Title  FOTO score will increase to 43% or less disability to demo improvement in functional mobility.            Plan - 01/18/20 1317    Clinical Impression Statement  Focused on strengthening today for bilateral shoulders. Needs cues for technique and posture throughout session.  Scapular elevation with reaching back.  Cont to strengthen and work within limits of pain.    PT Treatment/Interventions  ADLs/Self Care Home Management;Iontophoresis 4mg /ml Dexamethasone;Cryotherapy;Electrical Stimulation;Moist Heat;Ultrasound;Functional mobility training;Therapeutic activities;Therapeutic exercise;Manual techniques;Passive range of motion;Neuromuscular re-education;Taping;Patient/family education    PT Next Visit Plan  Strength, dumbells, seated better than supine (use wedge)    PT Home Exercise Plan  wall wash, scapular retraction, ER, horizontal abd yellow band, towel squeeze,Add row, extension has red band    Consulted and Agree with Plan of Care  Patient       Patient will benefit from skilled therapeutic intervention in order to improve the following deficits and impairments:  Decreased activity tolerance, Decreased range of motion, Decreased strength, Increased fascial restricitons, Impaired UE functional use, Pain, Obesity, Postural dysfunction, Impaired flexibility, Decreased mobility  Visit Diagnosis: Stiffness of left shoulder, not elsewhere classified  Stiffness of right shoulder, not elsewhere classified  Muscle weakness (generalized)  Chronic left shoulder pain  Chronic right shoulder pain     Problem List Patient Active Problem List   Diagnosis Date Noted  . Nondisplaced fracture of lateral malleolus of left fibula, initial encounter for closed fracture 10/25/2017  . Insomnia 03/17/2013  . Headache 02/23/2013  . Hx of yeast infection 04/20/2012  . Hx of menorrhagia 04/20/2012  . 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 Bipolar  disorder ([place]) 04/20/2012  . Hx of menorrhagia 04/20/2012  . Hx of ovarian cyst 04/20/2012  . Epilepsy ([place]) 04/20/2012  . Hx of seizure disorder 04/20/2012  . 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 Hx of dysmenorrhea 04/20/2012  . 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 Hx of hypercholesterolemia 04/20/2012  . Hx of PMS (premenstrual  syndrome) 04/20/2012  . Encounter for therapeutic drug monitoring 05/01/2011    Laquinta Hazell 01/18/2020, 1:51 PM  [facility] [place], [place], [address] Phone: [phone]   Fax:  [phone]  Name: SCOTT MALLICOAT MRN: [id number] Date of Birth: July 18, 1966  Raeford Razor, PT 01/18/20 1:51 PM Phone: [phone] Fax: [phone]

## 2020-01-23 ENCOUNTER — Other Ambulatory Visit: Payer: Self-pay

## 2020-01-23 ENCOUNTER — Ambulatory Visit: Payer: Medicare Other | Admitting: Physical Therapy

## 2020-01-23 ENCOUNTER — Encounter: Payer: Self-pay | Admitting: Physical Therapy

## 2020-01-23 DIAGNOSIS — G8929 Other chronic pain: Secondary | ICD-10-CM

## 2020-01-23 DIAGNOSIS — M25612 Stiffness of left shoulder, not elsewhere classified: Secondary | ICD-10-CM | POA: Diagnosis not present

## 2020-01-23 DIAGNOSIS — M25611 Stiffness of right shoulder, not elsewhere classified: Secondary | ICD-10-CM

## 2020-01-23 DIAGNOSIS — M6281 Muscle weakness (generalized): Secondary | ICD-10-CM

## 2020-01-23 NOTE — Therapy (Signed)
[place], [place], [address] Phone: [phone]   Fax:  [phone]  Physical Therapy Treatment  Patient Details  Name: Teresa Lawrence MRN: [id number] Date of Birth: [birth year] Referring Provider (PT): Dr. Marlou Sa    Encounter Date: 01/23/2020  PT End of Session - 01/23/20 1142    Visit Number  14    Date for PT Re-Evaluation  02/16/20    Authorization Type  MCR/MCD    Authorization Time Period  10th visit PN    PT Start Time  1134    PT Stop Time  1214    PT Time Calculation (min)  40 min    Activity Tolerance  Patient tolerated treatment well    Behavior During Therapy  [facility] for tasks assessed/performed       Past Medical History:  Diagnosis Date  . Abnormal Pap smear   . Anxiety   . Bipolar disorder ([place])   . Dysmenorrhea   . Dysplasia   . Dysrhythmia    episodes of palpitations  . Dysuria   . H/O head injury   . Headache(784.0)   . High blood cholesterol   . HSV-2 infection   . Hx of hypercholesterolemia   . Hx of ovarian cyst   . Hx of seizure disorder   . Mass of ovary    left side  . Menorrhagia   . Mental retardation    mild per Dr. notes in [facility]  . Mild mental retardation   . Perimenopausal vasomotor symptoms   . PMS (premenstrual syndrome)   . PMS (premenstrual syndrome)   . PMS (premenstrual syndrome)   . RLS (restless legs syndrome)   . Seizures ([place])   . Uterine mass   . Vaginal yeast infection 2007   recurrent   . Yeast vaginitis     Past Surgical History:  Procedure Laterality Date  . ABDOMINAL HYSTERECTOMY  2013  . COLPOSCOPY    . CYSTOSCOPY Bilateral 05/30/2013   Procedure: CYSTOSCOPY;  Surgeon: Betsy Coder, MD;  Location: [facility];  Service: Gynecology;  Laterality: Bilateral;  . DILATION AND CURETTAGE OF UTERUS    . ENDOMETRIAL BIOPSY    . HYSTEROSCOPY W/ ENDOMETRIAL ABLATION    . LAPAROSCOPIC HYSTERECTOMY Right 05/30/2013   Procedure: HYSTERECTOMY TOTAL  LAPAROSCOPIC;  Surgeon: Betsy Coder, MD;  Location: [facility];  Service: Gynecology;  Laterality: Right;  Right Salpingectomy  . POLYPECTOMY    . WISDOM TOOTH EXTRACTION      There were no vitals filed for this visit.  Subjective Assessment - 01/23/20 1140    Subjective  I can tie my hair up and hook my seatbelt now. No pain.    Currently in Pain?  No/denies        [facility] Adult PT Treatment/Exercise - 01/23/20 0001      Shoulder Exercises: Standing   Horizontal ABduction  Strengthening;Both;15 reps    Theraband Level (Shoulder Horizontal ABduction)  Level 2 (Red)    External Rotation  Right;Left;15 reps    Theraband Level (Shoulder External Rotation)  Level 2 (Red)    External Rotation Weight (lbs)  unattached     Flexion  Strengthening;Both;12 reps    Theraband Level (Shoulder Flexion)  Level 1 (Yellow)    Shoulder Flexion Weight (lbs)  single arm     Row Weight (lbs)  push/pull 3 lbs dumbbells x 15     Other Standing Exercises  bicep curl red bandx 15  Shoulder Exercises: Pulleys   Flexion  3 minutes    Scaption  2 minutes    Other Pulley Exercises  horizontal abd x 15 standing       Shoulder Exercises: ROM/Strengthening   Nustep  5 min UE and LE focus on UE     Wall Pushups  15 reps    Wall Pushups Limitations  protract/retract x 10     Ball on Wall  small redball 30 sec x 2 each for endurance       Shoulder Exercises: Stretch   Other Shoulder Stretches  post capsule stretch x 2 each side                   PT Long Term Goals - 01/18/20 1253      PT LONG TERM GOAL #1   Title  Pt will be I with HEP for bilateral shoulder ROM and strength    Status  On-going      PT LONG TERM GOAL #2   Title  Pt will be able to reach to her low back with min discomfort in Rt UE    Status  Achieved      PT LONG TERM GOAL #3   Title  Pt will be able to increase shoulder strength to 4/5 bilateral in flexion, abduction    Status  On-going      PT LONG TERM GOAL #4    Title  Pt will be able to lift 1-2 lbs above shoulder height with no difficulty    Status  On-going      PT LONG TERM GOAL #5   Title  FOTO score will increase to 43% or less disability to demo improvement in functional mobility.            Plan - 01/23/20 1215    Clinical Impression Statement  Patient did well with strengthening today, no increase in pain.  Able to show more functional use of bilateral UEs for dressing , grooming.    PT Treatment/Interventions  ADLs/Self Care Home Management;Iontophoresis 4mg /ml Dexamethasone;Cryotherapy;Electrical Stimulation;Moist Heat;Ultrasound;Functional mobility training;Therapeutic activities;Therapeutic exercise;Manual techniques;Passive range of motion;Neuromuscular re-education;Taping;Patient/family education    PT Next Visit Plan  Strength, dumbells, seated better than supine (use wedge)    PT Home Exercise Plan  wall wash, scapular retraction, ER, horizontal abd yellow band, towel squeeze,Add row, extension has red band    Consulted and Agree with Plan of Care  Patient       Patient will benefit from skilled therapeutic intervention in order to improve the following deficits and impairments:  Decreased activity tolerance, Decreased range of motion, Decreased strength, Increased fascial restricitons, Impaired UE functional use, Pain, Obesity, Postural dysfunction, Impaired flexibility, Decreased mobility  Visit Diagnosis: Stiffness of left shoulder, not elsewhere classified  Stiffness of right shoulder, not elsewhere classified  Chronic left shoulder pain  Muscle weakness (generalized)  Chronic right shoulder pain     Problem List Patient Active Problem List   Diagnosis Date Noted  . Nondisplaced fracture of lateral malleolus of left fibula, initial encounter for closed fracture 10/25/2017  . Insomnia 03/17/2013  . Headache 02/23/2013  . Hx of yeast infection 04/20/2012  . Hx of menorrhagia 04/20/2012  . HSV-2 (herpes  simplex virus 2) infection 04/20/2012  . Bipolar disorder ([place]) 04/20/2012  . Hx of menorrhagia 04/20/2012  . Hx of ovarian cyst 04/20/2012  . Epilepsy ([place]) 04/20/2012  . Hx of seizure disorder 04/20/2012  . 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  Hx of dysmenorrhea 04/20/2012  . 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 Hx of hypercholesterolemia 04/20/2012  . Hx of PMS (premenstrual syndrome) 04/20/2012  . Encounter for therapeutic drug monitoring 05/01/2011    Teresa Lawrence 01/23/2020, 12:16 PM  [place], [place], [address] Phone: [phone]   Fax:  [phone]  Name: Teresa Lawrence MRN: [id number] Date of Birth: [birth year]  Raeford Razor, PT 01/23/20 12:16 PM Phone: [phone] Fax: [phone]

## 2020-01-25 ENCOUNTER — Other Ambulatory Visit: Payer: Self-pay

## 2020-01-25 ENCOUNTER — Encounter: Payer: Self-pay | Admitting: Physical Therapy

## 2020-01-25 ENCOUNTER — Ambulatory Visit: Payer: Medicare Other | Admitting: Physical Therapy

## 2020-01-25 DIAGNOSIS — G8929 Other chronic pain: Secondary | ICD-10-CM

## 2020-01-25 DIAGNOSIS — M25612 Stiffness of left shoulder, not elsewhere classified: Secondary | ICD-10-CM | POA: Diagnosis not present

## 2020-01-25 DIAGNOSIS — M25512 Pain in left shoulder: Secondary | ICD-10-CM

## 2020-01-25 DIAGNOSIS — M6281 Muscle weakness (generalized): Secondary | ICD-10-CM

## 2020-01-25 DIAGNOSIS — M25611 Stiffness of right shoulder, not elsewhere classified: Secondary | ICD-10-CM

## 2020-01-25 NOTE — Therapy (Signed)
[place], [place], [address] Phone: [phone]   Fax:  [phone]  Physical Therapy Treatment  Patient Details  Name: Teresa Lawrence MRN: [id number] Date of Birth: [birth year] Referring Provider (PT): Dr. Marlou Sa    Encounter Date: 01/25/2020  PT End of Session - 01/25/20 1109    Visit Number  15    Number of Visits  16    Date for PT Re-Evaluation  02/16/20    Authorization Type  MCR/MCD    PT Start Time  1100    PT Stop Time  1145    PT Time Calculation (min)  45 min       Past Medical History:  Diagnosis Date  . Abnormal Pap smear   . Anxiety   . Bipolar disorder ([place])   . Dysmenorrhea   . Dysplasia   . Dysrhythmia    episodes of palpitations  . Dysuria   . H/O head injury   . Headache(784.0)   . High blood cholesterol   . HSV-2 infection   . Hx of hypercholesterolemia   . Hx of ovarian cyst   . Hx of seizure disorder   . Mass of ovary    left side  . Menorrhagia   . Mental retardation    mild per Dr. notes in [facility]  . Mild mental retardation   . Perimenopausal vasomotor symptoms   . PMS (premenstrual syndrome)   . PMS (premenstrual syndrome)   . PMS (premenstrual syndrome)   . RLS (restless legs syndrome)   . Seizures ([place])   . Uterine mass   . Vaginal yeast infection 2007   recurrent   . Yeast vaginitis     Past Surgical History:  Procedure Laterality Date  . ABDOMINAL HYSTERECTOMY  2013  . COLPOSCOPY    . CYSTOSCOPY Bilateral 05/30/2013   Procedure: CYSTOSCOPY;  Surgeon: Betsy Coder, MD;  Location: [facility];  Service: Gynecology;  Laterality: Bilateral;  . DILATION AND CURETTAGE OF UTERUS    . ENDOMETRIAL BIOPSY    . HYSTEROSCOPY W/ ENDOMETRIAL ABLATION    . LAPAROSCOPIC HYSTERECTOMY Right 05/30/2013   Procedure: HYSTERECTOMY TOTAL LAPAROSCOPIC;  Surgeon: Betsy Coder, MD;  Location: [facility];  Service: Gynecology;  Laterality: Right;  Right Salpingectomy  .  POLYPECTOMY    . WISDOM TOOTH EXTRACTION      There were no vitals filed for this visit.  Subjective Assessment - 01/25/20 1107    Subjective  Shoulders are still not quite there.    Currently in Pain?  Yes    Pain Score  3     Pain Location  Shoulder    Pain Orientation  Right;Left    Pain Descriptors / Indicators  Sore    Pain Type  Chronic pain    Aggravating Factors   reahing behind back end range stretches    Pain Relieving Factors  rest         [facility] PT Assessment - 01/25/20 0001      AROM   Right Shoulder Flexion  144 Degrees    Right Shoulder ABduction  134 Degrees    Right Shoulder Internal Rotation  --   Reach to L1   Right Shoulder External Rotation  --   FR to T2   Left Shoulder Flexion  144 Degrees    Left Shoulder ABduction  144 Degrees    Left Shoulder Internal Rotation  --   FR to L1   Left  Shoulder External Rotation  --   FR to T2                  [facility] Adult PT Treatment/Exercise - 01/25/20 0001      Shoulder Exercises: Supine   Other Supine Exercises  inclined on wedge- pullovers , yellowband Er, yelllow horizontal abduction x 15 each - pt was able to roll to side and sit up independently today      Shoulder Exercises: Standing   Horizontal ABduction  Strengthening;Both;15 reps    Theraband Level (Shoulder Horizontal ABduction)  Level 2 (Red)    External Rotation  Right;Left;15 reps    Theraband Level (Shoulder External Rotation)  Level 2 (Red)    External Rotation Weight (lbs)  unattached     ABduction Limitations  scaption red band x 10  each     Extension  Strengthening;Both;20 reps    Theraband Level (Shoulder Extension)  Level 3 (Green)    Row  Strengthening;Both;20 reps    Theraband Level (Shoulder Row)  Level 3 (Green)    Diagonals  Strengthening;Both;10 reps    Theraband Level (Shoulder Diagonals)  Level 2 (Red)    Other Standing Exercises  bicep curl red bandx 15       Shoulder Exercises: ROM/Strengthening   Nustep  UBE  level 3 3 min each way                   PT Long Term Goals - 01/18/20 1253      PT LONG TERM GOAL #1   Title  Pt will be I with HEP for bilateral shoulder ROM and strength    Status  On-going      PT LONG TERM GOAL #2   Title  Pt will be able to reach to her low back with min discomfort in Rt UE    Status  Achieved      PT LONG TERM GOAL #3   Title  Pt will be able to increase shoulder strength to 4/5 bilateral in flexion, abduction    Status  On-going      PT LONG TERM GOAL #4   Title  Pt will be able to lift 1-2 lbs above shoulder height with no difficulty    Status  On-going      PT LONG TERM GOAL #5   Title  FOTO score will increase to 43% or less disability to demo improvement in functional mobility.            Plan - 01/25/20 1109    Clinical Impression Statement  Pt reports she cannot clasp her bra in the back despite the instruction she received last visit. Discussed the possibilyt of shopping for bras with front zippers/ high impact sports bras with zipper fronts. She was agreeable. Her AROM has improved in all planes. She still reports difficulty with any reaching back activity.    Examination-Activity Limitations  Bathing;Bed Mobility;Dressing;Self Feeding;Carry;Hygiene/Grooming    PT Treatment/Interventions  ADLs/Self Care Home Management;Iontophoresis 4mg /ml Dexamethasone;Cryotherapy;Electrical Stimulation;Moist Heat;Ultrasound;Functional mobility training;Therapeutic activities;Therapeutic exercise;Manual techniques;Passive range of motion;Neuromuscular re-education;Taping;Patient/family education    PT Next Visit Plan  Strength, dumbells, seated better than supine (use wedge)    PT Home Exercise Plan  wall wash, scapular retraction, ER, horizontal abd yellow band, towel squeeze,Add row, extension has red band       Patient will benefit from skilled therapeutic intervention in order to improve the following deficits and impairments:  Decreased  activity tolerance, Decreased range of  motion, Decreased strength, Increased fascial restricitons, Impaired UE functional use, Pain, Obesity, Postural dysfunction, Impaired flexibility, Decreased mobility  Visit Diagnosis: Stiffness of left shoulder, not elsewhere classified  Stiffness of right shoulder, not elsewhere classified  Chronic left shoulder pain  Muscle weakness (generalized)  Chronic right shoulder pain     Problem List Patient Active Problem List   Diagnosis Date Noted  . Nondisplaced fracture of lateral malleolus of left fibula, initial encounter for closed fracture 10/25/2017  . Insomnia 03/17/2013  . Headache 02/23/2013  . Hx of yeast infection 04/20/2012  . Hx of menorrhagia 04/20/2012  . 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 Bipolar disorder ([place]) 04/20/2012  . Hx of menorrhagia 04/20/2012  . Hx of ovarian cyst 04/20/2012  . Epilepsy ([place]) 04/20/2012  . Hx of seizure disorder 04/20/2012  . 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 Hx of dysmenorrhea 04/20/2012  . 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 Hx of hypercholesterolemia 04/20/2012  . Hx of PMS (premenstrual syndrome) 04/20/2012  . Encounter for therapeutic drug monitoring 05/01/2011    Dorene Ar, PTA 01/25/2020, 11:51 AM  [place], [place], [address] Phone: [phone]   Fax:  [phone]  Name: Teresa Lawrence MRN: [id number] Date of Birth: 1965-11-21

## 2020-01-26 ENCOUNTER — Ambulatory Visit
Admission: EM | Admit: 2020-01-26 | Discharge: 2020-01-26 | Disposition: A | Discharge status: Home / Self Care | Payer: Medicare Other | Attending: Emergency Medicine | Admitting: Emergency Medicine

## 2020-01-26 DIAGNOSIS — N3090 Cystitis, unspecified without hematuria: Secondary | ICD-10-CM | POA: Diagnosis not present

## 2020-01-26 DIAGNOSIS — B9689 Other specified bacterial agents as the cause of diseases classified elsewhere: Secondary | ICD-10-CM | POA: Diagnosis not present

## 2020-01-26 LAB — POCT URINALYSIS DIP (MANUAL ENTRY)
Bilirubin, UA: NEGATIVE
Glucose, UA: NEGATIVE mg/dL
Ketones, POC UA: NEGATIVE mg/dL
Leukocytes, UA: NEGATIVE
Nitrite, UA: NEGATIVE
Protein Ur, POC: 30 mg/dL — AB
Spec Grav, UA: 1.025 (ref 1.010–1.025)
Urobilinogen, UA: 0.2 E.U./dL
pH, UA: 7 (ref 5.0–8.0)

## 2020-01-26 LAB — POCT FASTING CBG KUC MANUAL ENTRY: POCT Glucose (KUC): 86 mg/dL (ref 70–99)

## 2020-01-26 MED ORDER — CEPHALEXIN 500 MG PO CAPS: 500.0000 mg | ORAL_CAPSULE | Freq: Two times a day (BID) | ORAL | 0 refills | Status: AC

## 2020-01-26 NOTE — Discharge Instructions (Signed)
Take antibiotic as directed with food. Follow-up with primary care for further evaluation if your symptoms are persisting. Return to urgent care for worsening pain, blood in urine, fever, back or belly pain.

## 2020-01-26 NOTE — ED Provider Notes (Signed)
[name]    CSN: [id number] Arrival date & time: 01/26/20  1007      History   Chief Complaint Chief Complaint  Patient presents with  . Urinary Tract Infection    HPI Teresa Lawrence is a 54 y.o. female with history of seizures, uterine mass, ovarian cyst, bipolar disorder presenting for 1 week course of urinary burning, frequency, urgency.  Patient denying hematuria, abdominal or pelvic pain, back pain, fever.  No vaginal discharge or pruritus.  Patient denies change in medication, food.  Denies vaginal dryness.  Not sexually active: Denies concern for STI.  Patient does endorse feeling more thirsty than usual: No polyphagia, polyuria.    Past Medical History:  Diagnosis Date  . Abnormal Pap smear   . Anxiety   . Bipolar disorder ([place])   . Dysmenorrhea   . Dysplasia   . Dysrhythmia    episodes of palpitations  . Dysuria   . H/O head injury   . Headache(784.0)   . High blood cholesterol   . HSV-2 infection   . Hx of hypercholesterolemia   . Hx of ovarian cyst   . Hx of seizure disorder   . Mass of ovary    left side  . Menorrhagia   . Mental retardation    mild per Dr. notes in [facility]  . Mild mental retardation   . Perimenopausal vasomotor symptoms   . PMS (premenstrual syndrome)   . PMS (premenstrual syndrome)   . PMS (premenstrual syndrome)   . RLS (restless legs syndrome)   . Seizures ([place])   . Uterine mass   . Vaginal yeast infection 2007   recurrent   . Yeast vaginitis     Patient Active Problem List   Diagnosis Date Noted  . Nondisplaced fracture of lateral malleolus of left fibula, initial encounter for closed fracture 10/25/2017  . Insomnia 03/17/2013  . Headache 02/23/2013  . Hx of yeast infection 04/20/2012  . Hx of menorrhagia 04/20/2012  . 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 Bipolar disorder ([place]) 04/20/2012  . Hx of menorrhagia 04/20/2012  . Hx of ovarian cyst 04/20/2012  . Epilepsy ([place]) 04/20/2012  . Hx  of seizure disorder 04/20/2012  . 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 Hx of dysmenorrhea 04/20/2012  . 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 Hx of hypercholesterolemia 04/20/2012  . Hx of PMS (premenstrual syndrome) 04/20/2012  . Encounter for therapeutic drug monitoring 05/01/2011    Past Surgical History:  Procedure Laterality Date  . ABDOMINAL HYSTERECTOMY  2013  . COLPOSCOPY    . CYSTOSCOPY Bilateral 05/30/2013   Procedure: CYSTOSCOPY;  Surgeon: Betsy Coder, MD;  Location: [facility];  Service: Gynecology;  Laterality: Bilateral;  . DILATION AND CURETTAGE OF UTERUS    . ENDOMETRIAL BIOPSY    . HYSTEROSCOPY W/ ENDOMETRIAL ABLATION    . LAPAROSCOPIC HYSTERECTOMY Right 05/30/2013   Procedure: HYSTERECTOMY TOTAL LAPAROSCOPIC;  Surgeon: Betsy Coder, MD;  Location: [facility];  Service: Gynecology;  Laterality: Right;  Right Salpingectomy  . POLYPECTOMY    . WISDOM TOOTH EXTRACTION      OB History   No obstetric history on file.      Home Medications    Prior to Admission medications   Medication Sig Start Date End Date Taking? Authorizing Provider  acyclovir (ZOVIRAX) 200 MG capsule Take 200 mg by mouth 3 (three) times daily as needed (fever blisters).     [provider]  atorvastatin (LIPITOR)  20 MG tablet Take 1 tablet (20 mg total) by mouth daily. 08/04/17   [name], MD  Boric Acid POWD Place 1 capsule vaginally See admin instructions. As directed for yeast infections    [provider]  Cariprazine HCl (VRAYLAR PO) Take by mouth.    [provider]  cephALEXin (KEFLEX) 500 MG capsule Take 1 capsule (500 mg total) by mouth 2 (two) times daily for 3 days. 01/26/20 01/29/20  [name], [name], PA-C  clonazePAM (KLONOPIN) 2 MG tablet TAKE 1 TABLET BY MOUTH IN THE MORNING AND 1 IN THE AFTERNOON AND 2 AT BEDTIME 01/02/20   [name], NP  diphenhydrAMINE (BENADRYL) 25 MG tablet Take 25 mg by mouth every 6 (six) hours as needed for allergies.     [provider]  furosemide (LASIX) 20 MG tablet Take 1 tablet (20 mg total) by mouth daily for 5 days. 12/05/18 12/10/18  Wieters, Hallie C, PA-C  gabapentin (NEURONTIN) 100 MG capsule TAKE 1 CAPSULE BY MOUTH TWO TIMES DAILY 11/23/19   [name], NP  hydrocortisone cream 1 % Apply 1 application topically as needed (itching).    [provider]  ibuprofen (ADVIL,MOTRIN) 600 MG tablet Take 1 tablet (600 mg total) by mouth every 6 (six) hours as needed (mild pain). 05/31/13   Earnstine Regal, PA-C  loperamide (IMODIUM) 2 MG capsule Take 2 mg by mouth 4 (four) times daily as needed for diarrhea or loose stools.    [provider]  magic mouthwash SOLN 15 mLs by Mouth Rinse route 2 (two) times daily as needed for mouth pain.  11/08/17   [provider]  meloxicam (MOBIC) 15 MG tablet Take 1 tablet (15 mg total) by mouth daily. 11/29/19   [name], MD  meloxicam (MOBIC) 7.5 MG tablet Take 1 tablet (7.5 mg total) by mouth daily. 04/25/19   [name], [name], PA-C  pantoprazole (PROTONIX) 40 MG tablet Take 40 mg by mouth 2 (two) times daily as needed (for reflux symptoms).     [provider]  PHENobarbital (LUMINAL) 64.8 MG tablet Take 1 tablet by mouth twice daily 10/26/19   [name], [name], MD  polyethylene glycol (MIRALAX) 17 g packet Take 17 g by mouth daily. 10/10/19   [name], [name], PA-C  pramipexole (MIRAPEX) 0.25 MG tablet TAKE 1 TABLET BY MOUTH TWICE DAILY AND 2 TABLETS AT BEDTIME 11/23/19   [name], NP  promethazine (PHENERGAN) 25 MG tablet Take 25 mg by mouth every 6 (six) hours as needed for nausea or vomiting.    [provider]  QUEtiapine (SEROQUEL) 100 MG tablet TAKE 1 & 1/2 (ONE & ONE-HALF) TABLETS BY MOUTH AT BEDTIME 11/23/19   [name], NP  Suvorexant (BELSOMRA) 20 MG TABS Take 1 tablet by mouth at bedtime as needed. 10/24/19   Dohmeier, Asencion Partridge, MD    Family History Family History  Problem Relation Age of Onset  . Heart  attack Father   . Breast cancer Sister     Social History Social History   Tobacco Use  . Smoking status: Former Research scientist (life sciences)  . Smokeless tobacco: Never Used  . Tobacco comment: Quit 10 years ago.  Substance Use Topics  . Alcohol use: No    Alcohol/week: 0.0 standard drinks  . Drug use: No     Allergies   Requip [ropinirole hcl], Topamax [topiramate], and Zonegran [zonisamide]   Review of Systems As per HPI   Physical Exam Triage Vital  Signs ED Triage Vitals [01/26/20 1015]  Enc Vitals Group     BP (!) 137/91     Pulse Rate 90     Resp 18     Temp 98.8 F (37.1 C)     Temp Source Oral     SpO2 93 %     Weight      Height      Head Circumference      Peak Flow      Pain Score      Pain Loc      Pain Edu?      Excl. in [place]?    No data found.  Updated Vital Signs BP (!) 137/91 (BP Location: Left Arm)   Pulse 90   Temp 98.8 F (37.1 C) (Oral)   Resp 18   LMP 05/11/2013   SpO2 93%   Visual Acuity Right Eye Distance:   Left Eye Distance:   Bilateral Distance:    Right Eye Near:   Left Eye Near:    Bilateral Near:     Physical Exam Constitutional:      General: She is not in acute distress. HENT:     Head: Normocephalic and atraumatic.     Mouth/Throat:     Mouth: Mucous membranes are moist.     Pharynx: Oropharynx is clear.  Eyes:     General: No scleral icterus.    Pupils: Pupils are equal, round, and reactive to light.  Cardiovascular:     Rate and Rhythm: Normal rate.  Pulmonary:     Effort: Pulmonary effort is normal.  Abdominal:     General: Bowel sounds are normal.     Palpations: Abdomen is soft.     Tenderness: There is no abdominal tenderness. There is no right CVA tenderness, left CVA tenderness or guarding.  Skin:    Coloration: Skin is not jaundiced or pale.  Neurological:     Mental Status: She is alert and oriented to person, place, and time.      UC Treatments / Results  Labs (all labs ordered are listed, but only  abnormal results are displayed) Labs Reviewed  POCT URINALYSIS DIP (MANUAL ENTRY) - Abnormal; Notable for the following components:      Result Value   Blood, UA moderate (*)    Protein Ur, POC =30 (*)    All other components within normal limits  POCT FASTING CBG [facility] MANUAL ENTRY - Normal    EKG   Radiology No results found.  Procedures Procedures (including critical care time)  Medications Ordered in UC Medications - No data to display  Initial Impression / Assessment and Plan / UC Course  I have reviewed the triage vital signs and the nursing notes.  Pertinent labs & imaging results that were available during my care of the patient were reviewed by me and considered in my medical decision making (see chart for details).     Patient afebrile, nontoxic in office today.  Patient fasting CBG 86.  Urine dipstick showing 30 protein, moderate blood.  Low concern for infectious process, though given symptoms we will treat for acute cystitis as outlined below.  Patient to follow-up closely with primary care for further evaluation/management for persistent or worsening symptoms.  Return precautions discussed, patient verbalized understanding and is agreeable to plan. Final Clinical Impressions(s) / UC Diagnoses   Final diagnoses:  Cystitis     Discharge Instructions     Take antibiotic as directed with food. Follow-up with  primary care for further evaluation if your symptoms are persisting. Return to urgent care for worsening pain, blood in urine, fever, back or belly pain.    ED Prescriptions    Medication Sig Dispense Auth. Provider   cephALEXin (KEFLEX) 500 MG capsule Take 1 capsule (500 mg total) by mouth 2 (two) times daily for 3 days. 6 capsule [name], [name], PA-C     PDMP not reviewed this encounter.   [name], [name], [place] 01/26/20 1153

## 2020-01-26 NOTE — ED Triage Notes (Signed)
Pt c/o urinary burning, frequency, and urgency x1wk

## 2020-02-02 ENCOUNTER — Other Ambulatory Visit: Payer: Self-pay

## 2020-02-02 ENCOUNTER — Ambulatory Visit: Payer: Medicare Other | Admitting: Physical Therapy

## 2020-02-02 DIAGNOSIS — M25611 Stiffness of right shoulder, not elsewhere classified: Secondary | ICD-10-CM

## 2020-02-02 DIAGNOSIS — M6281 Muscle weakness (generalized): Secondary | ICD-10-CM

## 2020-02-02 DIAGNOSIS — G8929 Other chronic pain: Secondary | ICD-10-CM

## 2020-02-02 DIAGNOSIS — M25511 Pain in right shoulder: Secondary | ICD-10-CM

## 2020-02-02 DIAGNOSIS — M25612 Stiffness of left shoulder, not elsewhere classified: Secondary | ICD-10-CM | POA: Diagnosis not present

## 2020-02-02 NOTE — Patient Instructions (Signed)
External Rotation (Passive)    With elbow bent and forearm on table, palm down, bend forward at waist until a stretch is felt. Hold ____30 seconds. Repeat __5__ times. Do __2__ sessions per day.  Copyright  VHI. All rights reserved.

## 2020-02-02 NOTE — Therapy (Signed)
[place], [place], [address] Phone: [phone]   Fax:  [phone]  Physical Therapy Treatment  Patient Details  Name: Teresa Lawrence MRN: [id number] Date of Birth: Jan 27, 1966 Referring Provider (PT): Dr. Marlou Sa    Encounter Date: 02/02/2020  PT End of Session - 02/02/20 1134    Visit Number  16    Number of Visits  16    Date for PT Re-Evaluation  02/16/20    Authorization Type  MCR/MCD    PT Start Time  1004    PT Stop Time  1047    PT Time Calculation (min)  43 min    Activity Tolerance  Patient tolerated treatment well    Behavior During Therapy  [facility] for tasks assessed/performed       Past Medical History:  Diagnosis Date  . Abnormal Pap smear   . Anxiety   . Bipolar disorder ([place])   . Dysmenorrhea   . Dysplasia   . Dysrhythmia    episodes of palpitations  . Dysuria   . H/O head injury   . Headache(784.0)   . High blood cholesterol   . HSV-2 infection   . Hx of hypercholesterolemia   . Hx of ovarian cyst   . Hx of seizure disorder   . Mass of ovary    left side  . Menorrhagia   . Mental retardation    mild per Dr[facility] in [facility]  . Mild mental retardation   . Perimenopausal vasomotor symptoms   . PMS (premenstrual syndrome)   . PMS (premenstrual syndrome)   . PMS (premenstrual syndrome)   . RLS (restless legs syndrome)   . Seizures ([place])   . Uterine mass   . Vaginal yeast infection 2007   recurrent   . Yeast vaginitis     Past Surgical History:  Procedure Laterality Date  . ABDOMINAL HYSTERECTOMY  2013  . COLPOSCOPY    . CYSTOSCOPY Bilateral 05/30/2013   Procedure: CYSTOSCOPY;  Surgeon: Betsy Coder, MD;  Location: [facility];  Service: Gynecology;  Laterality: Bilateral;  . DILATION AND CURETTAGE OF UTERUS    . ENDOMETRIAL BIOPSY    . HYSTEROSCOPY W/ ENDOMETRIAL ABLATION    . LAPAROSCOPIC HYSTERECTOMY Right 05/30/2013   Procedure: HYSTERECTOMY TOTAL LAPAROSCOPIC;   Surgeon: Betsy Coder, MD;  Location: [facility];  Service: Gynecology;  Laterality: Right;  Right Salpingectomy  . POLYPECTOMY    . WISDOM TOOTH EXTRACTION      There were no vitals filed for this visit.      [facility] PT Assessment - 02/02/20 0001      AROM   Right Shoulder Internal Rotation  75 Degrees    Right Shoulder External Rotation  22 Degrees   PROM to 35   Left Shoulder Internal Rotation  70 Degrees    Left Shoulder External Rotation  15 Degrees   PROM to 20      Strength   Right Shoulder Internal Rotation  5/5    Right Shoulder External Rotation  3+/5   in avail. ROM    Left Shoulder Internal Rotation  5/5    Left Shoulder External Rotation  3+/5   in avail. ROM                   [facility] Adult PT Treatment/Exercise - 02/02/20 0001      Shoulder Exercises: Supine   Horizontal ABduction  Both;15 reps    Horizontal ABduction Weight (  lbs)  3    External Rotation  Both;15 reps    Theraband Level (Shoulder External Rotation)  Level 2 (Red)    Other Supine Exercises  extension red band x 15 with PT above     Other Supine Exercises  I with bed mobility today as well with wedge       Shoulder Exercises: Seated   External Rotation  AAROM;Both;10 reps    External Rotation Weight (lbs)  also in seated arm in abduction    x 5, 30 sec    Flexion Weight (lbs)  chest press wand, 3 lbs       Shoulder Exercises: Standing   External Rotation Weight (lbs)  ROM on doorway    Other Standing Exercises  bicep curl 8 lbs x 20       Shoulder Exercises: Stretch   Table Stretch - External Rotation  5 reps;20 seconds      Manual Therapy   Manual Therapy  Joint mobilization    Joint Mobilization  inf glides to increase flexion, abduction GrI -II     Passive ROM  ER bilateral focus supine and seated             PT Education - 02/02/20 1131    Education Details  limited external rotation, how to stretch at home (reminder)    Person(s) Educated  Patient    Methods   Explanation;Demonstration;Verbal cues;Handout    Comprehension  Verbalized understanding;Need further instruction          PT Long Term Goals - 02/02/20 1135      PT LONG TERM GOAL #1   Title  Pt will be I with HEP for bilateral shoulder ROM and strength    Baseline  needed cues today    Status  On-going      PT LONG TERM GOAL #2   Title  Pt will be able to reach to her low back with min discomfort in Rt UE    Status  Achieved      PT LONG TERM GOAL #3   Title  Pt will be able to increase shoulder strength to 4/5 bilateral in flexion, abduction    Baseline  4-/5    Status  On-going      PT LONG TERM GOAL #4   Title  Pt will be able to lift 1-2 lbs above shoulder height with no difficulty    Status  On-going      PT LONG TERM GOAL #5   Title  FOTO score will increase to 43% or less disability to demo improvement in functional mobility.    Baseline  45% status    Status  On-going      PT LONG TERM GOAL #6   Title  Pt will be able to put her bra on (reaching back) without assist from her mother.    Baseline  done today    Status  Achieved            Plan - 02/02/20 1137    Clinical Impression Statement  Pt wiht increased pain today R>L. UE. She is very limited in external rotation strength and ROM bilaterally.  No improvement since eval in that , howevere her flexion and abduction cont to improve.  Asked that she refocus on ER ROM in order to effectively complete ADLs.    PT Treatment/Interventions  ADLs/Self Care Home Management;Iontophoresis 4mg /ml Dexamethasone;Cryotherapy;Electrical Stimulation;Moist Heat;Ultrasound;Functional mobility training;Therapeutic activities;Therapeutic exercise;Manual techniques;Passive range of motion;Neuromuscular re-education;Taping;Patient/family education  PT Next Visit Plan  Strength, dumbells, seated better than supine (use wedge. Check ER    PT Home Exercise Plan  seated ER stretch , wall wash, scapular retraction, ER, horizontal  abd yellow band, towel squeeze,Add row, extension has red band    Consulted and Agree with Plan of Care  Patient       Patient will benefit from skilled therapeutic intervention in order to improve the following deficits and impairments:  Decreased activity tolerance, Decreased range of motion, Decreased strength, Increased fascial restricitons, Impaired UE functional use, Pain, Obesity, Postural dysfunction, Impaired flexibility, Decreased mobility  Visit Diagnosis: Stiffness of left shoulder, not elsewhere classified  Stiffness of right shoulder, not elsewhere classified  Chronic left shoulder pain  Muscle weakness (generalized)  Chronic right shoulder pain     Problem List Patient Active Problem List   Diagnosis Date Noted  . Nondisplaced fracture of lateral malleolus of left fibula, initial encounter for closed fracture 10/25/2017  . Insomnia 03/17/2013  . Headache 02/23/2013  . Hx of yeast infection 04/20/2012  . Hx of menorrhagia 04/20/2012  . 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 Bipolar disorder ([place]) 04/20/2012  . Hx of menorrhagia 04/20/2012  . Hx of ovarian cyst 04/20/2012  . Epilepsy ([place]) 04/20/2012  . Hx of seizure disorder 04/20/2012  . 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 Hx of dysmenorrhea 04/20/2012  . 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 Hx of hypercholesterolemia 04/20/2012  . Hx of PMS (premenstrual syndrome) 04/20/2012  . Encounter for therapeutic drug monitoring 05/01/2011    Ilene Witcher 02/02/2020, 11:40 AM  [place], [place], [address] Phone: [phone]   Fax:  [phone]  Name: MALORIE DOTY MRN: [id number] Date of Birth: 1966-10-05  Raeford Razor, PT 02/02/20 11:40 AM Phone: [phone] Fax: [phone]

## 2020-02-06 ENCOUNTER — Other Ambulatory Visit: Payer: Self-pay

## 2020-02-06 ENCOUNTER — Ambulatory Visit: Payer: Medicare Other | Admitting: Physical Therapy

## 2020-02-06 ENCOUNTER — Encounter: Payer: Self-pay | Admitting: Physical Therapy

## 2020-02-06 DIAGNOSIS — G8929 Other chronic pain: Secondary | ICD-10-CM

## 2020-02-06 DIAGNOSIS — M6281 Muscle weakness (generalized): Secondary | ICD-10-CM

## 2020-02-06 DIAGNOSIS — M25612 Stiffness of left shoulder, not elsewhere classified: Secondary | ICD-10-CM | POA: Diagnosis not present

## 2020-02-06 DIAGNOSIS — M25511 Pain in right shoulder: Secondary | ICD-10-CM

## 2020-02-06 DIAGNOSIS — M25611 Stiffness of right shoulder, not elsewhere classified: Secondary | ICD-10-CM

## 2020-02-06 NOTE — Therapy (Signed)
[place], [place], [address] Phone: [phone]   Fax:  [phone]  Physical Therapy Treatment  Patient Details  Name: Teresa Lawrence MRN: [id number] Date of Birth: January 27, 1966 Referring Provider (PT): Dr. Marlou Sa    Encounter Date: 02/06/2020  PT End of Session - 02/06/20 1058    Visit Number  17    Date for PT Re-Evaluation  02/16/20    Authorization Type  MCR/MCD    PT Start Time  1052    PT Stop Time  1131    PT Time Calculation (min)  39 min    Activity Tolerance  Patient tolerated treatment well    Behavior During Therapy  [facility] for tasks assessed/performed       Past Medical History:  Diagnosis Date  . Abnormal Pap smear   . Anxiety   . Bipolar disorder ([place])   . Dysmenorrhea   . Dysplasia   . Dysrhythmia    episodes of palpitations  . Dysuria   . H/O head injury   . Headache(784.0)   . High blood cholesterol   . HSV-2 infection   . Hx of hypercholesterolemia   . Hx of ovarian cyst   . Hx of seizure disorder   . Mass of ovary    left side  . Menorrhagia   . Mental retardation    mild per Dr. notes in [facility]  . Mild mental retardation   . Perimenopausal vasomotor symptoms   . PMS (premenstrual syndrome)   . PMS (premenstrual syndrome)   . PMS (premenstrual syndrome)   . RLS (restless legs syndrome)   . Seizures ([place])   . Uterine mass   . Vaginal yeast infection 2007   recurrent   . Yeast vaginitis     Past Surgical History:  Procedure Laterality Date  . ABDOMINAL HYSTERECTOMY  2013  . COLPOSCOPY    . CYSTOSCOPY Bilateral 05/30/2013   Procedure: CYSTOSCOPY;  Surgeon: Betsy Coder, MD;  Location: [facility];  Service: Gynecology;  Laterality: Bilateral;  . DILATION AND CURETTAGE OF UTERUS    . ENDOMETRIAL BIOPSY    . HYSTEROSCOPY W/ ENDOMETRIAL ABLATION    . LAPAROSCOPIC HYSTERECTOMY Right 05/30/2013   Procedure: HYSTERECTOMY TOTAL LAPAROSCOPIC;  Surgeon: Betsy Coder, MD;   Location: [facility];  Service: Gynecology;  Laterality: Right;  Right Salpingectomy  . POLYPECTOMY    . WISDOM TOOTH EXTRACTION      There were no vitals filed for this visit.  Subjective Assessment - 02/06/20 1055    Subjective  My shoulders are killing me.  Both.  I did my stretches.  Not sure what I did.    Currently in Pain?  Yes    Pain Location  Shoulder    Pain Orientation  Right;Left    Pain Descriptors / Indicators  Aching;Sore    Pain Type  Chronic pain    Pain Onset  More than a month ago    Pain Frequency  Intermittent    Aggravating Factors   end range stretch, reaching back    Pain Relieving Factors  rest, Ibuprofen, does not use ice or heat         [facility] Adult PT Treatment/Exercise - 02/06/20 0001      Shoulder Exercises: Supine   External Rotation Weight (lbs)  AAROM x 15 cane     Shoulder Flexion Weight (lbs)  cane x 15 cues       Shoulder Exercises: Seated  Retraction Weight (lbs)  x 20     Other Seated Exercises  overhead lift 1# and 2# x 10 each arm , min incr pain       Shoulder Exercises: Standing   Shoulder Flexion Weight (lbs)  wand x 10 on wall     Shoulder ABduction Weight (lbs)  wand x 10 each side     Extension Weight (lbs)  wand x 10, added IR slide up hips/back       Manual Therapy   Joint Mobilization  inferior glide GH jt , gnentle distraction , oscillation     Passive ROM  supine ER and IR bilaterally         PT Long Term Goals - 02/06/20 1102      PT LONG TERM GOAL #1   Title  Pt will be I with HEP for bilateral shoulder ROM and strength    Baseline  needed cues today    Status  On-going      PT LONG TERM GOAL #2   Title  Pt will be able to reach to her low back with min discomfort in Rt UE    Baseline  varies , today painful and limited    Status  Partially Met      PT LONG TERM GOAL #3   Title  Pt will be able to increase shoulder strength to 4/5 bilateral in flexion, abduction    Baseline  3+/5 pain today    Status  On-going       PT LONG TERM GOAL #4   Title  Pt will be able to lift 1-2 lbs above shoulder height with no difficulty    Baseline  min pain increase, difficulty    Status  On-going            Plan - 02/06/20 1114    Clinical Impression Statement  Patient had increased pain today in both shoulders.  She is diligent with daily HEP.  She cannot wash her own hair and is getting better with dressing (bra) independently.  She is limited in AROM (ER mainly) as well as strength thorughout.  Progress is plateaued.  I would like her to cont PT until she sees her MD in 1 month.  Focus on manual PT in hopes of avoiding a manipulation.    Personal Factors and Comorbidities  Behavior Pattern;Comorbidity 1;Social Background;Past/Current Experience    Comorbidities  obesity,cognition, bipolar, RA    Examination-Activity Limitations  Bathing;Bed Mobility;Dressing;Self Feeding;Carry;Hygiene/Grooming    Examination-Participation Restrictions  Interpersonal Relationship;Cleaning;Community Activity    Stability/Clinical Decision Making  Evolving/Moderate complexity    Clinical Decision Making  Moderate    Rehab Potential  Good    PT Frequency  1x / week   start 1 x 02/16/20   PT Duration  6 weeks    PT Treatment/Interventions  ADLs/Self Care Home Management;Iontophoresis 50m/ml Dexamethasone;Cryotherapy;Electrical Stimulation;Moist Heat;Ultrasound;Functional mobility training;Therapeutic activities;Therapeutic exercise;Manual techniques;Passive range of motion;Neuromuscular re-education;Taping;Patient/family education    PT Next Visit Plan  Manual, ROM and strength    PT Home Exercise Plan  seated ER stretch , wall wash, scapular retraction, ER, horizontal abd yellow band, towel squeeze,Add row, extension has red band    Consulted and Agree with Plan of Care  Patient       Patient will benefit from skilled therapeutic intervention in order to improve the following deficits and impairments:  Decreased activity  tolerance, Decreased range of motion, Decreased strength, Increased fascial restricitons, Impaired UE functional use, Pain, Obesity,  Postural dysfunction, Impaired flexibility, Decreased mobility  Visit Diagnosis: Stiffness of left shoulder, not elsewhere classified  Stiffness of right shoulder, not elsewhere classified  Chronic left shoulder pain  Muscle weakness (generalized)  Chronic right shoulder pain     Problem List Patient Active Problem List   Diagnosis Date Noted  . Nondisplaced fracture of lateral malleolus of left fibula, initial encounter for closed fracture 10/25/2017  . Insomnia 03/17/2013  . Headache 02/23/2013  . Hx of yeast infection 04/20/2012  . Hx of menorrhagia 04/20/2012  . 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 Bipolar disorder ([place]) 04/20/2012  . Hx of menorrhagia 04/20/2012  . Hx of ovarian cyst 04/20/2012  . Epilepsy ([place]) 04/20/2012  . Hx of seizure disorder 04/20/2012  . 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 Hx of dysmenorrhea 04/20/2012  . 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 Hx of hypercholesterolemia 04/20/2012  . Hx of PMS (premenstrual syndrome) 04/20/2012  . Encounter for therapeutic drug monitoring 05/01/2011    Alexavier Tsutsui 02/06/2020, 1:41 PM  [facility] [address] [place], [place], [address] Phone: [phone]   Fax:  [phone]  Name: Teresa Lawrence MRN: [id number] Date of Birth: 1966/01/20  Raeford Razor, PT 02/06/20 1:41 PM Phone: [phone] Fax: [phone]

## 2020-02-09 ENCOUNTER — Ambulatory Visit: Payer: Medicare Other | Admitting: Physical Therapy

## 2020-02-09 ENCOUNTER — Other Ambulatory Visit: Payer: Self-pay

## 2020-02-09 DIAGNOSIS — M25612 Stiffness of left shoulder, not elsewhere classified: Secondary | ICD-10-CM

## 2020-02-09 DIAGNOSIS — G8929 Other chronic pain: Secondary | ICD-10-CM

## 2020-02-09 DIAGNOSIS — M25511 Pain in right shoulder: Secondary | ICD-10-CM

## 2020-02-09 DIAGNOSIS — M25611 Stiffness of right shoulder, not elsewhere classified: Secondary | ICD-10-CM

## 2020-02-09 DIAGNOSIS — M25512 Pain in left shoulder: Secondary | ICD-10-CM

## 2020-02-09 DIAGNOSIS — M6281 Muscle weakness (generalized): Secondary | ICD-10-CM

## 2020-02-09 NOTE — Therapy (Signed)
[place], [place], [address] Phone: [phone]   Fax:  [phone]  Physical Therapy Treatment  Patient Details  Name: Teresa Lawrence MRN: [id number] Date of Birth: [birth year] Referring Provider (PT): Dr. Marlou Sa    Encounter Date: 02/09/2020  PT End of Session - 02/09/20 1145    Visit Number  18    Date for PT Re-Evaluation  02/16/20    Authorization Type  MCR/MCD    Authorization Time Period  10th visit PN    PT Start Time  1130    PT Stop Time  1215    PT Time Calculation (min)  45 min    Activity Tolerance  Patient tolerated treatment well    Behavior During Therapy  [facility] for tasks assessed/performed       Past Medical History:  Diagnosis Date  . Abnormal Pap smear   . Anxiety   . Bipolar disorder ([place])   . Dysmenorrhea   . Dysplasia   . Dysrhythmia    episodes of palpitations  . Dysuria   . H/O head injury   . Headache(784.0)   . High blood cholesterol   . HSV-2 infection   . Hx of hypercholesterolemia   . Hx of ovarian cyst   . Hx of seizure disorder   . Mass of ovary    left side  . Menorrhagia   . Mental retardation    mild per Dr. notes in [facility]  . Mild mental retardation   . Perimenopausal vasomotor symptoms   . PMS (premenstrual syndrome)   . PMS (premenstrual syndrome)   . PMS (premenstrual syndrome)   . RLS (restless legs syndrome)   . Seizures ([place])   . Uterine mass   . Vaginal yeast infection 2007   recurrent   . Yeast vaginitis     Past Surgical History:  Procedure Laterality Date  . ABDOMINAL HYSTERECTOMY  2013  . COLPOSCOPY    . CYSTOSCOPY Bilateral 05/30/2013   Procedure: CYSTOSCOPY;  Surgeon: Betsy Coder, MD;  Location: [facility];  Service: Gynecology;  Laterality: Bilateral;  . DILATION AND CURETTAGE OF UTERUS    . ENDOMETRIAL BIOPSY    . HYSTEROSCOPY W/ ENDOMETRIAL ABLATION    . LAPAROSCOPIC HYSTERECTOMY Right 05/30/2013   Procedure: HYSTERECTOMY TOTAL  LAPAROSCOPIC;  Surgeon: Betsy Coder, MD;  Location: [facility];  Service: Gynecology;  Laterality: Right;  Right Salpingectomy  . POLYPECTOMY    . WISDOM TOOTH EXTRACTION      There were no vitals filed for this visit.  Subjective Assessment - 02/09/20 1141    Subjective  Did not do my exercises.  Shoulders about 3/10.    Currently in Pain?  Yes    Pain Location  Shoulder    Pain Orientation  Right;Left    Pain Descriptors / Indicators  Aching;Sore    Pain Type  Chronic pain    Pain Onset  More than a month ago        [facility] Adult PT Treatment/Exercise - 02/09/20 0001      Shoulder Exercises: Seated   Elevation Weight (lbs)  2 lbs seated arm on table x 10 , 3 lbs     External Rotation  Strengthening;Both;15 reps    External Rotation Weight (lbs)  2     ABduction Weight (lbs)  2 lbs arm on table x 10, scpaular focus       Shoulder Exercises: Standing   External Rotation  Strengthening;Right;20 reps;Theraband  Theraband Level (Shoulder External Rotation)  Level 2 (Red)    Row  Both;20 reps;Theraband    Theraband Level (Shoulder Row)  Level 3 (Green)      Shoulder Exercises: ROM/Strengthening   [place]  seated and standing flexion, scaption and horizontal abd/adduction, about 1 min each     Wall Wash  ball on the wall x 20 each direction (small red)       Shoulder Exercises: IT sales professional Limitations  doorway 1 arm at a time x 5 , 20 sec each     Table Stretch - Flexion  3 reps;30 seconds    Table Stretch -Flexion Limitations  used ball                   PT Long Term Goals - 02/06/20 1102      PT LONG TERM GOAL #1   Title  Pt will be I with HEP for bilateral shoulder ROM and strength    Baseline  needed cues today    Status  On-going      PT LONG TERM GOAL #2   Title  Pt will be able to reach to her low back with min discomfort in Rt UE    Baseline  varies , today painful and limited    Status  Partially Met      PT LONG TERM GOAL #3   Title   Pt will be able to increase shoulder strength to 4/5 bilateral in flexion, abduction    Baseline  3+/5 pain today    Status  On-going      PT LONG TERM GOAL #4   Title  Pt will be able to lift 1-2 lbs above shoulder height with no difficulty    Baseline  min pain increase, difficulty    Status  On-going            Plan - 02/09/20 1147    Clinical Impression Statement  Focused on UE strength bilaterally.  Needs cues for proper technique, compensates.  Felt loosened up as she left session. Declines heat or ice.    PT Treatment/Interventions  ADLs/Self Care Home Management;Iontophoresis 67m/ml Dexamethasone;Cryotherapy;Electrical Stimulation;Moist Heat;Ultrasound;Functional mobility training;Therapeutic activities;Therapeutic exercise;Manual techniques;Passive range of motion;Neuromuscular re-education;Taping;Patient/family education    PT Next Visit Plan  Manual, ROM and strength    PT Home Exercise Plan  seated ER stretch , wall wash, scapular retraction, ER, horizontal abd yellow band, towel squeeze,Add row, extension has red band    Consulted and Agree with Plan of Care  Patient       Patient will benefit from skilled therapeutic intervention in order to improve the following deficits and impairments:  Decreased activity tolerance, Decreased range of motion, Decreased strength, Increased fascial restricitons, Impaired UE functional use, Pain, Obesity, Postural dysfunction, Impaired flexibility, Decreased mobility  Visit Diagnosis: Stiffness of left shoulder, not elsewhere classified  Stiffness of right shoulder, not elsewhere classified  Chronic left shoulder pain  Muscle weakness (generalized)  Chronic right shoulder pain     Problem List Patient Active Problem List   Diagnosis Date Noted  . Nondisplaced fracture of lateral malleolus of left fibula, initial encounter for closed fracture 10/25/2017  . Insomnia 03/17/2013  . Headache 02/23/2013  . Hx of yeast infection  04/20/2012  . Hx of menorrhagia 04/20/2012  . 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 Bipolar disorder ([place] 04/20/2012  . Hx of menorrhagia 04/20/2012  . Hx of ovarian cyst 04/20/2012  .  Epilepsy ([place]) 04/20/2012  . Hx of seizure disorder 04/20/2012  . 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 Hx of dysmenorrhea 04/20/2012  . 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 Hx of hypercholesterolemia 04/20/2012  . Hx of PMS (premenstrual syndrome) 04/20/2012  . Encounter for therapeutic drug monitoring 05/01/2011    Anahid Eskelson 02/09/2020, 12:15 PM  [place], [place], [address] Phone: [phone]   Fax:  [phone]  Name: Teresa Lawrence MRN: [id number] Date of Birth: February 09, 1966  Raeford Razor, PT 02/09/20 12:15 PM Phone: [phone] Fax: [phone]

## 2020-02-13 ENCOUNTER — Ambulatory Visit: Payer: Medicare Other | Admitting: Physical Therapy

## 2020-02-16 ENCOUNTER — Ambulatory Visit: Payer: Medicare Other | Admitting: Physical Therapy

## 2020-02-20 ENCOUNTER — Other Ambulatory Visit: Payer: Self-pay

## 2020-02-20 ENCOUNTER — Encounter: Payer: Self-pay | Admitting: Emergency Medicine

## 2020-02-20 ENCOUNTER — Ambulatory Visit
Admission: EM | Admit: 2020-02-20 | Discharge: 2020-02-20 | Disposition: A | Discharge status: Home / Self Care | Payer: Medicare Other | Attending: Emergency Medicine | Admitting: Emergency Medicine

## 2020-02-20 DIAGNOSIS — J069 Acute upper respiratory infection, unspecified: Secondary | ICD-10-CM | POA: Diagnosis present

## 2020-02-20 DIAGNOSIS — J029 Acute pharyngitis, unspecified: Secondary | ICD-10-CM | POA: Diagnosis not present

## 2020-02-20 LAB — POCT RAPID STREP A (OFFICE): Rapid Strep A Screen: NEGATIVE

## 2020-02-20 MED ORDER — FLUTICASONE PROPIONATE 50 MCG/ACT NA SUSP: 1.0000 | Freq: Every day | NASAL | 0 refills | Status: DC

## 2020-02-20 MED ORDER — CETIRIZINE HCL 10 MG PO TABS: 10.0000 mg | ORAL_TABLET | Freq: Every day | ORAL | 0 refills | Status: DC

## 2020-02-20 MED ORDER — PREDNISONE 20 MG PO TABS: 20.0000 mg | ORAL_TABLET | Freq: Every day | ORAL | 0 refills | Status: DC

## 2020-02-20 MED ORDER — BENZONATATE 100 MG PO CAPS: 100.0000 mg | ORAL_CAPSULE | Freq: Three times a day (TID) | ORAL | 0 refills | Status: DC

## 2020-02-20 NOTE — ED Triage Notes (Addendum)
Sore throat started last night, patient also has a bad cough

## 2020-02-20 NOTE — ED Provider Notes (Signed)
[name]    CSN: [id number] Arrival date & time: 02/20/20  0931      History   Chief Complaint Chief Complaint  Patient presents with  . Sore Throat    HPI Teresa Lawrence is a 54 y.o. female with history of seizures, headache, BPD type I presenting for sore throat since last night.  Patient also endorsing dry cough without difficulty breathing, chest pain, hemoptysis.  No fever, known sick contacts.  Does endorse some fatigue.  No chest pain, palpitations, lower extreme edema.  Patient did receive her [place] Covid vaccine in March.  Past Medical History:  Diagnosis Date  . Abnormal Pap smear   . Anxiety   . Bipolar disorder ([place])   . Dysmenorrhea   . Dysplasia   . Dysrhythmia    episodes of palpitations  . Dysuria   . H/O head injury   . Headache(784.0)   . High blood cholesterol   . HSV-2 infection   . Hx of hypercholesterolemia   . Hx of ovarian cyst   . Hx of seizure disorder   . Mass of ovary    left side  . Menorrhagia   . Mental retardation    mild per Dr[facility] in [facility]  . Mild mental retardation   . Perimenopausal vasomotor symptoms   . PMS (premenstrual syndrome)   . PMS (premenstrual syndrome)   . PMS (premenstrual syndrome)   . RLS (restless legs syndrome)   . Seizures ([place])   . Uterine mass   . Vaginal yeast infection 2007   recurrent   . Yeast vaginitis     Patient Active Problem List   Diagnosis Date Noted  . Nondisplaced fracture of lateral malleolus of left fibula, initial encounter for closed fracture 10/25/2017  . Insomnia 03/17/2013  . Headache 02/23/2013  . Hx of yeast infection 04/20/2012  . Hx of menorrhagia 04/20/2012  . 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 Bipolar disorder ([place]) 04/20/2012  . Hx of menorrhagia 04/20/2012  . Hx of ovarian cyst 04/20/2012  . Epilepsy ([place]) 04/20/2012  . Hx of seizure disorder 04/20/2012  . 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 Hx of dysmenorrhea 04/20/2012  .  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 Hx of hypercholesterolemia 04/20/2012  . Hx of PMS (premenstrual syndrome) 04/20/2012  . Encounter for therapeutic drug monitoring 05/01/2011    Past Surgical History:  Procedure Laterality Date  . ABDOMINAL HYSTERECTOMY  2013  . COLPOSCOPY    . CYSTOSCOPY Bilateral 05/30/2013   Procedure: CYSTOSCOPY;  Surgeon: Betsy Coder, MD;  Location: [facility];  Service: Gynecology;  Laterality: Bilateral;  . DILATION AND CURETTAGE OF UTERUS    . ENDOMETRIAL BIOPSY    . HYSTEROSCOPY W/ ENDOMETRIAL ABLATION    . LAPAROSCOPIC HYSTERECTOMY Right 05/30/2013   Procedure: HYSTERECTOMY TOTAL LAPAROSCOPIC;  Surgeon: Betsy Coder, MD;  Location: [facility];  Service: Gynecology;  Laterality: Right;  Right Salpingectomy  . POLYPECTOMY    . WISDOM TOOTH EXTRACTION      OB History   No obstetric history on file.      Home Medications    Prior to Admission medications   Medication Sig Start Date End Date Taking? Authorizing Provider  acyclovir (ZOVIRAX) 200 MG capsule Take 200 mg by mouth 3 (three) times daily as needed (fever blisters).     [provider]  atorvastatin (LIPITOR) 20 MG tablet Take 1 tablet (20 mg total) by mouth daily. 08/04/17  [name], MD  benzonatate (TESSALON) 100 MG capsule Take 1 capsule (100 mg total) by mouth every 8 (eight) hours. 02/20/20   [name], [name], PA-C  Boric Acid POWD Place 1 capsule vaginally See admin instructions. As directed for yeast infections    [provider]  Cariprazine HCl (VRAYLAR PO) Take by mouth.    [provider]  cetirizine (ZYRTEC ALLERGY) 10 MG tablet Take 1 tablet (10 mg total) by mouth daily. 02/20/20   [name], [name], PA-C  clonazePAM (KLONOPIN) 2 MG tablet TAKE 1 TABLET BY MOUTH IN THE MORNING AND 1 IN THE AFTERNOON AND 2 AT BEDTIME 01/02/20   [name], NP  diphenhydrAMINE (BENADRYL) 25 MG tablet Take 25 mg by mouth every 6 (six) hours as needed for  allergies.    [provider]  fluticasone (FLONASE) 50 MCG/ACT nasal spray Place 1 spray into both nostrils daily. 02/20/20   [name], [name], PA-C  furosemide (LASIX) 20 MG tablet Take 1 tablet (20 mg total) by mouth daily for 5 days. 12/05/18 12/10/18  Wieters, Hallie C, PA-C  gabapentin (NEURONTIN) 100 MG capsule TAKE 1 CAPSULE BY MOUTH TWO TIMES DAILY 11/23/19   [name], NP  hydrocortisone cream 1 % Apply 1 application topically as needed (itching).    [provider]  ibuprofen (ADVIL,MOTRIN) 600 MG tablet Take 1 tablet (600 mg total) by mouth every 6 (six) hours as needed (mild pain). 05/31/13   Earnstine Regal, PA-C  loperamide (IMODIUM) 2 MG capsule Take 2 mg by mouth 4 (four) times daily as needed for diarrhea or loose stools.    [provider]  magic mouthwash SOLN 15 mLs by Mouth Rinse route 2 (two) times daily as needed for mouth pain.  11/08/17   [provider]  meloxicam (MOBIC) 15 MG tablet Take 1 tablet (15 mg total) by mouth daily. 11/29/19   [name], MD  meloxicam (MOBIC) 7.5 MG tablet Take 1 tablet (7.5 mg total) by mouth daily. 04/25/19   [name], [name], PA-C  pantoprazole (PROTONIX) 40 MG tablet Take 40 mg by mouth 2 (two) times daily as needed (for reflux symptoms).     [provider]  PHENobarbital (LUMINAL) 64.8 MG tablet Take 1 tablet by mouth twice daily 10/26/19   [name], [name], MD  polyethylene glycol (MIRALAX) 17 g packet Take 17 g by mouth daily. 10/10/19   [name], [name], PA-C  pramipexole (MIRAPEX) 0.25 MG tablet TAKE 1 TABLET BY MOUTH TWICE DAILY AND 2 TABLETS AT BEDTIME 11/23/19   [name], NP  predniSONE (DELTASONE) 20 MG tablet Take 1 tablet (20 mg total) by mouth daily. 02/20/20   [name], [name], PA-C  promethazine (PHENERGAN) 25 MG tablet Take 25 mg by mouth every 6 (six) hours as needed for nausea or vomiting.    [provider]  QUEtiapine (SEROQUEL) 100 MG tablet TAKE 1 & 1/2 (ONE &  ONE-HALF) TABLETS BY MOUTH AT BEDTIME 11/23/19   [name], NP  Suvorexant (BELSOMRA) 20 MG TABS Take 1 tablet by mouth at bedtime as needed. 10/24/19   Dohmeier, Asencion Partridge, MD    Family History Family History  Problem Relation Age of Onset  . Heart attack Father   . Breast cancer Sister     Social History Social History   Tobacco Use  . Smoking status: Former Research scientist (life sciences)  . Smokeless tobacco: Never Used  . Tobacco comment: Quit 10 years ago.  Substance Use Topics  .  Alcohol use: No    Alcohol/week: 0.0 standard drinks  . Drug use: No     Allergies   Requip [ropinirole hcl], Topamax [topiramate], and Zonegran [zonisamide]   Review of Systems As per HPI   Physical Exam Triage Vital Signs ED Triage Vitals  Enc Vitals Group     BP 02/20/20 1009 127/85     Pulse Rate 02/20/20 1009 98     Resp 02/20/20 1009 18     Temp 02/20/20 1009 98.3 F (36.8 C)     Temp Source 02/20/20 1009 Oral     SpO2 02/20/20 1009 91 %     Weight --      Height --      Head Circumference --      Peak Flow --      Pain Score 02/20/20 1018 9     Pain Loc --      Pain Edu? --      Excl. in [place]? --    No data found.  Updated Vital Signs BP 127/85 (BP Location: Left Arm)   Pulse 98   Temp 98.3 F (36.8 C) (Oral)   Resp 18   LMP 05/11/2013   SpO2 91%   Visual Acuity Right Eye Distance:   Left Eye Distance:   Bilateral Distance:    Right Eye Near:   Left Eye Near:    Bilateral Near:     Physical Exam Constitutional:      General: She is not in acute distress.    Appearance: She is obese. She is not toxic-appearing.  HENT:     Head: Normocephalic and atraumatic.     Right Ear: Tympanic membrane, ear canal and external ear normal.     Left Ear: Tympanic membrane, ear canal and external ear normal.     Nose: No nasal deformity, congestion or rhinorrhea.     Comments: Turbinates nonedematous bilaterally with pink mucosa    Mouth/Throat:     Mouth: Mucous membranes are moist.      Tongue: Tongue does not deviate from midline.     Pharynx: Oropharynx is clear. Uvula midline.     Tonsils: No tonsillar exudate. 2+ on the right. 2+ on the left.  Eyes:     General: No scleral icterus.    Conjunctiva/sclera: Conjunctivae normal.     Pupils: Pupils are equal, round, and reactive to light.  Neck:     Thyroid: No thyromegaly.  Cardiovascular:     Rate and Rhythm: Normal rate and regular rhythm.  Pulmonary:     Effort: Pulmonary effort is normal. No respiratory distress.     Breath sounds: No wheezing.  Musculoskeletal:     Cervical back: Normal range of motion and neck supple. No muscular tenderness.  Lymphadenopathy:     Cervical: Cervical adenopathy present.  Neurological:     Mental Status: She is alert.      UC Treatments / Results  Labs (all labs ordered are listed, but only abnormal results are displayed) Labs Reviewed  CULTURE, GROUP A STREP ([place])  NOVEL CORONAVIRUS, NAA  POCT RAPID STREP A (OFFICE)    EKG   Radiology No results found.  Procedures Procedures (including critical care time)  Medications Ordered in UC Medications - No data to display  Initial Impression / Assessment and Plan / UC Course  I have reviewed the triage vital signs and the nursing notes.  Pertinent labs & imaging results that were available during my care of the patient  were reviewed by me and considered in my medical decision making (see chart for details).     Patient afebrile, nontoxic, with SpO2 91%.  Rapid strep negative, culture pending.  Covid PCR pending.  Patient to quarantine until results are back.  We will treat supportively as outlined below.  Return precautions discussed, patient verbalized understanding and is agreeable to plan. Final Clinical Impressions(s) / UC Diagnoses   Final diagnoses:  URI with cough and congestion  Sore throat     Discharge Instructions     Tessalon for cough. Start flonase, atrovent nasal spray for nasal  congestion/drainage. You can use over the counter nasal saline rinse such as neti pot for nasal congestion. Keep hydrated, your urine should be clear to pale yellow in color. Tylenol/motrin for fever and pain. Monitor for any worsening of symptoms, chest pain, shortness of breath, wheezing, swelling of the throat, go to the emergency department for further evaluation needed.     ED Prescriptions    Medication Sig Dispense Auth. Provider   benzonatate (TESSALON) 100 MG capsule Take 1 capsule (100 mg total) by mouth every 8 (eight) hours. 21 capsule [name], [name], PA-C   fluticasone (FLONASE) 50 MCG/ACT nasal spray Place 1 spray into both nostrils daily. 16 g [name], [name], PA-C   cetirizine (ZYRTEC ALLERGY) 10 MG tablet Take 1 tablet (10 mg total) by mouth daily. 30 tablet [name], [name], PA-C   predniSONE (DELTASONE) 20 MG tablet Take 1 tablet (20 mg total) by mouth daily. 5 tablet [name], [name], PA-C     PDMP not reviewed this encounter.   [name], [name], [place] 02/20/20 1554

## 2020-02-20 NOTE — Discharge Instructions (Signed)

## 2020-02-21 LAB — SARS-COV-2, NAA 2 DAY TAT

## 2020-02-21 LAB — NOVEL CORONAVIRUS, NAA: SARS-CoV-2, NAA: NOT DETECTED

## 2020-02-23 LAB — CULTURE, GROUP A STREP (THRC)

## 2020-02-26 ENCOUNTER — Ambulatory Visit: Payer: Medicare Other | Attending: Orthopedic Surgery | Admitting: Physical Therapy

## 2020-02-26 ENCOUNTER — Other Ambulatory Visit: Payer: Self-pay

## 2020-02-26 DIAGNOSIS — M25611 Stiffness of right shoulder, not elsewhere classified: Secondary | ICD-10-CM | POA: Diagnosis present

## 2020-02-26 DIAGNOSIS — M6281 Muscle weakness (generalized): Secondary | ICD-10-CM | POA: Diagnosis present

## 2020-02-26 DIAGNOSIS — M25612 Stiffness of left shoulder, not elsewhere classified: Secondary | ICD-10-CM | POA: Diagnosis not present

## 2020-02-26 DIAGNOSIS — M25511 Pain in right shoulder: Secondary | ICD-10-CM

## 2020-02-26 DIAGNOSIS — G8929 Other chronic pain: Secondary | ICD-10-CM | POA: Insufficient documentation

## 2020-02-26 DIAGNOSIS — M25512 Pain in left shoulder: Secondary | ICD-10-CM | POA: Diagnosis present

## 2020-02-26 NOTE — Therapy (Signed)
[name], [place], [address] Phone: [phone]   Fax:  [phone]  Physical Therapy Treatment/DIscharge  Patient Details  Name: Teresa Lawrence MRN: [id number] Date of Birth: 1965/12/24 Referring Provider (PT): Dr. Marlou Sa    Encounter Date: 02/26/2020  PT End of Session - 02/26/20 1054    Visit Number  19    Authorization Type  MCR/MCD    Authorization Time Period  10th visit PN    PT Start Time  1046    PT Stop Time  1127    PT Time Calculation (min)  41 min    Activity Tolerance  Patient tolerated treatment well    Behavior During Therapy  [facility] for tasks assessed/performed       Past Medical History:  Diagnosis Date  . Abnormal Pap smear   . Anxiety   . Bipolar disorder ([place])   . Dysmenorrhea   . Dysplasia   . Dysrhythmia    episodes of palpitations  . Dysuria   . H/O head injury   . Headache(784.0)   . High blood cholesterol   . HSV-2 infection   . Hx of hypercholesterolemia   . Hx of ovarian cyst   . Hx of seizure disorder   . Mass of ovary    left side  . Menorrhagia   . Mental retardation    mild per Dr. notes in [facility]  . Mild mental retardation   . Perimenopausal vasomotor symptoms   . PMS (premenstrual syndrome)   . PMS (premenstrual syndrome)   . PMS (premenstrual syndrome)   . RLS (restless legs syndrome)   . Seizures ([place])   . Uterine mass   . Vaginal yeast infection 2007   recurrent   . Yeast vaginitis     Past Surgical History:  Procedure Laterality Date  . ABDOMINAL HYSTERECTOMY  2013  . COLPOSCOPY    . CYSTOSCOPY Bilateral 05/30/2013   Procedure: CYSTOSCOPY;  Surgeon: Betsy Coder, MD;  Location: [facility];  Service: Gynecology;  Laterality: Bilateral;  . DILATION AND CURETTAGE OF UTERUS    . ENDOMETRIAL BIOPSY    . HYSTEROSCOPY W/ ENDOMETRIAL ABLATION    . LAPAROSCOPIC HYSTERECTOMY Right 05/30/2013   Procedure: HYSTERECTOMY TOTAL LAPAROSCOPIC;  Surgeon: Betsy Coder, MD;  Location: [facility];  Service: Gynecology;  Laterality: Right;  Right Salpingectomy  . POLYPECTOMY    . WISDOM TOOTH EXTRACTION      There were no vitals filed for this visit.  Subjective Assessment - 02/26/20 1056    Subjective  Was sick last week.  4/10 pain L UE mostly.    Currently in Pain?  Yes    Pain Score  4     Pain Location  Shoulder    Pain Orientation  Right;Left    Pain Descriptors / Indicators  Aching    Pain Onset  More than a month ago    Aggravating Factors   reaching behind back    Pain Relieving Factors  rest, Ibuprofen, stretching         [facility] PT Assessment - 02/26/20 0001      Observation/Other Assessments   Focus on Therapeutic Outcomes (FOTO)   47%      AROM   Right Shoulder Flexion  133 Degrees    Right Shoulder ABduction  115 Degrees    Right Shoulder Internal Rotation  --   FR to lumbar    Right Shoulder External Rotation  --  FR to C7, lacks abd    Left Shoulder Flexion  135 Degrees    Left Shoulder ABduction  134 Degrees    Left Shoulder Internal Rotation  --   FR to lumbar    Left Shoulder External Rotation  --   FR to T1      Strength   Right Shoulder Flexion  4-/5    Right Shoulder ABduction  4-/5    Right Shoulder Internal Rotation  5/5    Right Shoulder External Rotation  3+/5   in avail. ROM    Left Shoulder Flexion  3/5    Left Shoulder ABduction  3/5    Left Shoulder Internal Rotation  5/5    Left Shoulder External Rotation  3+/5   in avail. ROM          [facility] Adult PT Treatment/Exercise - 02/26/20 0001      Shoulder Exercises: Standing   Horizontal ABduction  Strengthening;Both;20 reps    Theraband Level (Shoulder Horizontal ABduction)  Level 2 (Red)    External Rotation  Strengthening;Right;20 reps;Theraband    Theraband Level (Shoulder External Rotation)  Level 2 (Red)    Extension  Strengthening;Both;20 reps;Theraband    Theraband Level (Shoulder Extension)  Level 2 (Red)    Row   Strengthening;Both;15 reps;Theraband    Theraband Level (Shoulder Row)  Level 2 (Red);Level 3 (Green)      Shoulder Exercises: Pulleys   Flexion  3 minutes      Shoulder Exercises: Stretch   Corner Stretch  2 reps;30 seconds    Cross Chest Stretch  2 reps;30 seconds    Internal Rotation Stretch Limitations  post capsule x 2 and then functional reach IR bilateral              PT Education - 02/26/20 1314    Education Details  DC    Person(s) Educated  Patient    Methods  Explanation;Demonstration    Comprehension  Verbalized understanding;Returned demonstration          PT Long Term Goals - 02/26/20 1105      PT LONG TERM GOAL #1   Title  Pt will be I with HEP for bilateral shoulder ROM and strength    Status  Partially Met      PT LONG TERM GOAL #2   Title  Pt will be able to reach to her low back with min discomfort in Rt UE    Status  Partially Met      PT LONG TERM GOAL #3   Title  Pt will be able to increase shoulder strength to 4/5 bilateral in flexion, abduction    Status  Partially Met      PT LONG TERM GOAL #4   Title  Pt will be able to lift 1-2 lbs above shoulder height with no difficulty    Baseline  min pain increase, difficulty    Status  Not Met      PT LONG TERM GOAL #5   Title  FOTO score will increase to 43% or less disability to demo improvement in functional mobility.    Baseline  47%    Status  Not Met      PT LONG TERM GOAL #6   Title  Pt will be able to put her bra on (reaching back) without assist from her mother.    Baseline  asks mom sometimes but can do with difficulty    Status  Achieved  Plan - 02/26/20 1316    Clinical Impression Statement  Patient has plateaued with progress in PT.  She continues to have bilateral UE weakness (hands and shoulders) She has a full HEP needed cues today due to being tired.  Pain is controlled unless she tries lifting and reaching back.  Urged to continue exercises and activity to  maintain her independence.  DC from PT today.    PT Next Visit Plan  NA, DC    PT Home Exercise Plan  seated ER stretch , wall wash, scapular retraction, ER, horizontal abd yellow band, towel squeeze,Add row, extension has red band    Consulted and Agree with Plan of Care  Patient       Patient will benefit from skilled therapeutic intervention in order to improve the following deficits and impairments:     Visit Diagnosis: Stiffness of left shoulder, not elsewhere classified  Stiffness of right shoulder, not elsewhere classified  Chronic left shoulder pain  Muscle weakness (generalized)  Chronic right shoulder pain     Problem List Patient Active Problem List   Diagnosis Date Noted  . Nondisplaced fracture of lateral malleolus of left fibula, initial encounter for closed fracture 10/25/2017  . Insomnia 03/17/2013  . Headache 02/23/2013  . Hx of yeast infection 04/20/2012  . Hx of menorrhagia 04/20/2012  . 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 Bipolar disorder ([place]) 04/20/2012  . Hx of menorrhagia 04/20/2012  . Hx of ovarian cyst 04/20/2012  . Epilepsy ([place]) 04/20/2012  . Hx of seizure disorder 04/20/2012  . 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 Hx of dysmenorrhea 04/20/2012  . 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 Hx of hypercholesterolemia 04/20/2012  . Hx of PMS (premenstrual syndrome) 04/20/2012  . Encounter for therapeutic drug monitoring 05/01/2011    Hamish Banks 02/26/2020, 1:20 PM  [facility] [place], [place], [address] Phone: [phone]   Fax:  [phone]  Name: AYLAH YEARY MRN: [id number] Date of Birth: May 31, 1966   Raeford Razor, PT 02/26/20 1:20 PM Phone: [phone] Fax: [phone]

## 2020-02-27 ENCOUNTER — Other Ambulatory Visit: Payer: Self-pay | Admitting: *Deleted

## 2020-02-27 MED ORDER — PRAMIPEXOLE DIHYDROCHLORIDE 0.25 MG PO TABS: ORAL_TABLET | ORAL | 1 refills | Status: DC

## 2020-03-06 ENCOUNTER — Ambulatory Visit: Payer: Medicare Other | Admitting: Physical Therapy

## 2020-03-11 ENCOUNTER — Telehealth: Payer: Self-pay | Admitting: Neurology

## 2020-03-11 ENCOUNTER — Ambulatory Visit: Payer: Medicare Other | Admitting: Physical Therapy

## 2020-03-11 ENCOUNTER — Ambulatory Visit: Payer: Medicare Other | Admitting: Orthopedic Surgery

## 2020-03-11 NOTE — Telephone Encounter (Signed)
I called pt, no answer, left a message asking her to call us back.  It does not appear that we have ever prescribed vraylar for her. She should contact the prescribing office. If pt calls back please advise her of this.

## 2020-03-11 NOTE — Telephone Encounter (Signed)
Pt has called for a refill on her Cariprazine HCl (VRAYLAR PO) to  [place] [facility]

## 2020-03-11 NOTE — Telephone Encounter (Signed)
Pt called wanting to know why her Cariprazine HCl (VRAYLAR PO) has been denied. Please advise.

## 2020-03-12 NOTE — Telephone Encounter (Signed)
Pt called back and the message from [place], [place] was relayed to pt. No call back requested

## 2020-03-12 NOTE — Telephone Encounter (Signed)
I called pt. No answer, left a message asking her to call back.  As the phone note from yesterday indicated [facility] does not prescribe vraylar for her. She should contact the prescribing office. Please explain this to pt if she calls back.

## 2020-03-20 ENCOUNTER — Ambulatory Visit: Payer: Medicare Other | Admitting: Orthopedic Surgery

## 2020-04-04 ENCOUNTER — Ambulatory Visit: Payer: Medicare Other | Admitting: Orthopedic Surgery

## 2020-04-06 ENCOUNTER — Other Ambulatory Visit: Payer: Self-pay | Admitting: Neurology

## 2020-04-07 ENCOUNTER — Other Ambulatory Visit: Payer: Self-pay | Admitting: Neurology

## 2020-04-08 NOTE — Telephone Encounter (Signed)
Pt is up to date on her appts. Pt is due for a refill on belsomra. [place] Controlled Substance Registry checked and is appropriate.

## 2020-04-08 NOTE — Telephone Encounter (Signed)
RV 07-09-20 SS/NP

## 2020-05-09 ENCOUNTER — Ambulatory Visit
Admission: EM | Admit: 2020-05-09 | Discharge: 2020-05-09 | Disposition: A | Discharge status: Home / Self Care | Payer: Medicare Other | Attending: Family Medicine | Admitting: Family Medicine

## 2020-05-09 ENCOUNTER — Other Ambulatory Visit: Payer: Self-pay

## 2020-05-09 ENCOUNTER — Encounter: Payer: Self-pay | Admitting: Emergency Medicine

## 2020-05-09 DIAGNOSIS — N3001 Acute cystitis with hematuria: Secondary | ICD-10-CM | POA: Diagnosis not present

## 2020-05-09 LAB — POCT URINALYSIS DIP (MANUAL ENTRY)
Bilirubin, UA: NEGATIVE
Glucose, UA: NEGATIVE mg/dL
Ketones, POC UA: NEGATIVE mg/dL
Leukocytes, UA: NEGATIVE
Nitrite, UA: NEGATIVE
Protein Ur, POC: NEGATIVE mg/dL
Spec Grav, UA: 1.025 (ref 1.010–1.025)
Urobilinogen, UA: 0.2 E.U./dL
pH, UA: 7 (ref 5.0–8.0)

## 2020-05-09 MED ORDER — CIPROFLOXACIN HCL 250 MG PO TABS: 250.0000 mg | ORAL_TABLET | Freq: Two times a day (BID) | ORAL | 0 refills | Status: DC

## 2020-05-09 NOTE — Discharge Instructions (Addendum)
Drink more water Take antibiotic twice a day for 5 days and return if symptoms or not improved

## 2020-05-09 NOTE — ED Triage Notes (Signed)
Pt presents to [facility] for assessment of urinary frequency, urgency, with low output x 2 weeks.  Denies pain.

## 2020-05-09 NOTE — ED Provider Notes (Signed)
[name]    CSN: [id number] Arrival date & time: 05/09/20  1429      History   Chief Complaint Chief Complaint  Patient presents with  . Dysuria    HPI Teresa Lawrence is a 54 y.o. female.   Patient presents with a 2-week history of urinary frequency and urgency.  She was seen here about 6 months ago with similar symptoms.  At that time urinalysis was negative except for some blood cells but culture grew out Klebsiella.  She denies any fever chills nausea vomiting or flank pain. HPI  Past Medical History:  Diagnosis Date  . Abnormal Pap smear   . Anxiety   . Bipolar disorder ([place])   . Dysmenorrhea   . Dysplasia   . Dysrhythmia    episodes of palpitations  . Dysuria   . H/O head injury   . Headache(784.0)   . High blood cholesterol   . HSV-2 infection   . Hx of hypercholesterolemia   . Hx of ovarian cyst   . Hx of seizure disorder   . Mass of ovary    left side  . Menorrhagia   . Mental retardation    mild per Dr. notes in [facility]  . Mild mental retardation   . Perimenopausal vasomotor symptoms   . PMS (premenstrual syndrome)   . PMS (premenstrual syndrome)   . PMS (premenstrual syndrome)   . RLS (restless legs syndrome)   . Seizures ([place])   . Uterine mass   . Vaginal yeast infection 2007   recurrent   . Yeast vaginitis     Patient Active Problem List   Diagnosis Date Noted  . Nondisplaced fracture of lateral malleolus of left fibula, initial encounter for closed fracture 10/25/2017  . Insomnia 03/17/2013  . Headache 02/23/2013  . Hx of yeast infection 04/20/2012  . Hx of menorrhagia 04/20/2012  . 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 Bipolar disorder ([place]) 04/20/2012  . Hx of menorrhagia 04/20/2012  . Hx of ovarian cyst 04/20/2012  . Epilepsy ([place]) 04/20/2012  . Hx of seizure disorder 04/20/2012  . 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 Hx of dysmenorrhea 04/20/2012  . 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 Hx of  hypercholesterolemia 04/20/2012  . Hx of PMS (premenstrual syndrome) 04/20/2012  . Encounter for therapeutic drug monitoring 05/01/2011    Past Surgical History:  Procedure Laterality Date  . ABDOMINAL HYSTERECTOMY  2013  . COLPOSCOPY    . CYSTOSCOPY Bilateral 05/30/2013   Procedure: CYSTOSCOPY;  Surgeon: Betsy Coder, MD;  Location: [facility];  Service: Gynecology;  Laterality: Bilateral;  . DILATION AND CURETTAGE OF UTERUS    . ENDOMETRIAL BIOPSY    . HYSTEROSCOPY W/ ENDOMETRIAL ABLATION    . LAPAROSCOPIC HYSTERECTOMY Right 05/30/2013   Procedure: HYSTERECTOMY TOTAL LAPAROSCOPIC;  Surgeon: Betsy Coder, MD;  Location: [facility];  Service: Gynecology;  Laterality: Right;  Right Salpingectomy  . POLYPECTOMY    . WISDOM TOOTH EXTRACTION      OB History   No obstetric history on file.      Home Medications    Prior to Admission medications   Medication Sig Start Date End Date Taking? Authorizing Provider  acyclovir (ZOVIRAX) 200 MG capsule Take 200 mg by mouth 3 (three) times daily as needed (fever blisters).     [provider]  atorvastatin (LIPITOR) 20 MG tablet Take 1 tablet (20 mg total) by mouth daily. 08/04/17   Teresa Ducking,  MD  BELSOMRA 20 MG TABS TAKE 1 TABLET BY MOUTH AT BEDTIME AS NEEDED 04/08/20   [name], NP  benzonatate (TESSALON) 100 MG capsule Take 1 capsule (100 mg total) by mouth every 8 (eight) hours. 02/20/20   [name], [name], PA-C  Boric Acid POWD Place 1 capsule vaginally See admin instructions. As directed for yeast infections    [provider]  Cariprazine HCl (VRAYLAR PO) Take by mouth.    [provider]  cetirizine (ZYRTEC ALLERGY) 10 MG tablet Take 1 tablet (10 mg total) by mouth daily. 02/20/20   [name], [name], PA-C  clonazePAM (KLONOPIN) 2 MG tablet TAKE 1 TABLET BY MOUTH IN THE MORNING AND 1 TABLET IN THE AFTERNOON AND 2 TABLETS AT BEDTIME 04/08/20   [name], NP  diphenhydrAMINE (BENADRYL) 25 MG  tablet Take 25 mg by mouth every 6 (six) hours as needed for allergies.    [provider]  fluticasone (FLONASE) 50 MCG/ACT nasal spray Place 1 spray into both nostrils daily. 02/20/20   [name], [name], PA-C  furosemide (LASIX) 20 MG tablet Take 1 tablet (20 mg total) by mouth daily for 5 days. 12/05/18 12/10/18  Wieters, Hallie C, PA-C  gabapentin (NEURONTIN) 100 MG capsule TAKE 1 CAPSULE BY MOUTH TWO TIMES DAILY 11/23/19   [name], NP  hydrocortisone cream 1 % Apply 1 application topically as needed (itching).    [provider]  ibuprofen (ADVIL,MOTRIN) 600 MG tablet Take 1 tablet (600 mg total) by mouth every 6 (six) hours as needed (mild pain). 05/31/13   Earnstine Regal, PA-C  loperamide (IMODIUM) 2 MG capsule Take 2 mg by mouth 4 (four) times daily as needed for diarrhea or loose stools.    [provider]  magic mouthwash SOLN 15 mLs by Mouth Rinse route 2 (two) times daily as needed for mouth pain.  11/08/17   [provider]  meloxicam (MOBIC) 15 MG tablet Take 1 tablet (15 mg total) by mouth daily. 11/29/19   [name], MD  meloxicam (MOBIC) 7.5 MG tablet Take 1 tablet (7.5 mg total) by mouth daily. 04/25/19   [name], [name], PA-C  pantoprazole (PROTONIX) 40 MG tablet Take 40 mg by mouth 2 (two) times daily as needed (for reflux symptoms).     [provider]  PHENobarbital (LUMINAL) 64.8 MG tablet Take 1 tablet by mouth twice daily 10/26/19   [name], [name], MD  polyethylene glycol (MIRALAX) 17 g packet Take 17 g by mouth daily. 10/10/19   [name], [name], PA-C  pramipexole (MIRAPEX) 0.25 MG tablet TAKE 1 TABLET BY MOUTH TWICE DAILY AND 2 TABLETS AT BEDTIME 02/27/20   [name], NP  predniSONE (DELTASONE) 20 MG tablet Take 1 tablet (20 mg total) by mouth daily. 02/20/20   [name], [name], PA-C  promethazine (PHENERGAN) 25 MG tablet Take 25 mg by mouth every 6 (six) hours as needed for nausea or vomiting.    [provider]    QUEtiapine (SEROQUEL) 100 MG tablet TAKE 1 & 1/2 (ONE & ONE-HALF) TABLETS BY MOUTH AT BEDTIME 11/23/19   [name], NP    Family History Family History  Problem Relation Age of Onset  . Heart attack Father   . Breast cancer Sister     Social History Social History   Tobacco Use  . Smoking status: Former Research scientist (life sciences)  . Smokeless tobacco: Never Used  . Tobacco comment: Quit 10 years ago.  Substance Use Topics  .  Alcohol use: No    Alcohol/week: 0.0 standard drinks  . Drug use: No     Allergies   Requip [ropinirole hcl], Topamax [topiramate], and Zonegran [zonisamide]   Review of Systems Review of Systems  Constitutional: Negative for fever.  Genitourinary: Positive for frequency and urgency. Negative for flank pain.  All other systems reviewed and are negative.    Physical Exam Triage Vital Signs ED Triage Vitals  Enc Vitals Group     BP 05/09/20 1447 (!) 136/80     Pulse Rate 05/09/20 1447 84     Resp 05/09/20 1447 18     Temp 05/09/20 1447 99.4 F (37.4 C)     Temp Source 05/09/20 1447 Oral     SpO2 05/09/20 1447 95 %     Weight --      Height --      Head Circumference --      Peak Flow --      Pain Score 05/09/20 1445 0     Pain Loc --      Pain Edu? --      Excl. in [place]? --    No data found.  Updated Vital Signs BP (!) 136/80 (BP Location: Left Arm)   Pulse 84   Temp 99.4 F (37.4 C) (Oral)   Resp 18   LMP 05/11/2013   SpO2 95%   Visual Acuity Right Eye Distance:   Left Eye Distance:   Bilateral Distance:    Right Eye Near:   Left Eye Near:    Bilateral Near:     Physical Exam Vitals and nursing note reviewed.  Constitutional:      Appearance: Normal appearance. She is obese.  Cardiovascular:     Rate and Rhythm: Normal rate and regular rhythm.  Pulmonary:     Effort: Pulmonary effort is normal.     Breath sounds: Normal breath sounds.  Abdominal:     Comments: No CVA tenderness  Urinalysis shows some red blood cells but no  leukocytes or bacteria.  Neurological:     Mental Status: She is alert.      UC Treatments / Results  Labs (all labs ordered are listed, but only abnormal results are displayed) Labs Reviewed  POCT URINALYSIS DIP (MANUAL ENTRY) - Abnormal; Notable for the following components:      Result Value   Blood, UA moderate (*)    All other components within normal limits    EKG   Radiology No results found.  Procedures Procedures (including critical care time)  Medications Ordered in UC Medications - No data to display  Initial Impression / Assessment and Plan / UC Course  I have reviewed the triage vital signs and the nursing notes.  Pertinent labs & imaging results that were available during my care of the patient were reviewed by me and considered in my medical decision making (see chart for details).     Given similar symptoms and presentation 6 months ago I suspect this is a recurrence of Klebsiella UTI.  Will treat with Cipro based on previous sensitivities.  Of also asked her to drink more water. Final Clinical Impressions(s) / UC Diagnoses   Final diagnoses:  None   Discharge Instructions   None    ED Prescriptions    None     PDMP not reviewed this encounter.   Wardell Honour, MD 05/09/20 1504

## 2020-05-13 ENCOUNTER — Other Ambulatory Visit: Payer: Self-pay

## 2020-05-13 ENCOUNTER — Encounter: Payer: Self-pay | Admitting: Emergency Medicine

## 2020-05-13 ENCOUNTER — Ambulatory Visit
Admission: EM | Admit: 2020-05-13 | Discharge: 2020-05-13 | Disposition: A | Discharge status: Home / Self Care | Payer: Medicare Other

## 2020-05-13 DIAGNOSIS — B351 Tinea unguium: Secondary | ICD-10-CM

## 2020-05-13 DIAGNOSIS — M7989 Other specified soft tissue disorders: Secondary | ICD-10-CM

## 2020-05-13 NOTE — ED Notes (Signed)
Patient able to ambulate independently  

## 2020-05-13 NOTE — ED Provider Notes (Signed)
[name]    CSN: [id number] Arrival date & time: 05/13/20  1453      History   Chief Complaint Chief Complaint  Patient presents with  . Rash    HPI Teresa Lawrence is a 54 y.o. female.   Pt is a 54 year old female presenting with yellowing of toenails.  This is a chronic issue. Tried antifungal cream without relief of symptoms. Reports chronic swelling bilateral lower extremities, right worse than left. Denies pain.  Mild diffuse tenderness to bilateral lower extremities worse on the right.  No calf pain, tenderness.  ROS per HPI      Past Medical History:  Diagnosis Date  . Abnormal Pap smear   . Anxiety   . Bipolar disorder ([place])   . Dysmenorrhea   . Dysplasia   . Dysrhythmia    episodes of palpitations  . Dysuria   . H/O head injury   . Headache(784.0)   . High blood cholesterol   . HSV-2 infection   . Hx of hypercholesterolemia   . Hx of ovarian cyst   . Hx of seizure disorder   . Mass of ovary    left side  . Menorrhagia   . Mental retardation    mild per Dr. notes in [facility]  . Mild mental retardation   . Perimenopausal vasomotor symptoms   . PMS (premenstrual syndrome)   . PMS (premenstrual syndrome)   . PMS (premenstrual syndrome)   . RLS (restless legs syndrome)   . Seizures ([place])   . Uterine mass   . Vaginal yeast infection 2007   recurrent   . Yeast vaginitis     Patient Active Problem List   Diagnosis Date Noted  . Acute cystitis with hematuria 05/09/2020  . Nondisplaced fracture of lateral malleolus of left fibula, initial encounter for closed fracture 10/25/2017  . Insomnia 03/17/2013  . Headache 02/23/2013  . Hx of yeast infection 04/20/2012  . Hx of menorrhagia 04/20/2012  . 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 Bipolar disorder ([place]) 04/20/2012  . Hx of menorrhagia 04/20/2012  . Hx of ovarian cyst 04/20/2012  . Epilepsy ([place]) 04/20/2012  . Hx of seizure disorder 04/20/2012  . Hx of Dysplasia  04/20/2012  . Hx of dysmenorrhea 04/20/2012  . 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 Hx of hypercholesterolemia 04/20/2012  . Hx of PMS (premenstrual syndrome) 04/20/2012  . Encounter for therapeutic drug monitoring 05/01/2011    Past Surgical History:  Procedure Laterality Date  . ABDOMINAL HYSTERECTOMY  2013  . COLPOSCOPY    . CYSTOSCOPY Bilateral 05/30/2013   Procedure: CYSTOSCOPY;  Surgeon: Betsy Coder, MD;  Location: [facility];  Service: Gynecology;  Laterality: Bilateral;  . DILATION AND CURETTAGE OF UTERUS    . ENDOMETRIAL BIOPSY    . HYSTEROSCOPY W/ ENDOMETRIAL ABLATION    . LAPAROSCOPIC HYSTERECTOMY Right 05/30/2013   Procedure: HYSTERECTOMY TOTAL LAPAROSCOPIC;  Surgeon: Betsy Coder, MD;  Location: [facility];  Service: Gynecology;  Laterality: Right;  Right Salpingectomy  . POLYPECTOMY    . WISDOM TOOTH EXTRACTION      OB History   No obstetric history on file.      Home Medications    Prior to Admission medications   Medication Sig Start Date End Date Taking? Authorizing Provider  acyclovir (ZOVIRAX) 200 MG capsule Take 200 mg by mouth 3 (three) times daily as needed (fever blisters).     [provider]  atorvastatin (LIPITOR) 20 MG tablet  Take 1 tablet (20 mg total) by mouth daily. 08/04/17   [name], MD  BELSOMRA 20 MG TABS TAKE 1 TABLET BY MOUTH AT BEDTIME AS NEEDED 04/08/20   [name], NP  benzonatate (TESSALON) 100 MG capsule Take 1 capsule (100 mg total) by mouth every 8 (eight) hours. 02/20/20   [name], [name], PA-C  Boric Acid POWD Place 1 capsule vaginally See admin instructions. As directed for yeast infections    [provider]  Cariprazine HCl (VRAYLAR PO) Take by mouth.    [provider]  cetirizine (ZYRTEC ALLERGY) 10 MG tablet Take 1 tablet (10 mg total) by mouth daily. 02/20/20   [name], [name], PA-C  ciprofloxacin (CIPRO) 250 MG tablet Take 1 tablet (250 mg total) by mouth every 12 (twelve)  hours. 05/09/20   Wardell Honour, MD  clonazePAM (KLONOPIN) 2 MG tablet TAKE 1 TABLET BY MOUTH IN THE MORNING AND 1 TABLET IN THE AFTERNOON AND 2 TABLETS AT BEDTIME 04/08/20   [name], NP  diphenhydrAMINE (BENADRYL) 25 MG tablet Take 25 mg by mouth every 6 (six) hours as needed for allergies.    [provider]  fluticasone (FLONASE) 50 MCG/ACT nasal spray Place 1 spray into both nostrils daily. 02/20/20   [name], [name], PA-C  furosemide (LASIX) 20 MG tablet Take 1 tablet (20 mg total) by mouth daily for 5 days. 12/05/18 12/10/18  Wieters, Hallie C, PA-C  gabapentin (NEURONTIN) 100 MG capsule TAKE 1 CAPSULE BY MOUTH TWO TIMES DAILY 11/23/19   [name], NP  hydrocortisone cream 1 % Apply 1 application topically as needed (itching).    [provider]  ibuprofen (ADVIL,MOTRIN) 600 MG tablet Take 1 tablet (600 mg total) by mouth every 6 (six) hours as needed (mild pain). 05/31/13   Earnstine Regal, PA-C  loperamide (IMODIUM) 2 MG capsule Take 2 mg by mouth 4 (four) times daily as needed for diarrhea or loose stools.    [provider]  magic mouthwash SOLN 15 mLs by Mouth Rinse route 2 (two) times daily as needed for mouth pain.  11/08/17   [provider]  meloxicam (MOBIC) 15 MG tablet Take 1 tablet (15 mg total) by mouth daily. 11/29/19   [name], MD  meloxicam (MOBIC) 7.5 MG tablet Take 1 tablet (7.5 mg total) by mouth daily. 04/25/19   [name], [name], PA-C  pantoprazole (PROTONIX) 40 MG tablet Take 40 mg by mouth 2 (two) times daily as needed (for reflux symptoms).     [provider]  PHENobarbital (LUMINAL) 64.8 MG tablet Take 1 tablet by mouth twice daily 10/26/19   [name], [name], MD  polyethylene glycol (MIRALAX) 17 g packet Take 17 g by mouth daily. 10/10/19   [name], [name], PA-C  pramipexole (MIRAPEX) 0.25 MG tablet TAKE 1 TABLET BY MOUTH TWICE DAILY AND 2 TABLETS AT BEDTIME 02/27/20   [name], NP  promethazine (PHENERGAN) 25  MG tablet Take 25 mg by mouth every 6 (six) hours as needed for nausea or vomiting.    [provider]  QUEtiapine (SEROQUEL) 100 MG tablet TAKE 1 & 1/2 (ONE & ONE-HALF) TABLETS BY MOUTH AT BEDTIME 11/23/19   [name], NP    Family History Family History  Problem Relation Age of Onset  . Heart attack Father   . Breast cancer Sister     Social History Social History   Tobacco Use  . Smoking status: Former Research scientist (life sciences)  .  Smokeless tobacco: Never Used  . Tobacco comment: Quit 10 years ago.  Substance Use Topics  . Alcohol use: No    Alcohol/week: 0.0 standard drinks  . Drug use: No     Allergies   Requip [ropinirole hcl], Topamax [topiramate], and Zonegran [zonisamide]   Review of Systems Review of Systems  Constitutional: Negative.   HENT: Negative.   Eyes: Negative.   Respiratory: Negative.   Cardiovascular: Positive for leg swelling.  Skin:       Yellowing of toenails     Physical Exam Triage Vital Signs ED Triage Vitals  Enc Vitals Group     BP 05/13/20 1505 123/85     Pulse Rate 05/13/20 1505 91     Resp 05/13/20 1505 18     Temp 05/13/20 1505 98.6 F (37 C)     Temp Source 05/13/20 1505 Oral     SpO2 05/13/20 1505 92 %     Weight --      Height --      Head Circumference --      Peak Flow --      Pain Score 05/13/20 1507 0     Pain Loc --      Pain Edu? --      Excl. in [place]? --    No data found.  Updated Vital Signs BP 123/85 (BP Location: Left Arm)   Pulse 91   Temp 98.6 F (37 C) (Oral)   Resp 18   LMP 05/11/2013   SpO2 92%   Visual Acuity Right Eye Distance:   Left Eye Distance:   Bilateral Distance:    Right Eye Near:   Left Eye Near:    Bilateral Near:     Physical Exam Musculoskeletal:     Right lower leg: Swelling present. 2+ Pitting Edema present.     Left lower leg: 1+ Pitting Edema present.     Right foot: Decreased capillary refill.     Left foot: Decreased capillary refill.       Legs:     Comments: Purple  discoloration right lower calf 2+ pedal pulse on left and 1+ pedal pulse on the right   Feet:     Right foot:     Toenail Condition: Fungal disease present.    Left foot:     Toenail Condition: Fungal disease present.     UC Treatments / Results  Labs (all labs ordered are listed, but only abnormal results are displayed) Labs Reviewed - No data to display  EKG   Radiology No results found.  Procedures Procedures (including critical care time)  Medications Ordered in UC Medications - No data to display  Initial Impression / Assessment and Plan / UC Course  I have reviewed the triage vital signs and the nursing notes.  Pertinent labs & imaging results that were available during my care of the patient were reviewed by me and considered in my medical decision making (see chart for details).     Leg swelling Concerns for peripheral vascular disease based on discoloration and swelling No concern for DVT at this time.  Had DVT study for similar problem in the past which was negative. No current calf pain.  This is a chronic issue that comes and goes for her.  She reports the discoloration is worsening especially on the right. Some small ulcer that are healing.  Temperature normal today.  She has not had any calf pain or swelling.  Recommended follow-up with her doctor  for further management of this.  Onychomycosis This is a chronic issue that has failed over-the-counter treatment.  Patient most likely is to be on terbinafine for a longer period of time.  Patient made aware she needs to have her liver function monitored during this treatment.  Recommended follow-up with her primary care for further management Final Clinical Impressions(s) / UC Diagnoses   Final diagnoses:  Leg swelling  Onychomycosis     Discharge Instructions     Please follow-up with your primary care doctor about the leg swelling, discoloration of your legs especially the right leg.  This may be  some sort of vascular issue You also need to see them about your toenails.  You may need to be on medicine for 6 months or more for treatment for this.  This needs to be monitored by primary care doctor. Follow up as needed for continued or worsening symptoms     ED Prescriptions    None     PDMP not reviewed this encounter.   Orvan July, NP 05/13/20 1649

## 2020-05-13 NOTE — ED Triage Notes (Signed)
Pt presents to [facility] for assessment of her toenails turning yellow x 2-3 weeks.  Patient denies itchiness, denies pain.

## 2020-05-13 NOTE — Discharge Instructions (Addendum)
Please follow-up with your primary care doctor about the leg swelling, discoloration of your legs especially the right leg.  This may be some sort of vascular issue You also need to see them about your toenails.  You may need to be on medicine for 6 months or more for treatment for this.  This needs to be monitored by primary care doctor. Follow up as needed for continued or worsening symptoms

## 2020-05-19 ENCOUNTER — Other Ambulatory Visit: Payer: Self-pay | Admitting: Neurology

## 2020-05-24 ENCOUNTER — Other Ambulatory Visit: Payer: Self-pay | Admitting: Diagnostic Neuroimaging

## 2020-05-27 ENCOUNTER — Encounter: Payer: Self-pay | Admitting: Neurology

## 2020-05-27 ENCOUNTER — Ambulatory Visit (INDEPENDENT_AMBULATORY_CARE_PROVIDER_SITE_OTHER): Payer: Medicare Other | Admitting: Neurology

## 2020-05-27 VITALS — BP 114/73 | HR 78 | Ht 61.0 in | Wt 238.0 lb

## 2020-05-27 DIAGNOSIS — G40909 Epilepsy, unspecified, not intractable, without status epilepticus: Secondary | ICD-10-CM

## 2020-05-27 DIAGNOSIS — Z8669 Personal history of other diseases of the nervous system and sense organs: Secondary | ICD-10-CM

## 2020-05-27 DIAGNOSIS — R519 Headache, unspecified: Secondary | ICD-10-CM

## 2020-05-27 DIAGNOSIS — G2581 Restless legs syndrome: Secondary | ICD-10-CM

## 2020-05-27 DIAGNOSIS — F319 Bipolar disorder, unspecified: Secondary | ICD-10-CM

## 2020-05-27 MED ORDER — GABAPENTIN 100 MG PO CAPS: ORAL_CAPSULE | ORAL | 1 refills | Status: DC

## 2020-05-27 MED ORDER — PHENOBARBITAL 64.8 MG PO TABS: 64.8000 mg | ORAL_TABLET | Freq: Two times a day (BID) | ORAL | 1 refills | Status: DC

## 2020-05-27 MED ORDER — PRAMIPEXOLE DIHYDROCHLORIDE 0.25 MG PO TABS: ORAL_TABLET | ORAL | 1 refills | Status: DC

## 2020-05-27 NOTE — Patient Instructions (Signed)
Continue current medications  Consider taking Belsomra just as needed  Check blood work today  See you back in 6 months  Continue follow-up with your primary doctor and psychiatrist

## 2020-05-27 NOTE — Progress Notes (Signed)
PATIENT: Teresa Lawrence DOB: [birth year]  REASON FOR VISIT: follow up HISTORY FROM: patient  HISTORY OF PRESENT ILLNESS: Today 05/27/20 Teresa Lawrence 54 year old female history of headaches, bipolar disorder, and seizure disorder. Last seizure in January 2019. She is on phenobarbital for seizures, clonazepam for mood swings. She takes gabapentin for headaches. She is on Belsomra for sleep, Mirapex for restless leg syndrome. Also taking Seroquel for mood swings.  Continues to see psychiatry, indicates Seroquel dose has been reduced, is also on Vraylar.  Her headaches remain under good control.  She seems to sleep well.  She lives with her mom.  Denies any new problems or concerns. Mentions that she takes a lot of medications, in the past she has tried to wean off, says she had withdrawals. No falls, balance is okay. She doesn't work or drive a car, she is fairly sedentary. Presents today for follow-up unaccompanied.  HISTORY  11/23/2019 SS: Teresa Lawrence is a 54 year old female with history of headaches, bipolar disorder, and seizure disorder.  Her last seizure occurred in January 2019.  She is taking phenobarbital 64.8 mg twice daily, clonazepam 2 mg twice daily and 2 tablets at bedtime for mood swings.  She takes gabapentin for her headaches.  She is also prescribed Belsomra for sleep, and Mirapex for restless leg syndrome.  She is also taking Seroquel at bedtime for mood swings.  She has seen psychiatry in the past, but has not seen them recently.  She has not had recurrent seizure.  In general, she sleeps well.  Her headaches have been under good control, as long as she takes gabapentin.  She is dealing with bilateral frozen shoulders, is currently in physical therapy.  She lives with her mom, and 2 cats.  She does not work or drive a car.  She presents today for evaluation unaccompanied.  REVIEW OF SYSTEMS: Out of a complete 14 system review of symptoms, the patient complains only of the following  symptoms, and all other reviewed systems are negative.  Seizures, insomnia, restless legs, headache  ALLERGIES: Allergies  Allergen Reactions   Requip [Ropinirole Hcl] Nausea And Vomiting   Topamax [Topiramate] Nausea Only   Zonegran [Zonisamide] Nausea Only    HOME MEDICATIONS: Outpatient Medications Prior to Visit  Medication Sig Dispense Refill   acyclovir (ZOVIRAX) 200 MG capsule Take 200 mg by mouth 3 (three) times daily as needed (fever blisters).      atorvastatin (LIPITOR) 20 MG tablet Take 1 tablet (20 mg total) by mouth daily.     BELSOMRA 20 MG TABS TAKE 1 TABLET BY MOUTH AT BEDTIME AS NEEDED 30 tablet 1   benzonatate (TESSALON) 100 MG capsule Take 1 capsule (100 mg total) by mouth every 8 (eight) hours. 21 capsule 0   Cariprazine HCl (VRAYLAR PO) Take by mouth.     cetirizine (ZYRTEC ALLERGY) 10 MG tablet Take 1 tablet (10 mg total) by mouth daily. 30 tablet 0   clonazePAM (KLONOPIN) 2 MG tablet TAKE 1 TABLET BY MOUTH ONCE DAILY IN THE MORNING AND 1 ONCE DAILY IN THE AFTERNOON AND 2 EVERY DAY AT BEDTIME 120 tablet 0   diphenhydrAMINE (BENADRYL) 25 MG tablet Take 25 mg by mouth every 6 (six) hours as needed for allergies.     fluticasone (FLONASE) 50 MCG/ACT nasal spray Place 1 spray into both nostrils daily. 16 g 0   hydrocortisone cream 1 % Apply 1 application topically as needed (itching).     ibuprofen (ADVIL,MOTRIN) 600 MG tablet  Take 1 tablet (600 mg total) by mouth every 6 (six) hours as needed (mild pain). 30 tablet 1   loperamide (IMODIUM) 2 MG capsule Take 2 mg by mouth 4 (four) times daily as needed for diarrhea or loose stools.     magic mouthwash SOLN 15 mLs by Mouth Rinse route 2 (two) times daily as needed for mouth pain.   3   meloxicam (MOBIC) 7.5 MG tablet Take 1 tablet (7.5 mg total) by mouth daily. 10 tablet 0   pantoprazole (PROTONIX) 40 MG tablet Take 40 mg by mouth 2 (two) times daily as needed (for reflux symptoms).      promethazine  (PHENERGAN) 25 MG tablet Take 25 mg by mouth every 6 (six) hours as needed for nausea or vomiting.     QUEtiapine (SEROQUEL) 100 MG tablet TAKE 1 & 1/2 (ONE & ONE-HALF) TABLETS BY MOUTH AT BEDTIME 45 tablet 3   ciprofloxacin (CIPRO) 250 MG tablet Take 1 tablet (250 mg total) by mouth every 12 (twelve) hours. 10 tablet 0   gabapentin (NEURONTIN) 100 MG capsule TAKE 1 CAPSULE BY MOUTH TWO TIMES DAILY 180 capsule 1   PHENobarbital (LUMINAL) 64.8 MG tablet Take 1 tablet by mouth twice daily 180 tablet 1   pramipexole (MIRAPEX) 0.25 MG tablet TAKE 1 TABLET BY MOUTH TWICE DAILY AND 2 TABLETS AT BEDTIME 360 tablet 1   furosemide (LASIX) 20 MG tablet Take 1 tablet (20 mg total) by mouth daily for 5 days. 5 tablet 0   Boric Acid POWD Place 1 capsule vaginally See admin instructions. As directed for yeast infections     meloxicam (MOBIC) 15 MG tablet Take 1 tablet (15 mg total) by mouth daily. 30 tablet 1   polyethylene glycol (MIRALAX) 17 g packet Take 17 g by mouth daily. 14 each 0   No facility-administered medications prior to visit.    PAST MEDICAL HISTORY: Past Medical History:  Diagnosis Date   Abnormal Pap smear    Anxiety    Bipolar disorder ([place])    Dysmenorrhea    Dysplasia    Dysrhythmia    episodes of palpitations   Dysuria    H/O head injury    Headache(784.0)    High blood cholesterol    HSV-2 infection    Hx of hypercholesterolemia    Hx of ovarian cyst    Hx of seizure disorder    Mass of ovary    left side   Menorrhagia    Mental retardation    mild per Dr. notes in EPIC   Mild mental retardation    Perimenopausal vasomotor symptoms    PMS (premenstrual syndrome)    PMS (premenstrual syndrome)    PMS (premenstrual syndrome)    RLS (restless legs syndrome)    Seizures ([place])    Uterine mass    Vaginal yeast infection 2007   recurrent    Yeast vaginitis     PAST SURGICAL HISTORY: Past Surgical History:  Procedure Laterality  Date   ABDOMINAL HYSTERECTOMY  2013   COLPOSCOPY     CYSTOSCOPY Bilateral 05/30/2013   Procedure: CYSTOSCOPY;  Surgeon: Betsy Coder, MD;  Location: [facility];  Service: Gynecology;  Laterality: Bilateral;   DILATION AND CURETTAGE OF UTERUS     ENDOMETRIAL BIOPSY     HYSTEROSCOPY W/ ENDOMETRIAL ABLATION     LAPAROSCOPIC HYSTERECTOMY Right 05/30/2013   Procedure: HYSTERECTOMY TOTAL LAPAROSCOPIC;  Surgeon: Betsy Coder, MD;  Location: [facility];  Service: Gynecology;  Laterality:  Right;  Right Salpingectomy   POLYPECTOMY     WISDOM TOOTH EXTRACTION      FAMILY HISTORY: Family History  Problem Relation Age of Onset   Heart attack Father    Breast cancer Sister     SOCIAL HISTORY: Social History   Socioeconomic History   Marital status: Single    Spouse name: Not on file   Number of children: 0   Years of education: HS   Highest education level: Not on file  Occupational History   Occupation: Unemployed  Tobacco Use   Smoking status: Former Smoker   Smokeless tobacco: Never Used   Tobacco comment: Quit 10 years ago.  Substance and Sexual Activity   Alcohol use: No    Alcohol/week: 0.0 standard drinks   Drug use: No   Sexual activity: Not on file  Other Topics Concern   Not on file  Social History Narrative   Patient lives at home alone with her one dog and four cats.   Disabled   Education high school.   Right handed.      Social Determinants of Health   Financial Resource Strain:    Difficulty of Paying Living Expenses:   Food Insecurity:    Worried About Charity fundraiser in the Last Year:    Arboriculturist in the Last Year:   Transportation Needs:    Film/video editor (Medical):    Lack of Transportation (Non-Medical):   Physical Activity:    Days of Exercise per Week:    Minutes of Exercise per Session:   Stress:    Feeling of Stress :   Social Connections:    Frequency of Communication with Friends and Family:      Frequency of Social Gatherings with Friends and Family:    Attends Religious Services:    Active Member of Clubs or Organizations:    Attends Archivist Meetings:    Marital Status:   Intimate Partner Violence:    Fear of Current or Ex-Partner:    Emotionally Abused:    Physically Abused:    Sexually Abused:    PHYSICAL EXAM  Vitals:   05/27/20 1052  BP: 114/73  Pulse: 78  Weight: 238 lb (108 kg)  Height: 5\' 1"  (1.549 m)   Body mass index is 44.97 kg/m.  Generalized: Well developed, in no acute distress   Neurological examination  Mentation: Alert oriented to time, place, history taking. Follows all commands, speech is somewhat slurred can understand without difficulty  Cranial nerve II-XII: Pupils were equal round reactive to light. Extraocular movements were full, visual field were full on confrontational test. Facial sensation and strength were normal. Head turning and shoulder shrug were normal and symmetric. Motor: The motor testing reveals 5 over 5 strength of all 4 extremities. Good symmetric motor tone is noted throughout.  Sensory: Sensory testing is intact to soft touch on all 4 extremities. No evidence of extinction is noted.  Coordination: Cerebellar testing reveals good finger-nose-finger and heel-to-shin bilaterally.  Gait and station: Gait is wide-based, tandem gait is unsteady, Romberg is negative. Reflexes: Deep tendon reflexes are symmetric and normal bilaterally.   DIAGNOSTIC DATA (LABS, IMAGING, TESTING) - I reviewed patient records, labs, notes, testing and imaging myself where available.  Lab Results  Component Value Date   WBC 9.1 05/18/2019   HGB 13.1 05/18/2019   HCT 40.7 05/18/2019   MCV 91 05/18/2019   PLT 348 05/18/2019      Component  Value Date/Time   NA 145 (H) 05/18/2019 1319   K 4.8 05/18/2019 1319   CL 101 05/18/2019 1319   CO2 27 05/18/2019 1319   GLUCOSE 93 05/18/2019 1319   GLUCOSE 87 11/18/2017 2235   BUN  7 05/18/2019 1319   CREATININE 0.68 05/18/2019 1319   CALCIUM 10.0 05/18/2019 1319   PROT 7.8 05/18/2019 1319   ALBUMIN 4.3 05/18/2019 1319   AST 15 05/18/2019 1319   ALT 24 05/18/2019 1319   ALKPHOS 148 (H) 05/18/2019 1319   BILITOT 0.3 05/18/2019 1319   GFRNONAA 101 05/18/2019 1319   GFRAA 116 05/18/2019 1319   No results found for: CHOL, HDL, LDLCALC, LDLDIRECT, TRIG, CHOLHDL No results found for: HGBA1C No results found for: VITAMINB12 No results found for: TSH    ASSESSMENT AND PLAN 53 y.o. year old female  has a past medical history of Abnormal Pap smear, Anxiety, Bipolar disorder ([place]), Dysmenorrhea, Dysplasia, Dysrhythmia, Dysuria, H/O head injury, Headache(784.0), High blood cholesterol, HSV-2 infection, hypercholesterolemia, ovarian cyst, seizure disorder, Mass of ovary, Menorrhagia, Mental retardation, Mild mental retardation, Perimenopausal vasomotor symptoms, PMS (premenstrual syndrome), PMS (premenstrual syndrome), PMS (premenstrual syndrome), RLS (restless legs syndrome), Seizures ([place]), Uterine mass, Vaginal yeast infection (2007), and Yeast vaginitis. here with:  1.  Headache 2.  Seizure disorder 3.  Bipolar disorder 4.  RLS  Overall, her conditions have remained stable.  She will remain on her current medications.  I will check routine blood work today.  I will send a refill of phenobarbital, Mirapex, and gabapentin.  She will follow-up in 6 months or sooner if needed, will have her check in with Dr. Jannifer [place] at next visit, for evaluation of long term medications. Her dose of Seroquel has been reduced of recent.   Belsomra 20 mg at bedtime PRN (last fill was 05/11/2020), encouraged her to take PRN as prescribed  Clonazepam 2 mg (1 twice daily, 2 at bedtime) last filled 05/21/2020 (will be due next month), has been reducing to only 3 tablets daily Phenobarbital 64.8, 1 tablet twice daily, refill sent today, last 02/23/2020 Mirapex 0.25 mg, 1 tab twice daily, 2 at  night Gabapentin 100 mg twice a day for headache   I spent 30 minutes of face-to-face and non-face-to-face time with patient.  This included previsit chart review, lab review, study review, order entry, electronic health record documentation, patient education.  Butler Denmark, AGNP-C, DNP 05/27/2020, 12:02 PM [facility] [address] [place], [place] [address] [phone]

## 2020-05-28 LAB — COMPREHENSIVE METABOLIC PANEL
ALT: 13 IU/L (ref 0–32)
AST: 15 IU/L (ref 0–40)
Albumin/Globulin Ratio: 1.3 (ref 1.2–2.2)
Albumin: 4.2 g/dL (ref 3.8–4.9)
Alkaline Phosphatase: 146 IU/L — ABNORMAL HIGH (ref 48–121)
BUN/Creatinine Ratio: 11 (ref 9–23)
BUN: 7 mg/dL (ref 6–24)
Bilirubin Total: 0.2 mg/dL (ref 0.0–1.2)
CO2: 28 mmol/L (ref 20–29)
Calcium: 9.3 mg/dL (ref 8.7–10.2)
Chloride: 104 mmol/L (ref 96–106)
Creatinine, Ser: 0.65 mg/dL (ref 0.57–1.00)
GFR calc Af Amer: 117 mL/min/{1.73_m2} (ref >59)
GFR calc non Af Amer: 102 mL/min/{1.73_m2} (ref >59)
Globulin, Total: 3.3 g/dL (ref 1.5–4.5)
Glucose: 89 mg/dL (ref 65–99)
Potassium: 4.5 mmol/L (ref 3.5–5.2)
Sodium: 144 mmol/L (ref 134–144)
Total Protein: 7.5 g/dL (ref 6.0–8.5)

## 2020-05-28 LAB — CBC WITH DIFFERENTIAL/PLATELET
Basophils Absolute: 0.1 10*3/uL (ref 0.0–0.2)
Basos: 1 %
EOS (ABSOLUTE): 0.3 10*3/uL (ref 0.0–0.4)
Eos: 4 %
Hematocrit: 39.7 % (ref 34.0–46.6)
Hemoglobin: 12.5 g/dL (ref 11.1–15.9)
Immature Grans (Abs): 0 10*3/uL (ref 0.0–0.1)
Immature Granulocytes: 0 %
Lymphocytes Absolute: 1.7 10*3/uL (ref 0.7–3.1)
Lymphs: 24 %
MCH: 28.9 pg (ref 26.6–33.0)
MCHC: 31.5 g/dL (ref 31.5–35.7)
MCV: 92 fL (ref 79–97)
Monocytes Absolute: 0.5 10*3/uL (ref 0.1–0.9)
Monocytes: 7 %
Neutrophils Absolute: 4.7 10*3/uL (ref 1.4–7.0)
Neutrophils: 64 %
Platelets: 296 10*3/uL (ref 150–450)
RBC: 4.32 x10E6/uL (ref 3.77–5.28)
RDW: 12.9 % (ref 11.7–15.4)
WBC: 7.4 10*3/uL (ref 3.4–10.8)

## 2020-05-28 LAB — PHENOBARBITAL LEVEL: Phenobarbital, Serum: 26 ug/mL (ref 15–40)

## 2020-05-28 NOTE — Progress Notes (Signed)
I have read the note, and I agree with the clinical assessment and plan.  Alta Goding K Terrie Haring   

## 2020-05-29 ENCOUNTER — Telehealth: Payer: Self-pay

## 2020-05-29 NOTE — Telephone Encounter (Signed)
-----   Message from [name], NP sent at 05/28/2020  1:50 PM EDT ----- Labs are overall stable.

## 2020-05-29 NOTE — Telephone Encounter (Signed)
Pt was contacted and notified of the message below

## 2020-06-05 ENCOUNTER — Other Ambulatory Visit: Payer: Self-pay | Admitting: Family Medicine

## 2020-06-05 DIAGNOSIS — Z1231 Encounter for screening mammogram for malignant neoplasm of breast: Secondary | ICD-10-CM

## 2020-06-10 ENCOUNTER — Other Ambulatory Visit: Payer: Self-pay | Admitting: Neurology

## 2020-06-14 ENCOUNTER — Ambulatory Visit
Admission: RE | Admit: 2020-06-14 | Discharge: 2020-06-14 | Disposition: A | Discharge status: Home / Self Care | Payer: Medicare Other | Source: Ambulatory Visit | Attending: Family Medicine | Admitting: Family Medicine

## 2020-06-14 ENCOUNTER — Other Ambulatory Visit: Payer: Self-pay

## 2020-06-14 DIAGNOSIS — Z1231 Encounter for screening mammogram for malignant neoplasm of breast: Secondary | ICD-10-CM

## 2020-06-19 ENCOUNTER — Other Ambulatory Visit: Payer: Self-pay | Admitting: Family Medicine

## 2020-06-19 DIAGNOSIS — R928 Other abnormal and inconclusive findings on diagnostic imaging of breast: Secondary | ICD-10-CM

## 2020-07-03 ENCOUNTER — Other Ambulatory Visit: Payer: Medicare Other

## 2020-07-07 ENCOUNTER — Other Ambulatory Visit: Payer: Self-pay | Admitting: Neurology

## 2020-07-08 ENCOUNTER — Ambulatory Visit
Admission: RE | Admit: 2020-07-08 | Discharge: 2020-07-08 | Disposition: A | Discharge status: Home / Self Care | Payer: Medicare Other | Source: Ambulatory Visit | Attending: Family Medicine | Admitting: Family Medicine

## 2020-07-08 ENCOUNTER — Other Ambulatory Visit: Payer: Self-pay

## 2020-07-08 ENCOUNTER — Telehealth: Payer: Self-pay | Admitting: Neurology

## 2020-07-08 DIAGNOSIS — R928 Other abnormal and inconclusive findings on diagnostic imaging of breast: Secondary | ICD-10-CM

## 2020-07-08 NOTE — Telephone Encounter (Signed)
Order for this is rx request.

## 2020-07-08 NOTE — Telephone Encounter (Signed)
Pt called for an update on refill for clonazePAM (KLONOPIN) 2 MG tablet

## 2020-07-25 ENCOUNTER — Ambulatory Visit
Admission: EM | Admit: 2020-07-25 | Discharge: 2020-07-25 | Disposition: A | Discharge status: Home / Self Care | Payer: Medicare Other | Attending: Emergency Medicine | Admitting: Emergency Medicine

## 2020-07-25 ENCOUNTER — Other Ambulatory Visit: Payer: Self-pay

## 2020-07-25 DIAGNOSIS — N390 Urinary tract infection, site not specified: Secondary | ICD-10-CM

## 2020-07-25 LAB — POCT URINALYSIS DIP (MANUAL ENTRY)
Bilirubin, UA: NEGATIVE
Glucose, UA: NEGATIVE mg/dL
Ketones, POC UA: NEGATIVE mg/dL
Nitrite, UA: NEGATIVE
Protein Ur, POC: NEGATIVE mg/dL
Spec Grav, UA: 1.025 (ref 1.010–1.025)
Urobilinogen, UA: 0.2 E.U./dL
pH, UA: 6.5 (ref 5.0–8.0)

## 2020-07-25 MED ORDER — FLUCONAZOLE 150 MG PO TABS: 150.0000 mg | ORAL_TABLET | Freq: Once | ORAL | 0 refills | Status: AC

## 2020-07-25 MED ORDER — CEPHALEXIN 500 MG PO CAPS: 500.0000 mg | ORAL_CAPSULE | Freq: Two times a day (BID) | ORAL | 0 refills | Status: AC

## 2020-07-25 NOTE — ED Provider Notes (Signed)
[name]    CSN: [id number] Arrival date & time: 07/25/20  1626      History   Chief Complaint Chief Complaint  Patient presents with  . Urinary Tract Infection    symptoms x 4 days    HPI Teresa Lawrence is a 54 y.o. female presenting today for evaluation of possible UTI.  Reports over the past week he has had urinary frequency, incomplete voiding and dysuria.  Denies hematuria.  Does report prior UTIs and feels similar.  Has had some slight itching, denies vaginal discharge or irritation.  Reports history of yeast infections.  Denies fevers nausea or vomiting.  Has had some slight back pain, more prominent on right side.  HPI  Past Medical History:  Diagnosis Date  . Abnormal Pap smear   . Anxiety   . Bipolar disorder ([place])   . Dysmenorrhea   . Dysplasia   . Dysrhythmia    episodes of palpitations  . Dysuria   . H/O head injury   . Headache(784.0)   . High blood cholesterol   . HSV-2 infection   . Hx of hypercholesterolemia   . Hx of ovarian cyst   . Hx of seizure disorder   . Mass of ovary    left side  . Menorrhagia   . Mental retardation    mild per Dr. notes in [facility]  . Mild mental retardation   . Perimenopausal vasomotor symptoms   . PMS (premenstrual syndrome)   . PMS (premenstrual syndrome)   . PMS (premenstrual syndrome)   . RLS (restless legs syndrome)   . Seizures ([place])   . Uterine mass   . Vaginal yeast infection 2007   recurrent   . Yeast vaginitis     Patient Active Problem List   Diagnosis Date Noted  . RLS (restless legs syndrome)   . Acute cystitis with hematuria 05/09/2020  . Nondisplaced fracture of lateral malleolus of left fibula, initial encounter for closed fracture 10/25/2017  . Insomnia 03/17/2013  . Headache 02/23/2013  . Hx of yeast infection 04/20/2012  . Hx of menorrhagia 04/20/2012  . 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 Bipolar disorder ([place]) 04/20/2012  . Hx of menorrhagia 04/20/2012    . Hx of ovarian cyst 04/20/2012  . Epilepsy ([employer]) 04/20/2012  . Hx of seizure disorder 04/20/2012  . 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 Hx of dysmenorrhea 04/20/2012  . 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 Hx of hypercholesterolemia 04/20/2012  . Hx of PMS (premenstrual syndrome) 04/20/2012  . Encounter for therapeutic drug monitoring 05/01/2011    Past Surgical History:  Procedure Laterality Date  . ABDOMINAL HYSTERECTOMY  2013  . COLPOSCOPY    . CYSTOSCOPY Bilateral 05/30/2013   Procedure: CYSTOSCOPY;  Surgeon: Betsy Coder, MD;  Location: [facility];  Service: Gynecology;  Laterality: Bilateral;  . DILATION AND CURETTAGE OF UTERUS    . ENDOMETRIAL BIOPSY    . HYSTEROSCOPY W/ ENDOMETRIAL ABLATION    . LAPAROSCOPIC HYSTERECTOMY Right 05/30/2013   Procedure: HYSTERECTOMY TOTAL LAPAROSCOPIC;  Surgeon: Betsy Coder, MD;  Location: [facility];  Service: Gynecology;  Laterality: Right;  Right Salpingectomy  . POLYPECTOMY    . WISDOM TOOTH EXTRACTION      OB History   No obstetric history on file.      Home Medications    Prior to Admission medications   Medication Sig Start Date End Date Taking? Authorizing Provider  BELSOMRA 20 MG TABS TAKE 1  TABLET BY MOUTH AT BEDTIME AS NEEDED 06/10/20   [name], NP  acyclovir (ZOVIRAX) 200 MG capsule Take 200 mg by mouth 3 (three) times daily as needed (fever blisters).     [provider]  atorvastatin (LIPITOR) 20 MG tablet Take 1 tablet (20 mg total) by mouth daily. 08/04/17   [name], MD  benzonatate (TESSALON) 100 MG capsule Take 1 capsule (100 mg total) by mouth every 8 (eight) hours. 02/20/20   [name], [name], PA-C  Cariprazine HCl (VRAYLAR PO) Take by mouth.    [provider]  cephALEXin (KEFLEX) 500 MG capsule Take 1 capsule (500 mg total) by mouth 2 (two) times daily for 7 days. 07/25/20 08/01/20  Dametra Whetsel C, PA-C  cetirizine (ZYRTEC ALLERGY) 10 MG tablet Take 1 tablet (10 mg total) by  mouth daily. 02/20/20   [name], [name], PA-C  clonazePAM (KLONOPIN) 2 MG tablet TAKE 1 TABLET BY MOUTH ONCE DAILY IN THE MORNING AND TAKE 1 ONCE DAILY IN THE AFTERNOON AND TAKE  2 EVERYDAY AT BEDTIME 07/08/20   [name], NP  diphenhydrAMINE (BENADRYL) 25 MG tablet Take 25 mg by mouth every 6 (six) hours as needed for allergies.    [provider]  fluconazole (DIFLUCAN) 150 MG tablet Take 1 tablet (150 mg total) by mouth once for 1 dose. 07/25/20 07/25/20  Zoltan Genest C, PA-C  fluticasone (FLONASE) 50 MCG/ACT nasal spray Place 1 spray into both nostrils daily. 02/20/20   [name], [name], PA-C  furosemide (LASIX) 20 MG tablet Take 1 tablet (20 mg total) by mouth daily for 5 days. 12/05/18 12/10/18  Amberrose Friebel C, PA-C  gabapentin (NEURONTIN) 100 MG capsule TAKE 1 CAPSULE BY MOUTH TWO TIMES DAILY 05/27/20   [name], NP  hydrocortisone cream 1 % Apply 1 application topically as needed (itching).    [provider]  ibuprofen (ADVIL,MOTRIN) 600 MG tablet Take 1 tablet (600 mg total) by mouth every 6 (six) hours as needed (mild pain). 05/31/13   Earnstine Regal, PA-C  loperamide (IMODIUM) 2 MG capsule Take 2 mg by mouth 4 (four) times daily as needed for diarrhea or loose stools.    [provider]  magic mouthwash SOLN 15 mLs by Mouth Rinse route 2 (two) times daily as needed for mouth pain.  11/08/17   [provider]  meloxicam (MOBIC) 7.5 MG tablet Take 1 tablet (7.5 mg total) by mouth daily. 04/25/19   [name], [name], PA-C  pantoprazole (PROTONIX) 40 MG tablet Take 40 mg by mouth 2 (two) times daily as needed (for reflux symptoms).     [provider]  PHENobarbital (LUMINAL) 64.8 MG tablet Take 1 tablet (64.8 mg total) by mouth 2 (two) times daily. 05/27/20   [name], NP  pramipexole (MIRAPEX) 0.25 MG tablet TAKE 1 TABLET BY MOUTH TWICE DAILY AND 2 TABLETS AT BEDTIME 05/27/20   [name], NP  promethazine (PHENERGAN) 25 MG tablet Take  25 mg by mouth every 6 (six) hours as needed for nausea or vomiting.    [provider]  QUEtiapine (SEROQUEL) 100 MG tablet TAKE 1 & 1/2 (ONE & ONE-HALF) TABLETS BY MOUTH AT BEDTIME 11/23/19   [name], NP    Family History Family History  Problem Relation Age of Onset  . Heart attack Father   . Breast cancer Sister     Social History Social History   Tobacco Use  . Smoking status: Former Research scientist (life sciences)  .  Smokeless tobacco: Never Used  . Tobacco comment: Quit 10 years ago.  Vaping Use  . Vaping Use: Never used  Substance Use Topics  . Alcohol use: No    Alcohol/week: 0.0 standard drinks  . Drug use: No     Allergies   Requip [ropinirole hcl], Topamax [topiramate], and Zonegran [zonisamide]   Review of Systems Review of Systems  Constitutional: Negative for fever.  Respiratory: Negative for shortness of breath.   Cardiovascular: Negative for chest pain.  Gastrointestinal: Negative for abdominal pain, diarrhea, nausea and vomiting.  Genitourinary: Positive for dysuria and frequency. Negative for flank pain, genital sores, hematuria, menstrual problem, vaginal bleeding, vaginal discharge and vaginal pain.  Musculoskeletal: Positive for back pain and myalgias.  Skin: Negative for rash.  Neurological: Negative for dizziness, light-headedness and headaches.     Physical Exam Triage Vital Signs ED Triage Vitals  Enc Vitals Group     BP      Pulse      Resp      Temp      Temp src      SpO2      Weight      Height      Head Circumference      Peak Flow      Pain Score      Pain Loc      Pain Edu?      Excl. in [place]?    No data found.  Updated Vital Signs BP 133/90 (BP Location: Left Arm)   Pulse 89   Temp 98.9 F (37.2 C) (Oral)   Resp 16   LMP 05/11/2013 (LMP Unknown)   SpO2 94%   Visual Acuity Right Eye Distance:   Left Eye Distance:   Bilateral Distance:    Right Eye Near:   Left Eye Near:    Bilateral Near:     Physical Exam Vitals  and nursing note reviewed.  Constitutional:      Appearance: She is well-developed.     Comments: No acute distress  HENT:     Head: Normocephalic and atraumatic.     Nose: Nose normal.  Eyes:     Conjunctiva/sclera: Conjunctivae normal.  Cardiovascular:     Rate and Rhythm: Normal rate.  Pulmonary:     Effort: Pulmonary effort is normal. No respiratory distress.  Abdominal:     General: There is no distension.     Comments: Soft, nondistended, nontender to light and deep palpation throughout abdomen  Musculoskeletal:        General: Normal range of motion.     Cervical back: Neck supple.     Comments: Mild tenderness to palpation to right lower back No CVA tenderness  Ambulates independently to bathroom  Skin:    General: Skin is warm and dry.  Neurological:     Mental Status: She is alert and oriented to person, place, and time.      UC Treatments / Results  Labs (all labs ordered are listed, but only abnormal results are displayed) Labs Reviewed  POCT URINALYSIS DIP (MANUAL ENTRY) - Abnormal; Notable for the following components:      Result Value   Clarity, UA cloudy (*)    Blood, UA moderate (*)    Leukocytes, UA Trace (*)    All other components within normal limits  URINE CULTURE    EKG   Radiology No results found.  Procedures Procedures (including critical care time)  Medications Ordered in UC Medications - No  data to display  Initial Impression / Assessment and Plan / UC Course  I have reviewed the triage vital signs and the nursing notes.  Pertinent labs & imaging results that were available during my care of the patient were reviewed by me and considered in my medical decision making (see chart for details).     Trace leuks, moderate hemoglobin, history of prior UTIs, initiate on Keflex twice daily x1 week, Diflucan to cover for yeast.  Urine culture pending for confirmation as well as to check sensitivities.  Will alter therapy as  needed.  Discussed strict return precautions. Patient verbalized understanding and is agreeable with plan.  Final Clinical Impressions(s) / UC Diagnoses   Final diagnoses:  Lower urinary tract infection, acute     Discharge Instructions     Begin Keflex twice daily for 1 week to treat UTI Drink plenty of fluids 1 tab of Diflucan today, may repeat after completing Keflex to treat underlying yeast infection to help with itching Follow-up if not improving or worsening    ED Prescriptions    Medication Sig Dispense Auth. Provider   cephALEXin (KEFLEX) 500 MG capsule Take 1 capsule (500 mg total) by mouth 2 (two) times daily for 7 days. 14 capsule Minor Iden C, PA-C   fluconazole (DIFLUCAN) 150 MG tablet Take 1 tablet (150 mg total) by mouth once for 1 dose. 2 tablet Karma Hiney, Carter C, PA-C     PDMP not reviewed this encounter.   Joneen Caraway [place]ail C, PA-C 07/25/20 1745

## 2020-07-25 NOTE — Discharge Instructions (Signed)
Begin Keflex twice daily for 1 week to treat UTI Drink plenty of fluids 1 tab of Diflucan today, may repeat after completing Keflex to treat underlying yeast infection to help with itching Follow-up if not improving or worsening

## 2020-07-25 NOTE — ED Triage Notes (Signed)
Burning during urination. Itching generally. Urinary frequency for about a week. PT is aox4 and ambulatory.

## 2020-08-02 ENCOUNTER — Telehealth (HOSPITAL_COMMUNITY): Payer: Self-pay | Admitting: Emergency Medicine

## 2020-08-02 ENCOUNTER — Ambulatory Visit
Admission: EM | Admit: 2020-08-02 | Discharge: 2020-08-02 | Disposition: A | Discharge status: Home / Self Care | Payer: Medicare Other | Attending: Physician Assistant | Admitting: Physician Assistant

## 2020-08-02 ENCOUNTER — Other Ambulatory Visit: Payer: Self-pay

## 2020-08-02 DIAGNOSIS — N3090 Cystitis, unspecified without hematuria: Secondary | ICD-10-CM | POA: Insufficient documentation

## 2020-08-02 LAB — POCT URINALYSIS DIP (MANUAL ENTRY)
Bilirubin, UA: NEGATIVE
Glucose, UA: NEGATIVE mg/dL
Ketones, POC UA: NEGATIVE mg/dL
Nitrite, UA: NEGATIVE
Protein Ur, POC: NEGATIVE mg/dL
Spec Grav, UA: 1.02 (ref 1.010–1.025)
Urobilinogen, UA: 0.2 E.U./dL
pH, UA: 7.5 (ref 5.0–8.0)

## 2020-08-02 MED ORDER — NITROFURANTOIN MONOHYD MACRO 100 MG PO CAPS: 100.0000 mg | ORAL_CAPSULE | Freq: Two times a day (BID) | ORAL | 0 refills | Status: DC

## 2020-08-02 NOTE — ED Triage Notes (Signed)
Pt states tx'd for UTI last week and completed her antibiotics yesterday and sx's started by today. C/o urinary urgency with a slight burn.

## 2020-08-02 NOTE — Discharge Instructions (Signed)
No urine culture results from last week, a new one has been sent. This will tell us if any residual bacterial is in your urine. Start macrobid to cover for other possible bacteria. Keep hydrated, urine should be clear to pale yellow in color. Monitor for any worsening of symptoms, fever, worsening abdominal pain, nausea/vomiting, flank pain, follow up for reevaluation.

## 2020-08-02 NOTE — ED Provider Notes (Signed)
[name]    CSN: [id number] Arrival date & time: 08/02/20  1633      History   Chief Complaint Chief Complaint  Patient presents with  . Urinary Tract Infection    HPI Teresa Lawrence is a 54 y.o. female.   54 year old female returns to clinic for reoccurring urinary symptoms.  She was seen 07/25/2020, was put on Keflex for UTI.  States while on Keflex, had good relief of symptoms.  However, finished antibiotics yesterday, and started having urinary urgency, dysuria today.  Denies abdominal pain, nausea, vomiting.  Denies fevers.     Past Medical History:  Diagnosis Date  . Abnormal Pap smear   . Anxiety   . Bipolar disorder ([place])   . Dysmenorrhea   . Dysplasia   . Dysrhythmia    episodes of palpitations  . Dysuria   . H/O head injury   . Headache(784.0)   . High blood cholesterol   . HSV-2 infection   . Hx of hypercholesterolemia   . Hx of ovarian cyst   . Hx of seizure disorder   . Mass of ovary    left side  . Menorrhagia   . Mental retardation    mild per Dr. notes in [facility]  . Mild mental retardation   . Perimenopausal vasomotor symptoms   . PMS (premenstrual syndrome)   . PMS (premenstrual syndrome)   . PMS (premenstrual syndrome)   . RLS (restless legs syndrome)   . Seizures ([place])   . Uterine mass   . Vaginal yeast infection 2007   recurrent   . Yeast vaginitis     Patient Active Problem List   Diagnosis Date Noted  . RLS (restless legs syndrome)   . Acute cystitis with hematuria 05/09/2020  . Nondisplaced fracture of lateral malleolus of left fibula, initial encounter for closed fracture 10/25/2017  . Insomnia 03/17/2013  . Headache 02/23/2013  . Hx of yeast infection 04/20/2012  . Hx of menorrhagia 04/20/2012  . 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 Bipolar disorder ([place]) 04/20/2012  . Hx of menorrhagia 04/20/2012  . Hx of ovarian cyst 04/20/2012  . Epilepsy ([place]) 04/20/2012  . Hx of seizure disorder  04/20/2012  . 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 Hx of dysmenorrhea 04/20/2012  . 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 Hx of hypercholesterolemia 04/20/2012  . Hx of PMS (premenstrual syndrome) 04/20/2012  . Encounter for therapeutic drug monitoring 05/01/2011    Past Surgical History:  Procedure Laterality Date  . ABDOMINAL HYSTERECTOMY  2013  . COLPOSCOPY    . CYSTOSCOPY Bilateral 05/30/2013   Procedure: CYSTOSCOPY;  Surgeon: Betsy Coder, MD;  Location: [facility];  Service: Gynecology;  Laterality: Bilateral;  . DILATION AND CURETTAGE OF UTERUS    . ENDOMETRIAL BIOPSY    . HYSTEROSCOPY W/ ENDOMETRIAL ABLATION    . LAPAROSCOPIC HYSTERECTOMY Right 05/30/2013   Procedure: HYSTERECTOMY TOTAL LAPAROSCOPIC;  Surgeon: Betsy Coder, MD;  Location: [facility];  Service: Gynecology;  Laterality: Right;  Right Salpingectomy  . POLYPECTOMY    . WISDOM TOOTH EXTRACTION      OB History   No obstetric history on file.      Home Medications    Prior to Admission medications   Medication Sig Start Date End Date Taking? Authorizing Provider  BELSOMRA 20 MG TABS TAKE 1 TABLET BY MOUTH AT BEDTIME AS NEEDED 06/10/20   [name], NP  acyclovir (ZOVIRAX) 200 MG capsule Take 200  mg by mouth 3 (three) times daily as needed (fever blisters).     [provider]  atorvastatin (LIPITOR) 20 MG tablet Take 1 tablet (20 mg total) by mouth daily. 08/04/17   [name], MD  Cariprazine HCl (VRAYLAR PO) Take by mouth.    [provider]  clonazePAM (KLONOPIN) 2 MG tablet TAKE 1 TABLET BY MOUTH ONCE DAILY IN THE MORNING AND TAKE 1 ONCE DAILY IN THE AFTERNOON AND TAKE  2 EVERYDAY AT BEDTIME 07/08/20   [name], NP  fluticasone (FLONASE) 50 MCG/ACT nasal spray Place 1 spray into both nostrils daily. 02/20/20   [name], [name], PA-C  gabapentin (NEURONTIN) 100 MG capsule TAKE 1 CAPSULE BY MOUTH TWO TIMES DAILY 05/27/20   [name], NP  hydrocortisone cream 1 % Apply 1  application topically as needed (itching).    [provider]  ibuprofen (ADVIL,MOTRIN) 600 MG tablet Take 1 tablet (600 mg total) by mouth every 6 (six) hours as needed (mild pain). 05/31/13   Earnstine Regal, PA-C  nitrofurantoin, macrocrystal-monohydrate, (MACROBID) 100 MG capsule Take 1 capsule (100 mg total) by mouth 2 (two) times daily. 08/02/20   [name], Shanae Luo V, PA-C  pantoprazole (PROTONIX) 40 MG tablet Take 40 mg by mouth 2 (two) times daily as needed (for reflux symptoms).     [provider]  PHENobarbital (LUMINAL) 64.8 MG tablet Take 1 tablet (64.8 mg total) by mouth 2 (two) times daily. 05/27/20   [name], NP  pramipexole (MIRAPEX) 0.25 MG tablet TAKE 1 TABLET BY MOUTH TWICE DAILY AND 2 TABLETS AT BEDTIME 05/27/20   [name], NP  promethazine (PHENERGAN) 25 MG tablet Take 25 mg by mouth every 6 (six) hours as needed for nausea or vomiting.    [provider]  QUEtiapine (SEROQUEL) 100 MG tablet TAKE 1 & 1/2 (ONE & ONE-HALF) TABLETS BY MOUTH AT BEDTIME 11/23/19   [name], NP    Family History Family History  Problem Relation Age of Onset  . Heart attack Father   . Breast cancer Sister     Social History Social History   Tobacco Use  . Smoking status: Former Research scientist (life sciences)  . Smokeless tobacco: Never Used  . Tobacco comment: Quit 10 years ago.  Vaping Use  . Vaping Use: Never used  Substance Use Topics  . Alcohol use: No    Alcohol/week: 0.0 standard drinks  . Drug use: No     Allergies   Requip [ropinirole hcl], Topamax [topiramate], and Zonegran [zonisamide]   Review of Systems Review of Systems  Reason unable to perform ROS: See HPI as above.     Physical Exam Triage Vital Signs ED Triage Vitals  Enc Vitals Group     BP 08/02/20 1711 125/86     Pulse Rate 08/02/20 1711 72     Resp 08/02/20 1711 18     Temp 08/02/20 1711 99 F (37.2 C)     Temp Source 08/02/20 1711 Oral     SpO2 08/02/20 1711 95 %     Weight --      Height  --      Head Circumference --      Peak Flow --      Pain Score 08/02/20 1721 0     Pain Loc --      Pain Edu? --      Excl. in [place]? --    No data found.  Updated Vital Signs BP 125/86 (  BP Location: Left Arm)   Pulse 72   Temp 99 F (37.2 C) (Oral)   Resp 18   LMP 05/11/2013 (LMP Unknown)   SpO2 95%   Physical Exam Constitutional:      General: She is not in acute distress.    Appearance: She is well-developed. She is not ill-appearing, toxic-appearing or diaphoretic.  HENT:     Head: Normocephalic and atraumatic.  Eyes:     Conjunctiva/sclera: Conjunctivae normal.     Pupils: Pupils are equal, round, and reactive to light.  Cardiovascular:     Rate and Rhythm: Normal rate and regular rhythm.  Pulmonary:     Effort: Pulmonary effort is normal. No respiratory distress.     Comments: LCTAB Abdominal:     General: Bowel sounds are normal.     Palpations: Abdomen is soft.     Tenderness: There is no abdominal tenderness. There is no right CVA tenderness, left CVA tenderness, guarding or rebound.  Musculoskeletal:     Cervical back: Normal range of motion and neck supple.  Skin:    General: Skin is warm and dry.  Neurological:     Mental Status: She is alert and oriented to person, place, and time.  Psychiatric:        Behavior: Behavior normal.        Judgment: Judgment normal.      UC Treatments / Results  Labs (all labs ordered are listed, but only abnormal results are displayed) Labs Reviewed  POCT URINALYSIS DIP (MANUAL ENTRY) - Abnormal; Notable for the following components:      Result Value   Blood, UA moderate (*)    Leukocytes, UA Trace (*)    All other components within normal limits  URINE CULTURE    EKG   Radiology No results found.  Procedures Procedures (including critical care time)  Medications Ordered in UC Medications - No data to display  Initial Impression / Assessment and Plan / UC Course  I have reviewed the triage vital  signs and the nursing notes.  Pertinent labs & imaging results that were available during my care of the patient were reviewed by me and considered in my medical decision making (see chart for details).    Appears urine culture from last visit was not sent.  Current dipstick still with trace leukocytes, moderate blood.  Will start Macrobid as directed.  Urine culture sent.  Push fluids.  Return precautions given.  Final Clinical Impressions(s) / UC Diagnoses   Final diagnoses:  Cystitis    ED Prescriptions    Medication Sig Dispense Auth. Provider   nitrofurantoin, macrocrystal-monohydrate, (MACROBID) 100 MG capsule  (Status: Discontinued) Take 1 capsule (100 mg total) by mouth 2 (two) times daily. 10 capsule Jaquarious Grey V, PA-C   nitrofurantoin, macrocrystal-monohydrate, (MACROBID) 100 MG capsule Take 1 capsule (100 mg total) by mouth 2 (two) times daily. 10 capsule Ok Edwards, PA-C     PDMP not reviewed this encounter.   Ok Edwards, PA-C 08/02/20 1808

## 2020-08-02 NOTE — Telephone Encounter (Signed)
Patient called stating she finished her antibiotics from her recent visit for UTI.  States symptoms were improving, but immediately began getting worse again after finishing the antibiotic.  This RN went to review culture and notes one was not collected.  Spoke to Villanueva, APP who encouraged patient to return to collect urine culture sample, no provider assessment.  Then we can send off something else or confirm her antibiotic was appropriate.  Patient verbalized understanding.

## 2020-08-06 LAB — URINE CULTURE: Culture: NO GROWTH

## 2020-10-13 ENCOUNTER — Other Ambulatory Visit: Payer: Self-pay | Admitting: Neurology

## 2020-10-14 ENCOUNTER — Ambulatory Visit: Payer: Medicare Other | Admitting: Medical

## 2020-10-14 NOTE — Telephone Encounter (Signed)
Patient is due for Belsomra refill. She is up to date on her appointments.  controlled registry was check and is appropriate

## 2020-10-19 ENCOUNTER — Other Ambulatory Visit: Payer: Self-pay | Admitting: Neurology

## 2020-11-09 ENCOUNTER — Other Ambulatory Visit: Payer: Self-pay | Admitting: Neurology

## 2020-11-17 ENCOUNTER — Other Ambulatory Visit: Payer: Self-pay | Admitting: Neurology

## 2020-11-18 ENCOUNTER — Other Ambulatory Visit: Payer: Self-pay | Admitting: Neurology

## 2020-11-18 ENCOUNTER — Other Ambulatory Visit: Payer: Self-pay | Admitting: Emergency Medicine

## 2020-11-18 MED ORDER — PHENOBARBITAL 64.8 MG PO TABS
64.8000 mg | ORAL_TABLET | Freq: Two times a day (BID) | ORAL | 1 refills | Status: DC
Start: 2020-11-18 — End: 2021-03-01

## 2020-11-18 MED ORDER — PHENOBARBITAL 64.8 MG PO TABS: 64.8000 mg | ORAL_TABLET | Freq: Two times a day (BID) | ORAL | 1 refills | Status: DC

## 2020-11-18 NOTE — Addendum Note (Signed)
Addended by: Andre Lefort on: 11/18/2020 02:55 PM   Modules accepted: Orders

## 2020-11-18 NOTE — Telephone Encounter (Signed)
 Drug registry Verified LR:08/25/2020 Qty: 180 for 90 days Last OV:05-27-20 Pending appointment: 11-21-20

## 2020-11-18 NOTE — Telephone Encounter (Signed)
PHENobarbital (LUMINAL) 64.8 MG tablet to [id number]

## 2020-11-21 ENCOUNTER — Encounter: Payer: Self-pay | Admitting: Neurology

## 2020-11-21 ENCOUNTER — Other Ambulatory Visit: Payer: Self-pay

## 2020-11-21 ENCOUNTER — Ambulatory Visit (INDEPENDENT_AMBULATORY_CARE_PROVIDER_SITE_OTHER): Payer: Medicare Other | Admitting: Neurology

## 2020-11-21 VITALS — BP 120/78 | HR 79 | Ht 63.0 in | Wt 245.0 lb

## 2020-11-21 DIAGNOSIS — Z5181 Encounter for therapeutic drug level monitoring: Secondary | ICD-10-CM

## 2020-11-21 DIAGNOSIS — Z8669 Personal history of other diseases of the nervous system and sense organs: Secondary | ICD-10-CM

## 2020-11-21 DIAGNOSIS — E538 Deficiency of other specified B group vitamins: Secondary | ICD-10-CM | POA: Diagnosis not present

## 2020-11-21 DIAGNOSIS — R269 Unspecified abnormalities of gait and mobility: Secondary | ICD-10-CM | POA: Diagnosis not present

## 2020-11-21 DIAGNOSIS — G40909 Epilepsy, unspecified, not intractable, without status epilepticus: Secondary | ICD-10-CM

## 2020-11-21 DIAGNOSIS — G2581 Restless legs syndrome: Secondary | ICD-10-CM

## 2020-11-21 DIAGNOSIS — R413 Other amnesia: Secondary | ICD-10-CM

## 2020-11-21 NOTE — Progress Notes (Signed)
Reason for visit: Seizures, headache, bipolar disorder, gait disorder  Teresa Lawrence is an 55 y.o. female  History of present illness:  Teresa Lawrence is a 55 year old right-handed white female with a history of seizures that have been relatively well controlled.  Her last seizure was in 2019.  The patient is on phenobarbital and she takes multiple medications for anxiety and bipolar disorder.  She is now followed through psychiatry.  She is on Vraylar which seems to controlled her bipolar symptoms significantly.  She has had some problems with gait instability, she reports no falls.  Her ability to walk has slowed down.  She reports no tremors.  She takes relatively high dose clonazepam taking 2 mg twice during the day and 4 mg in the evening.  The patient is on Mirapex for restless leg syndrome.  She claims that she usually is sleeping fairly well at this point, her headaches are occasional and not a significant problem for her.  She may have some dizziness when she stands up quickly.  She does not have dizziness with sitting or lying down.  She has not had any syncopal episodes.  Past Medical History:  Diagnosis Date  . Abnormal Pap smear   . Anxiety   . Bipolar disorder ([place])   . Dysmenorrhea   . Dysplasia   . Dysrhythmia    episodes of palpitations  . Dysuria   . H/O head injury   . Headache(784.0)   . High blood cholesterol   . HSV-2 infection   . Hx of hypercholesterolemia   . Hx of ovarian cyst   . Hx of seizure disorder   . Mass of ovary    left side  . Menorrhagia   . Mental retardation    mild per Dr. notes in [facility]  . Mild mental retardation   . Perimenopausal vasomotor symptoms   . PMS (premenstrual syndrome)   . PMS (premenstrual syndrome)   . PMS (premenstrual syndrome)   . RLS (restless legs syndrome)   . Seizures ([place])   . Uterine mass   . Vaginal yeast infection 2007   recurrent   . Yeast vaginitis     Past Surgical History:  Procedure Laterality Date   . ABDOMINAL HYSTERECTOMY  2013  . COLPOSCOPY    . CYSTOSCOPY Bilateral 05/30/2013   Procedure: CYSTOSCOPY;  Surgeon: Betsy Coder, MD;  Location: [facility];  Service: Gynecology;  Laterality: Bilateral;  . DILATION AND CURETTAGE OF UTERUS    . ENDOMETRIAL BIOPSY    . HYSTEROSCOPY W/ ENDOMETRIAL ABLATION    . LAPAROSCOPIC HYSTERECTOMY Right 05/30/2013   Procedure: HYSTERECTOMY TOTAL LAPAROSCOPIC;  Surgeon: Betsy Coder, MD;  Location: [facility];  Service: Gynecology;  Laterality: Right;  Right Salpingectomy  . POLYPECTOMY    . WISDOM TOOTH EXTRACTION      Family History  Problem Relation Age of Onset  . Heart attack Father   . Breast cancer Sister     Social history:  reports that she has quit smoking. She has never used smokeless tobacco. She reports that she does not drink alcohol and does not use drugs.    Allergies  Allergen Reactions  . Requip [Ropinirole Hcl] Nausea And Vomiting  . Topamax [Topiramate] Nausea Only  . Zonegran [Zonisamide] Nausea Only    Medications:  Prior to Admission medications   Medication Sig Start Date End Date Taking? Authorizing Provider  acyclovir (ZOVIRAX) 200 MG capsule Take 200 mg by mouth 3 (three) times  daily as needed (fever blisters).    Yes [provider]  atorvastatin (LIPITOR) 20 MG tablet Take 1 tablet (20 mg total) by mouth daily. 08/04/17  Yes [name], MD  BELSOMRA 20 MG TABS TAKE 1 TABLET BY MOUTH AT BEDTIME AS NEEDED(PENDING APPT 11/21/2020) 11/12/20  Yes [name], NP  Cariprazine HCl (VRAYLAR PO) Take by mouth.   Yes [provider]  clonazePAM (KLONOPIN) 2 MG tablet TAKE 1 TABLET BY MOUTH ONCE DAILY IN THE MORNING AND TAKE 1 ONCE DAILY IN THE AFTERNOON AND TAKE  2 EVERYDAY AT BEDTIME 07/08/20  Yes [name], NP  fluticasone (FLONASE) 50 MCG/ACT nasal spray Place 1 spray into both nostrils daily. 02/20/20  Yes [name], [name], PA-C  gabapentin (NEURONTIN) 100 MG capsule TAKE 1 CAPSULE BY MOUTH  TWO TIMES DAILY 05/27/20  Yes [name], NP  hydrocortisone cream 1 % Apply 1 application topically as needed (itching).   Yes [provider]  ibuprofen (ADVIL,MOTRIN) 600 MG tablet Take 1 tablet (600 mg total) by mouth every 6 (six) hours as needed (mild pain). 05/31/13  Yes Powell, [place], PA-C  nitrofurantoin, macrocrystal-monohydrate, (MACROBID) 100 MG capsule Take 1 capsule (100 mg total) by mouth 2 (two) times daily. 08/02/20  Yes Yu, [name], PA-C  pantoprazole (PROTONIX) 40 MG tablet Take 40 mg by mouth 2 (two) times daily as needed (for reflux symptoms).    Yes [provider]  PHENobarbital (LUMINAL) 64.8 MG tablet Take 1 tablet (64.8 mg total) by mouth 2 (two) times daily. 11/18/20  Yes [name], MD  pramipexole (MIRAPEX) 0.25 MG tablet TAKE 1 TABLET BY MOUTH TWICE DAILY AND 2 TABLETS AT BEDTIME 05/27/20  Yes [name], NP  promethazine (PHENERGAN) 25 MG tablet Take 25 mg by mouth every 6 (six) hours as needed for nausea or vomiting.   Yes [provider]  QUEtiapine (SEROQUEL) 100 MG tablet TAKE 1 & 1/2 (ONE & ONE-HALF) TABLETS BY MOUTH AT BEDTIME 11/23/19  Yes [name], NP  VRAYLAR 4.5 MG CAPS Take 1 capsule by mouth at bedtime. 11/09/20   [provider]    ROS:  Out of a complete 14 system review of symptoms, the patient complains only of the following symptoms, and all other reviewed systems are negative.  Walking difficulty Memory problems Weight gain  Blood pressure 120/78, pulse 79, height 5\' 3"  (1.6 m), weight 245 lb (111.1 kg), last menstrual period 05/11/2013, SpO2 98 %.  Physical Exam  General: The patient is alert and cooperative at the time of the examination.  The patient is markedly obese.  Skin: 2-3+ edema below the knees bilaterally is seen.   Neurologic Exam  Mental status: The patient is alert and oriented x 3 at the time of the examination. The Mini-Mental status examination done today shows a total  score 22/30.   Cranial nerves: Facial symmetry is present. Speech is normal, no aphasia or dysarthria is noted. Extraocular movements are full. Visual fields are full.  Some mild masking of the face is seen.  Motor: The patient has good strength in all 4 extremities.  Sensory examination: Soft touch sensation is symmetric on the face, arms, and legs.  Coordination: The patient has good finger-nose-finger and heel-to-shin bilaterally.  Gait and station: The patient has the ability to walk independently, there is some decrease in arm swing seen. Tandem gait is slightly unsteady. Romberg is negative. No drift is seen.  Reflexes: Deep tendon  reflexes are symmetric.   Assessment/Plan:  1.  Seizure disorder  2.  History of migraine headache  3.  Bipolar disorder  4.  Restless leg syndrome  5.  Mild gait disorder  6.  Reported memory disturbance  The patient will be sent for blood work today.  I have suggested physical therapy for the walking, she does not wish to pursue this.  She currently does not operate a motor vehicle.  The patient is on Vraylar which may result in some parkinsonian features, the patient may have some mild issues in this regard.  The use of phenobarbital and high-dose clonazepam may be also affecting her balance.  She is also on gabapentin.  If the gait problem significantly worsens in the future, repeat CT of the head or MRI may be indicated.  She will follow up in 6 months.  Jill Alexanders MD 11/21/2020 11:32 AM  [facility] [address] [place], [place] [address]  Phone [phone] Fax [phone]

## 2020-11-23 LAB — SEDIMENTATION RATE: Sed Rate: 21 mm/hr (ref 0–40)

## 2020-11-23 LAB — COMPREHENSIVE METABOLIC PANEL
ALT: 19 IU/L (ref 0–32)
AST: 15 IU/L (ref 0–40)
Albumin/Globulin Ratio: 1.1 — ABNORMAL LOW (ref 1.2–2.2)
Albumin: 4 g/dL (ref 3.8–4.9)
Alkaline Phosphatase: 157 IU/L — ABNORMAL HIGH (ref 44–121)
BUN/Creatinine Ratio: 15 (ref 9–23)
BUN: 10 mg/dL (ref 6–24)
Bilirubin Total: 0.3 mg/dL (ref 0.0–1.2)
CO2: 26 mmol/L (ref 20–29)
Calcium: 9.1 mg/dL (ref 8.7–10.2)
Chloride: 104 mmol/L (ref 96–106)
Creatinine, Ser: 0.68 mg/dL (ref 0.57–1.00)
GFR calc Af Amer: 115 mL/min/{1.73_m2} (ref >59)
GFR calc non Af Amer: 99 mL/min/{1.73_m2} (ref >59)
Globulin, Total: 3.6 g/dL (ref 1.5–4.5)
Glucose: 89 mg/dL (ref 65–99)
Potassium: 4.3 mmol/L (ref 3.5–5.2)
Sodium: 145 mmol/L — ABNORMAL HIGH (ref 134–144)
Total Protein: 7.6 g/dL (ref 6.0–8.5)

## 2020-11-23 LAB — VITAMIN B12: Vitamin B-12: 405 pg/mL (ref 232–1245)

## 2020-11-23 LAB — PHENOBARBITAL LEVEL: Phenobarbital, Serum: 23 ug/mL (ref 15–40)

## 2020-11-23 LAB — TSH: TSH: 2.47 u[IU]/mL (ref 0.450–4.500)

## 2020-11-23 LAB — COPPER, SERUM: Copper: 174 ug/dL — ABNORMAL HIGH (ref 80–158)

## 2020-11-23 LAB — RPR: RPR Ser Ql: NONREACTIVE

## 2020-11-24 ENCOUNTER — Other Ambulatory Visit: Payer: Self-pay | Admitting: Neurology

## 2020-11-25 NOTE — Telephone Encounter (Signed)
Last seen 11-21-2020 by Dr. Jannifer Franklin.

## 2020-11-26 ENCOUNTER — Telehealth: Payer: Self-pay | Admitting: Emergency Medicine

## 2020-11-26 NOTE — Telephone Encounter (Signed)
-----   Message from [name], MD sent at 11/23/2020  5:34 PM EST ----- Blood work reveals an elevated copper level, we need to follow over time, no evidence of significant liver disease on chemistry test, mild stable elevation of alkaline phosphatase. The phenobarbital level was therapeutic, no change in medical therapy. There is a minimal elevation of the sodium level, not clinically significant. Please call the patient. ----- Message ----- From: Lavone Neri Lab Results In Sent: 11/22/2020   7:38 AM EST To: [name], MD

## 2020-11-26 NOTE — Telephone Encounter (Signed)
Called patient and discussed Dr. Tobey Grim findings regarding blood work.  Patient had questions regarding copper level and I spoke to her mom also.  Explained that may be normal for her as it was just outside of what is considered 'normal'.  Reassured that Dr. Jannifer Franklin was not concerned with that and she should continue to take all her medications as prescribed.  Patient and her both both denied further questions, verbalized understanding and expressed appreciation for the phone call.

## 2020-12-09 ENCOUNTER — Other Ambulatory Visit: Payer: Self-pay | Admitting: Neurology

## 2020-12-11 ENCOUNTER — Other Ambulatory Visit: Payer: Self-pay | Admitting: Neurology

## 2020-12-27 ENCOUNTER — Ambulatory Visit (INDEPENDENT_AMBULATORY_CARE_PROVIDER_SITE_OTHER): Payer: Medicare Other | Admitting: Medical

## 2020-12-27 ENCOUNTER — Ambulatory Visit (HOSPITAL_BASED_OUTPATIENT_CLINIC_OR_DEPARTMENT_OTHER)
Admission: RE | Admit: 2020-12-27 | Discharge: 2020-12-27 | Disposition: A | Discharge status: Home / Self Care | Payer: Medicare Other | Source: Ambulatory Visit | Attending: Medical | Admitting: Medical

## 2020-12-27 ENCOUNTER — Other Ambulatory Visit: Payer: Self-pay

## 2020-12-27 VITALS — BP 119/69 | HR 76 | Temp 98.3°F | Ht 63.0 in | Wt 243.0 lb

## 2020-12-27 DIAGNOSIS — Z8669 Personal history of other diseases of the nervous system and sense organs: Secondary | ICD-10-CM | POA: Diagnosis not present

## 2020-12-27 DIAGNOSIS — E785 Hyperlipidemia, unspecified: Secondary | ICD-10-CM | POA: Diagnosis not present

## 2020-12-27 DIAGNOSIS — G2581 Restless legs syndrome: Secondary | ICD-10-CM | POA: Diagnosis not present

## 2020-12-27 DIAGNOSIS — R600 Localized edema: Secondary | ICD-10-CM | POA: Insufficient documentation

## 2020-12-27 DIAGNOSIS — F319 Bipolar disorder, unspecified: Secondary | ICD-10-CM | POA: Diagnosis not present

## 2020-12-27 LAB — COMPREHENSIVE METABOLIC PANEL
ALT: 14 U/L (ref 0–35)
AST: 14 U/L (ref 0–37)
Albumin: 4 g/dL (ref 3.5–5.2)
Alkaline Phosphatase: 128 U/L — ABNORMAL HIGH (ref 39–117)
BUN: 11 mg/dL (ref 6–23)
CO2: 31 mEq/L (ref 19–32)
Calcium: 9.4 mg/dL (ref 8.4–10.5)
Chloride: 103 mEq/L (ref 96–112)
Creatinine, Ser: 0.74 mg/dL (ref 0.40–1.20)
GFR: 91.86 mL/min (ref >60.00)
Glucose, Bld: 85 mg/dL (ref 70–99)
Potassium: 4.4 mEq/L (ref 3.5–5.1)
Sodium: 142 mEq/L (ref 135–145)
Total Bilirubin: 0.4 mg/dL (ref 0.2–1.2)
Total Protein: 7.6 g/dL (ref 6.0–8.3)

## 2020-12-27 LAB — LIPID PANEL
Cholesterol: 307 mg/dL — ABNORMAL HIGH (ref 0–200)
HDL: 44.2 mg/dL (ref >39.00)
LDL Cholesterol: 247 mg/dL — ABNORMAL HIGH (ref 0–99)
NonHDL: 262.97
Total CHOL/HDL Ratio: 7
Triglycerides: 80 mg/dL (ref 0.0–149.0)
VLDL: 16 mg/dL (ref 0.0–40.0)

## 2020-12-27 LAB — BRAIN NATRIURETIC PEPTIDE: Pro B Natriuretic peptide (BNP): 15 pg/mL (ref 0.0–100.0)

## 2020-12-27 NOTE — Progress Notes (Signed)
Subjective:    Patient ID: Teresa Lawrence, female    DOB: 22-Jan-1966, 55 y.o.   MRN: [id number]  HPI   Pt in for first time.  Pt is on clonazepam. Pt sees Dr Krista Blue and [facility]. History of seizure disorder, bipolar and history of ha.  Last summary from neurologist.  1.  Headache 2.  Seizure disorder 3.  Bipolar disorder 4.  RLS  Overall, her conditions have remained stable.  She will remain on her current medications.  I will check routine blood work today.  I will send a refill of phenobarbital, Mirapex, and gabapentin.  She will follow-up in 6 months or sooner if needed, will have her check in with Dr. Jannifer Franklin at next visit, for evaluation of long term medications. Her dose of Seroquel has been reduced of recent.   Hx of intermittent gerd. She has protonix available. Only takes protonix 1 tab about twice a month.    Pt has restless leg and is on mirapex.   Hx of high cholesterol and on atrovastatin 20 mg daily.   Pt has bipolar. Pt psychiatrist took her off seroquel. Pt is now on vraylar.    Pt mom states for 2 years she had swollen legs both sides. No cause identified per mom. Works up in ED couple of times were negative over the years.           Review of Systems  Constitutional: Negative for chills, fatigue and fever.  Respiratory: Negative for cough, chest tightness, shortness of breath and wheezing.   Cardiovascular: Negative for chest pain and palpitations.  Gastrointestinal: Negative for abdominal pain, blood in stool, diarrhea, nausea and vomiting.  Genitourinary: Negative for difficulty urinating, dysuria, flank pain and frequency.  Musculoskeletal: Negative for back pain and joint swelling.  Skin: Negative for rash.  Neurological: Negative for dizziness, speech difficulty, weakness, numbness and headaches.  Hematological: Negative for adenopathy. Does not bruise/bleed easily.  Psychiatric/Behavioral: Negative for behavioral problems, decreased  concentration and hallucinations. The patient is not nervous/anxious.     Past Medical History:  Diagnosis Date  . Abnormal Pap smear   . Anxiety   . Bipolar disorder ([place])   . Dysmenorrhea   . Dysplasia   . Dysrhythmia    episodes of palpitations  . Dysuria   . H/O head injury   . Headache(784.0)   . High blood cholesterol   . HSV-2 infection   . Hx of hypercholesterolemia   . Hx of ovarian cyst   . Hx of seizure disorder   . Mass of ovary    left side  . Menorrhagia   . Mental retardation    mild per Dr. notes in [facility]  . Mild mental retardation   . Perimenopausal vasomotor symptoms   . PMS (premenstrual syndrome)   . PMS (premenstrual syndrome)   . PMS (premenstrual syndrome)   . RLS (restless legs syndrome)   . Seizures ([place])   . Uterine mass   . Vaginal yeast infection 2007   recurrent   . Yeast vaginitis      Social History   Socioeconomic History  . Marital status: Single    Spouse name: Not on file  . Number of children: 0  . Years of education: HS  . Highest education level: Not on file  Occupational History  . Occupation: Unemployed  Tobacco Use  . Smoking status: Former Research scientist (life sciences)  . Smokeless tobacco: Never Used  . Tobacco comment: Quit 10 years ago.  Vaping Use  . Vaping Use: Never used  Substance and Sexual Activity  . Alcohol use: No    Alcohol/week: 0.0 standard drinks  . Drug use: No  . Sexual activity: Not Currently  Other Topics Concern  . Not on file  Social History Narrative   Patient lives with her mom and two cats.   Disabled   Education high school.   Right handed.      Social Determinants of Health   Financial Resource Strain: Not on file  Food Insecurity: Not on file  Transportation Needs: Not on file  Physical Activity: Not on file  Stress: Not on file  Social Connections: Not on file  Intimate Partner Violence: Not on file    Past Surgical History:  Procedure Laterality Date  . ABDOMINAL HYSTERECTOMY  2013  .  COLPOSCOPY    . CYSTOSCOPY Bilateral 05/30/2013   Procedure: CYSTOSCOPY;  Surgeon: Betsy Coder, MD;  Location: [facility];  Service: Gynecology;  Laterality: Bilateral;  . DILATION AND CURETTAGE OF UTERUS    . ENDOMETRIAL BIOPSY    . HYSTEROSCOPY W/ ENDOMETRIAL ABLATION    . LAPAROSCOPIC HYSTERECTOMY Right 05/30/2013   Procedure: HYSTERECTOMY TOTAL LAPAROSCOPIC;  Surgeon: Betsy Coder, MD;  Location: [facility];  Service: Gynecology;  Laterality: Right;  Right Salpingectomy  . POLYPECTOMY    . WISDOM TOOTH EXTRACTION      Family History  Problem Relation Age of Onset  . Heart attack Father   . Breast cancer Sister     Allergies  Allergen Reactions  . Requip [Ropinirole Hcl] Nausea And Vomiting  . Topamax [Topiramate] Nausea Only  . Zonegran [Zonisamide] Nausea Only    Current Outpatient Medications on File Prior to Visit  Medication Sig Dispense Refill  . acyclovir (ZOVIRAX) 200 MG capsule Take 200 mg by mouth 3 (three) times daily as needed (fever blisters).     Marland Kitchen atorvastatin (LIPITOR) 20 MG tablet Take 1 tablet (20 mg total) by mouth daily.    . Cariprazine HCl (VRAYLAR PO) Take by mouth.    . clonazePAM (KLONOPIN) 2 MG tablet TAKE 1 TABLET BY MOUTH ONCE DAILY IN THE MORNING AND 1 ONCE DAILY AT NOON AND 2 EVERY DAY AT BEDTIME 120 tablet 0  . fluticasone (FLONASE) 50 MCG/ACT nasal spray Place 1 spray into both nostrils daily. 16 g 0  . gabapentin (NEURONTIN) 100 MG capsule TAKE 1 CAPSULE BY MOUTH TWO TIMES DAILY 180 capsule 1  . hydrocortisone cream 1 % Apply 1 application topically as needed (itching).    Marland Kitchen ibuprofen (ADVIL,MOTRIN) 600 MG tablet Take 1 tablet (600 mg total) by mouth every 6 (six) hours as needed (mild pain). 30 tablet 1  . nitrofurantoin, macrocrystal-monohydrate, (MACROBID) 100 MG capsule Take 1 capsule (100 mg total) by mouth 2 (two) times daily. 10 capsule 0  . pantoprazole (PROTONIX) 40 MG tablet Take 40 mg by mouth 2 (two) times daily as needed (for reflux  symptoms).     Marland Kitchen PHENobarbital (LUMINAL) 64.8 MG tablet Take 1 tablet (64.8 mg total) by mouth 2 (two) times daily. 180 tablet 1  . pramipexole (MIRAPEX) 0.25 MG tablet TAKE 1 TABLET BY MOUTH TWICE DAILY AND 2 TABLETS AT BEDTIME 360 tablet 1  . promethazine (PHENERGAN) 25 MG tablet Take 25 mg by mouth every 6 (six) hours as needed for nausea or vomiting.    . Suvorexant (BELSOMRA) 20 MG TABS Take 1 tablet by mouth at bedtime as needed. 30 tablet 0  .  VRAYLAR 4.5 MG CAPS Take 1 capsule by mouth at bedtime.    Marland Kitchen QUEtiapine (SEROQUEL) 25 MG tablet Take 25 mg by mouth at bedtime. (Patient not taking: Reported on 12/27/2020)     No current facility-administered medications on file prior to visit.    BP 119/69   Pulse 76   Temp 98.3 F (36.8 C) (Oral)   Ht 5\' 3"  (1.6 m)   Wt 243 lb (110.2 kg)   LMP 05/11/2013 (LMP Unknown)   SpO2 97%   BMI 43.05 kg/m       Objective:   Physical Exam  General Mental Status- Alert. General Appearance- Not in acute distress.   Skin General: Color- Normal Color. Moisture- Normal Moisture.  Neck Carotid Arteries- Normal color. Moisture- Normal Moisture. No carotid bruits. No JVD.  Chest and Lung Exam Auscultation: Breath Sounds:-Normal.  Cardiovascular Auscultation:Rythm- Regular. Murmurs & Other Heart Sounds:Auscultation of the heart reveals- No Murmurs.  Abdomen Inspection:-Inspeection Normal. Palpation/Percussion:Note:No mass. Palpation and Percussion of the abdomen reveal- Non Tender, Non Distended + BS, no rebound or guarding.    Neurologic Cranial Nerve exam:- CN III-XII intact(No nystagmus), symmetric smile. Strength:- 5/5 equal and symmetric strength both upper and lower extremities.  Lower extremity-patient has large have with some swelling from the calf region on the way down to her feet.  About 2+ edema present.  Negative Homans' sign.  On palpation no obvious popliteal bulge.  No warmth or redness to skin.       Assessment &  Plan:  History of bipolar.  Recently Seroquel was stopped and Vraylar was prescribed.  Hopefully this will work better and have less side effects.  Continue to follow-up with psychiatrist.  History of seizures.  Continue phenobarbital and clonazepam as prescribed by neurologist.  History of restless leg syndrome.  Continue Mirapex.  History of insomnia.  Continue on Belsomra prescribed by specialist.  Intermittent rare GERD type symptoms.  Can continue to use Protonix as needed.  Note also famotidine over-the-counter as an option if it is very rare periodic symptoms.  History of hyperlipidemia.  Will get lipid panel and metabolic panel today.  Bilateral chronic pedal edema.  Decided to go ahead and get bilateral lower extremity ultrasounds, BNP and chest x-ray.  Evaluate differential diagnosis.  Suspect that she probably have chronic dependent edema and the degree could also indicate some lymphedema.  Will evaluate studies and consider referring to vascular surgeon.  Follow-up in 1 month or as needed.

## 2020-12-27 NOTE — Patient Instructions (Addendum)
History of bipolar.  Recently Seroquel was stopped and Vraylar was prescribed.  Hopefully this will work better and have less side effects.  Continue to follow-up with psychiatrist.  History of seizures.  Continue phenobarbital and clonazepam as prescribed by neurologist.  History of restless leg syndrome.  Continue Mirapex.  History of insomnia.  Continue on Belsomra prescribed by specialist.  Intermittent rare GERD type symptoms.  Can continue to use Protonix as needed.  Note also famotidine over-the-counter as an option if it is very rare periodic symptoms.  History of hyperlipidemia.  Will get lipid panel and metabolic panel today.  Bilateral chronic pedal edema.  Decided to go ahead and get bilateral lower extremity ultrasounds, BNP and chest x-ray.  Evaluate differential diagnosis.  Suspect that she probably have chronic dependent edema and the degree could also indicate some lymphedema.  Will evaluate studies and consider referring to vascular surgeon.  Follow-up in 1 month or as needed.

## 2020-12-27 NOTE — Addendum Note (Signed)
Addended by: Anabel Halon on: 12/27/2020 10:35 AM   Modules accepted: Orders

## 2020-12-28 ENCOUNTER — Ambulatory Visit (HOSPITAL_BASED_OUTPATIENT_CLINIC_OR_DEPARTMENT_OTHER)
Admission: RE | Admit: 2020-12-28 | Discharge: 2020-12-28 | Disposition: A | Discharge status: Home / Self Care | Payer: Medicare Other | Source: Ambulatory Visit | Attending: Medical | Admitting: Medical

## 2020-12-28 ENCOUNTER — Telehealth: Payer: Self-pay | Admitting: Medical

## 2020-12-28 DIAGNOSIS — R600 Localized edema: Secondary | ICD-10-CM | POA: Insufficient documentation

## 2020-12-28 MED ORDER — ATORVASTATIN CALCIUM 40 MG PO TABS: 40.0000 mg | ORAL_TABLET | Freq: Every day | ORAL | 0 refills | Status: DC

## 2020-12-28 NOTE — Telephone Encounter (Signed)
Rx atorvasttin sent to pt pharmacy.

## 2021-01-04 ENCOUNTER — Other Ambulatory Visit: Payer: Self-pay | Admitting: Neurology

## 2021-01-11 ENCOUNTER — Other Ambulatory Visit: Payer: Self-pay | Admitting: Neurology

## 2021-01-25 ENCOUNTER — Other Ambulatory Visit: Payer: Self-pay | Admitting: Neurology

## 2021-01-27 ENCOUNTER — Other Ambulatory Visit: Payer: Self-pay

## 2021-01-27 ENCOUNTER — Ambulatory Visit (INDEPENDENT_AMBULATORY_CARE_PROVIDER_SITE_OTHER): Payer: Medicare (Managed Care) | Admitting: Medical

## 2021-01-27 VITALS — BP 112/68 | HR 75 | Temp 98.2°F | Resp 20 | Ht 63.0 in | Wt 242.8 lb

## 2021-01-27 DIAGNOSIS — Z8669 Personal history of other diseases of the nervous system and sense organs: Secondary | ICD-10-CM | POA: Diagnosis not present

## 2021-01-27 DIAGNOSIS — G2581 Restless legs syndrome: Secondary | ICD-10-CM

## 2021-01-27 DIAGNOSIS — I890 Lymphedema, not elsewhere classified: Secondary | ICD-10-CM | POA: Diagnosis not present

## 2021-01-27 DIAGNOSIS — E785 Hyperlipidemia, unspecified: Secondary | ICD-10-CM | POA: Diagnosis not present

## 2021-01-27 DIAGNOSIS — T148XXA Other injury of unspecified body region, initial encounter: Secondary | ICD-10-CM

## 2021-01-27 DIAGNOSIS — F319 Bipolar disorder, unspecified: Secondary | ICD-10-CM

## 2021-01-27 MED ORDER — MUPIROCIN 2 % EX OINT: 1.0000 "application " | TOPICAL_OINTMENT | Freq: Two times a day (BID) | CUTANEOUS | 0 refills | Status: DC

## 2021-01-27 NOTE — Progress Notes (Signed)
Subjective:    Patient ID: Teresa Lawrence, female    DOB: [birth year], 55 y.o.   MRN: [id number]  HPI  Pt in for follow up.  She has hx of legs swelling both sides. On first visit with me so  did order bilateral lower ext [place] which showed no dvt and cxr showed no chf. Normal bnp as well.   Hx of bipolar. Pt will see psychiatrist February 10, 2021.  History of seizures.  Continue phenobarbital and clonazepam as prescribed by neurologist.  History of insomnia.  Continue on Belsomra prescribed by specialist.  Intermittent rare GERD type symptoms.  Can continue to use Protonix as needed.  High cholesterol She is now on atorvastatin 40 mg daily.  Medial aspect of left  knee small abrasion that mom noticed yesterday. No fever, no chills or sweats. No yellow dc. Pt not sure how happened. Up to date on tetanus.  Review of Systems  Constitutional: Negative for chills, fatigue and fever.  Respiratory: Negative for cough, chest tightness, shortness of breath and wheezing.   Cardiovascular: Negative for chest pain and palpitations.  Gastrointestinal: Negative for abdominal pain, constipation and nausea.  Genitourinary: Negative for difficulty urinating, dysuria, genital sores and hematuria.  Musculoskeletal: Negative for back pain, myalgias and neck stiffness.  Skin: Negative for rash.  Neurological: Negative for dizziness, speech difficulty, weakness, numbness and headaches.  Hematological: Negative for adenopathy. Does not bruise/bleed easily.  Psychiatric/Behavioral: Negative for behavioral problems, confusion and decreased concentration. The patient is not nervous/anxious.     Past Medical History:  Diagnosis Date  . Abnormal Pap smear   . Anxiety   . Bipolar disorder ([place])   . Dysmenorrhea   . Dysplasia   . Dysrhythmia    episodes of palpitations  . Dysuria   . H/O head injury   . Headache(784.0)   . High blood cholesterol   . HSV-2 infection   . Hx of hypercholesterolemia    . Hx of ovarian cyst   . Hx of seizure disorder   . Mass of ovary    left side  . Menorrhagia   . Mental retardation    mild per Dr. notes in [facility]  . Mild mental retardation   . Perimenopausal vasomotor symptoms   . PMS (premenstrual syndrome)   . PMS (premenstrual syndrome)   . PMS (premenstrual syndrome)   . RLS (restless legs syndrome)   . Seizures ([place])   . Uterine mass   . Vaginal yeast infection 2007   recurrent   . Yeast vaginitis      Social History   Socioeconomic History  . Marital status: Single    Spouse name: Not on file  . Number of children: 0  . Years of education: HS  . Highest education level: Not on file  Occupational History  . Occupation: Unemployed  Tobacco Use  . Smoking status: Former Research scientist (life sciences)  . Smokeless tobacco: Never Used  . Tobacco comment: Quit 10 years ago.  Vaping Use  . Vaping Use: Never used  Substance and Sexual Activity  . Alcohol use: No    Alcohol/week: 0.0 standard drinks  . Drug use: No  . Sexual activity: Not Currently  Other Topics Concern  . Not on file  Social History Narrative   Patient lives with her mom and two cats.   Disabled   Education high school.   Right handed.      Social Determinants of Health   Financial Resource Strain: Not on  file  Food Insecurity: Not on file  Transportation Needs: Not on file  Physical Activity: Not on file  Stress: Not on file  Social Connections: Not on file  Intimate Partner Violence: Not on file    Past Surgical History:  Procedure Laterality Date  . ABDOMINAL HYSTERECTOMY  2013  . COLPOSCOPY    . CYSTOSCOPY Bilateral 05/30/2013   Procedure: CYSTOSCOPY;  Surgeon: Betsy Coder, MD;  Location: [facility];  Service: Gynecology;  Laterality: Bilateral;  . DILATION AND CURETTAGE OF UTERUS    . ENDOMETRIAL BIOPSY    . HYSTEROSCOPY W/ ENDOMETRIAL ABLATION    . LAPAROSCOPIC HYSTERECTOMY Right 05/30/2013   Procedure: HYSTERECTOMY TOTAL LAPAROSCOPIC;  Surgeon: Betsy Coder,  MD;  Location: [facility];  Service: Gynecology;  Laterality: Right;  Right Salpingectomy  . POLYPECTOMY    . WISDOM TOOTH EXTRACTION      Family History  Problem Relation Age of Onset  . Heart attack Father   . Breast cancer Sister     Allergies  Allergen Reactions  . Requip [Ropinirole Hcl] Nausea And Vomiting  . Topamax [Topiramate] Nausea Only  . Zonegran [Zonisamide] Nausea Only    Current Outpatient Medications on File Prior to Visit  Medication Sig Dispense Refill  . acyclovir (ZOVIRAX) 200 MG capsule Take 200 mg by mouth 3 (three) times daily as needed (fever blisters).     Marland Kitchen atorvastatin (LIPITOR) 40 MG tablet Take 1 tablet (40 mg total) by mouth daily. 90 tablet 0  . BELSOMRA 20 MG TABS TAKE 1 TABLET BY MOUTH AT BEDTIME AS NEEDED 30 tablet 0  . Cariprazine HCl (VRAYLAR PO) Take by mouth.    . clonazePAM (KLONOPIN) 2 MG tablet TAKE 1 TABLET BY MOUTH ONCE DAILY IN THE MORNING AND 1 AT NOON AND 2 AT BEDTIME 120 tablet 0  . fluticasone (FLONASE) 50 MCG/ACT nasal spray Place 1 spray into both nostrils daily. 16 g 0  . gabapentin (NEURONTIN) 100 MG capsule TAKE 1 CAPSULE BY MOUTH TWO TIMES DAILY 180 capsule 1  . hydrocortisone cream 1 % Apply 1 application topically as needed (itching).    Marland Kitchen ibuprofen (ADVIL,MOTRIN) 600 MG tablet Take 1 tablet (600 mg total) by mouth every 6 (six) hours as needed (mild pain). 30 tablet 1  . nitrofurantoin, macrocrystal-monohydrate, (MACROBID) 100 MG capsule Take 1 capsule (100 mg total) by mouth 2 (two) times daily. 10 capsule 0  . pantoprazole (PROTONIX) 40 MG tablet Take 40 mg by mouth 2 (two) times daily as needed (for reflux symptoms).     Marland Kitchen PHENobarbital (LUMINAL) 64.8 MG tablet Take 1 tablet (64.8 mg total) by mouth 2 (two) times daily. 180 tablet 1  . pramipexole (MIRAPEX) 0.25 MG tablet TAKE 1 TABLET BY MOUTH TWICE DAILY AND 2 TABLETS AT BEDTIME 360 tablet 1  . promethazine (PHENERGAN) 25 MG tablet Take 25 mg by mouth every 6 (six) hours as  needed for nausea or vomiting.    Marland Kitchen QUEtiapine (SEROQUEL) 25 MG tablet Take 25 mg by mouth at bedtime.    Marland Kitchen VRAYLAR 4.5 MG CAPS Take 1 capsule by mouth at bedtime.     No current facility-administered medications on file prior to visit.    BP 112/68   Pulse 75   Temp 98.2 F (36.8 C)   Resp 20   Ht 5\' 3"  (1.6 m)   Wt 242 lb 12.8 oz (110.1 kg)   LMP 05/11/2013 (LMP Unknown)   SpO2 94%   BMI 43.01  kg/m       Objective:   Physical Exam  General Mental Status- Alert. General Appearance- Not in acute distress.   Skin General: Color- Normal Color. Moisture- Normal Moisture.  Neck Carotid Arteries- Normal color. Moisture- Normal Moisture. No carotid bruits. No JVD.  Chest and Lung Exam Auscultation: Breath Sounds:-Normal.  Cardiovascular Auscultation:Rythm- Regular. Murmurs & Other Heart Sounds:Auscultation of the heart reveals- No Murmurs.  Abdomen Inspection:-Inspeection Normal. Palpation/Percussion:Note:No mass. Palpation and Percussion of the abdomen reveal- Non Tender, Non Distended + BS, no rebound or guarding.   Neurologic Cranial Nerve exam:- CN III-XII intact(No nystagmus), symmetric smile. Strength:- 5/5 equal and symmetric strength both upper and lower extremities.      Assessment & Plan:  History of pedal edema and severe enough that considering lymphedema.  Negative work-up for CHF and DVTs.  In the past reports compression stockings were so tight that cut into skin.  We will go ahead and refer to vascular surgeon for evaluation and treatment.  Mild left knee medial aspect abrasion.  Will prescribe mupirocin on topical antibiotic.  Area does not look actively infected presently but my progress to that.  If any expanding pinkness or discharge around braised area notify me and would prescribe oral antibiotic.  For restless leg syndrome continue Mirapex.  For history of bipolar follow-up with specialists.  History of hyperlipidemia.  Continue  atorvastatin 40 mg daily.  Follow-up early June for follow-up.  We will get fasting lipid panel at that time.  Follow-up sooner if needed.  Mackie Pai, PA-C

## 2021-01-27 NOTE — Patient Instructions (Signed)
History of pedal edema and severe enough that considering lymphedema.  Negative work-up for CHF and DVTs.  In the past reports compression stockings were so tight that cut into skin.  We will go ahead and refer to vascular surgeon for evaluation and treatment.  Mild left knee medial aspect abrasion.  Will prescribe mupirocin on topical antibiotic.  Area does not look actively infected presently but my progress to that.  If any expanding pinkness or discharge around braised area notify me and would prescribe oral antibiotic.  For restless leg syndrome continue Mirapex.  For history of bipolar follow-up with specialists.  History of hyperlipidemia.  Continue atorvastatin 40 mg daily.  Follow-up early June for follow-up.  We will get fasting lipid panel at that time.  Follow-up sooner if needed.

## 2021-02-15 ENCOUNTER — Other Ambulatory Visit: Payer: Self-pay | Admitting: Neurology

## 2021-03-01 ENCOUNTER — Telehealth: Payer: Self-pay | Admitting: Neurology

## 2021-03-01 MED ORDER — PHENOBARBITAL 64.8 MG PO TABS
64.8000 mg | ORAL_TABLET | Freq: Two times a day (BID) | ORAL | 1 refills | Status: DC
Start: 2021-03-01 — End: 2021-06-16

## 2021-03-01 MED ORDER — CLONAZEPAM 2 MG PO TABS: ORAL_TABLET | ORAL | 0 refills | Status: DC

## 2021-03-01 NOTE — Telephone Encounter (Signed)
She called that she was out of clonazepam and phenobarbital.  PDMP reviewed    Refill sent in

## 2021-03-05 ENCOUNTER — Other Ambulatory Visit: Payer: Self-pay

## 2021-03-05 DIAGNOSIS — I890 Lymphedema, not elsewhere classified: Secondary | ICD-10-CM

## 2021-03-12 ENCOUNTER — Other Ambulatory Visit: Payer: Self-pay | Admitting: Neurology

## 2021-03-15 ENCOUNTER — Other Ambulatory Visit: Payer: Self-pay | Admitting: Neurology

## 2021-03-29 ENCOUNTER — Other Ambulatory Visit: Payer: Self-pay | Admitting: Neurology

## 2021-03-31 ENCOUNTER — Other Ambulatory Visit: Payer: Self-pay | Admitting: Neurology

## 2021-03-31 ENCOUNTER — Ambulatory Visit: Payer: Medicare Other | Admitting: Medical

## 2021-03-31 NOTE — Telephone Encounter (Signed)
Pt called and LVM stating she is almost out of her clonazePAM (KLONOPIN) 2 MG tablet and is needing it refilled at the [facility] on [address]

## 2021-04-01 ENCOUNTER — Telehealth: Payer: Self-pay | Admitting: Neurology

## 2021-04-01 MED ORDER — CLONAZEPAM 2 MG PO TABS: ORAL_TABLET | ORAL | 2 refills | Status: DC

## 2021-04-01 NOTE — Telephone Encounter (Signed)
Refill pended/sater

## 2021-04-01 NOTE — Telephone Encounter (Signed)
Pt called needing a refill on her clonazePAM (KLONOPIN) 2 MG tablet sent in to the [facility] on [address]

## 2021-04-02 ENCOUNTER — Other Ambulatory Visit: Payer: Self-pay | Admitting: Medical

## 2021-04-11 ENCOUNTER — Other Ambulatory Visit: Payer: Self-pay

## 2021-04-11 ENCOUNTER — Ambulatory Visit
Admission: EM | Admit: 2021-04-11 | Discharge: 2021-04-11 | Disposition: A | Discharge status: Home / Self Care | Payer: Medicare (Managed Care) | Attending: Emergency Medicine | Admitting: Emergency Medicine

## 2021-04-11 ENCOUNTER — Encounter: Payer: Self-pay | Admitting: Emergency Medicine

## 2021-04-11 DIAGNOSIS — R350 Frequency of micturition: Secondary | ICD-10-CM | POA: Insufficient documentation

## 2021-04-11 DIAGNOSIS — N3001 Acute cystitis with hematuria: Secondary | ICD-10-CM | POA: Diagnosis present

## 2021-04-11 DIAGNOSIS — R300 Dysuria: Secondary | ICD-10-CM | POA: Diagnosis present

## 2021-04-11 LAB — POCT URINALYSIS DIP (MANUAL ENTRY)
Bilirubin, UA: NEGATIVE
Glucose, UA: NEGATIVE mg/dL
Ketones, POC UA: NEGATIVE mg/dL
Leukocytes, UA: NEGATIVE
Nitrite, UA: NEGATIVE
Protein Ur, POC: NEGATIVE mg/dL
Spec Grav, UA: 1.015 (ref 1.010–1.025)
Urobilinogen, UA: 0.2 E.U./dL
pH, UA: 7.5 (ref 5.0–8.0)

## 2021-04-11 MED ORDER — NITROFURANTOIN MONOHYD MACRO 100 MG PO CAPS: 100.0000 mg | ORAL_CAPSULE | Freq: Two times a day (BID) | ORAL | 0 refills | Status: AC

## 2021-04-11 NOTE — ED Triage Notes (Signed)
Patient c/o dysuria x 2 days.   Patient endorses " burning when I pee". Patient endorses " a little blood in pee".   Patient endorses middle lower back pain.   Patient denies ABD pain and foul smell.   Patient hasn't taken any medications for symptoms.

## 2021-04-11 NOTE — ED Provider Notes (Addendum)
[name]    CSN: [id number] Arrival date & time: 04/11/21  1239      History   Chief Complaint Chief Complaint  Patient presents with   Dysuria    HPI Teresa Lawrence is a 55 y.o. female.   Patient presents with dysuria, urinary burning, urinary frequency, and mild hematuria for 2 days. Having some mild bilateral lower back pain but denies any abdominal pain. Denies any fever at home. Denies vaginal discharge, itching or irritation. Denies exposure to STD or risky sexual behavior. Has history of frequent UTI's in the past. Was treated successfully with resolution of symptoms with Macrobid per patient at previous visit. Has never seen a urologist for frequent UTI's.    Dysuria  Past Medical History:  Diagnosis Date   Abnormal Pap smear    Anxiety    Bipolar disorder ([place])    Dysmenorrhea    Dysplasia    Dysrhythmia    episodes of palpitations   Dysuria    H/O head injury    Headache(784.0)    High blood cholesterol    HSV-2 infection    Hx of hypercholesterolemia    Hx of ovarian cyst    Hx of seizure disorder    Mass of ovary    left side   Menorrhagia    Mental retardation    mild per Dr. notes in EPIC   Mild mental retardation    Perimenopausal vasomotor symptoms    PMS (premenstrual syndrome)    PMS (premenstrual syndrome)    PMS (premenstrual syndrome)    RLS (restless legs syndrome)    Seizures ([place])    Uterine mass    Vaginal yeast infection 2007   recurrent    Yeast vaginitis     Patient Active Problem List   Diagnosis Date Noted   RLS (restless legs syndrome)    Acute cystitis with hematuria 05/09/2020   Nondisplaced fracture of lateral malleolus of left fibula, initial encounter for closed fracture 10/25/2017   Insomnia 03/17/2013   Headache 02/23/2013   Hx of yeast infection 04/20/2012   Hx of menorrhagia 04/20/2012   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   Hx of hypercholesterolemia 04/20/2012   Hx of PMS (premenstrual syndrome) 04/20/2012   Encounter for therapeutic drug monitoring 05/01/2011    Past Surgical History:  Procedure Laterality Date   ABDOMINAL HYSTERECTOMY  2013   COLPOSCOPY     CYSTOSCOPY Bilateral 05/30/2013   Procedure: CYSTOSCOPY;  Surgeon: Betsy Coder, MD;  Location: [facility];  Service: Gynecology;  Laterality: Bilateral;   DILATION AND CURETTAGE OF UTERUS     ENDOMETRIAL BIOPSY     HYSTEROSCOPY W/ ENDOMETRIAL ABLATION     LAPAROSCOPIC HYSTERECTOMY Right 05/30/2013   Procedure: HYSTERECTOMY TOTAL LAPAROSCOPIC;  Surgeon: Betsy Coder, MD;  Location: [facility];  Service: Gynecology;  Laterality: Right;  Right Salpingectomy   POLYPECTOMY     WISDOM TOOTH EXTRACTION      OB History   No obstetric history on file.      Home Medications    Prior to Admission medications   Medication Sig Start Date End Date Taking? Authorizing Provider  acyclovir (ZOVIRAX) 200 MG capsule  Take 200 mg by mouth 3 (three) times daily as needed (fever blisters).    Yes [provider]  atorvastatin (LIPITOR) 40 MG tablet Take 1 tablet by mouth once daily 04/02/21  Yes Saguier, Edward, PA-C  BELSOMRA 20 MG TABS TAKE 1 TABLET BY MOUTH AT BEDTIME AS NEEDED 03/12/21  Yes [name], NP  Cariprazine HCl (VRAYLAR PO) Take by mouth.   Yes [provider]  clonazePAM (KLONOPIN) 2 MG tablet TAKE 1 TABLET BY MOUTH IN THE MORNING, 1 TABLET AT NOON AND 2 TABLETS AT BEDTIME 04/01/21  Yes Sater, Nanine Means, MD  fluticasone (FLONASE) 50 MCG/ACT nasal spray Place 1 spray into both nostrils daily. 02/20/20  Yes [name], [name], PA-C  gabapentin (NEURONTIN) 100 MG capsule Take 1 capsule by mouth twice daily  01/29/21  Yes [name], MD  hydrocortisone cream 1 % Apply 1 application topically as needed (itching).   Yes [provider]  nitrofurantoin, macrocrystal-monohydrate, (MACROBID) 100 MG capsule Take 1 capsule (100 mg total) by mouth 2 (two) times daily for 7 days. 04/11/21 04/18/21 Yes Odis Luster, FNP  pantoprazole (PROTONIX) 40 MG tablet Take 40 mg by mouth 2 (two) times daily as needed (for reflux symptoms).    Yes [provider]  PHENobarbital (LUMINAL) 64.8 MG tablet Take 1 tablet (64.8 mg total) by mouth 2 (two) times daily. 03/01/21  Yes Sater, Nanine Means, MD  pramipexole (MIRAPEX) 0.25 MG tablet TAKE 1 TABLET BY MOUTH TWICE DAILY AND 2 TABLETS AT BEDTIME 03/19/21  Yes [name], NP  VRAYLAR 4.5 MG CAPS Take 1 capsule by mouth at bedtime. 11/09/20  Yes [provider]  clonazePAM (KLONOPIN) 2 MG tablet TAKE 1 TABLET BY MOUTH ONCE DAILY IN THE MORNING AND 1 AT NOON AND 2 AT BEDTIME 04/01/21   [name], MD  ibuprofen (ADVIL,MOTRIN) 600 MG tablet Take 1 tablet (600 mg total) by mouth every 6 (six) hours as needed (mild pain). 05/31/13   Earnstine Regal, PA-C  mupirocin ointment (BACTROBAN) 2 % Apply 1 application topically 2 (two) times daily. 01/27/21   Saguier, Percell Miller, PA-C  promethazine (PHENERGAN) 25 MG tablet Take 25 mg by mouth every 6 (six) hours as needed for nausea or vomiting.    [provider]  QUEtiapine (SEROQUEL) 25 MG tablet Take 25 mg by mouth at bedtime.    [provider]    Family History Family History  Problem Relation Age of Onset   Heart attack Father    Breast cancer Sister     Social History Social History   Tobacco Use   Smoking status: Former    Pack years: 0.00   Smokeless tobacco: Never   Tobacco comments:    Quit 10 years ago.  Vaping Use   Vaping Use: Never used  Substance Use Topics   Alcohol use: No    Alcohol/week: 0.0 standard drinks   Drug use: No     Allergies   Requip  [ropinirole hcl], Topamax [topiramate], and Zonegran [zonisamide]   Review of Systems Review of Systems Per HPI  Physical Exam Triage Vital Signs ED Triage Vitals  Enc Vitals Group     BP 04/11/21 1258 120/83     Pulse Rate 04/11/21 1258 89     Resp 04/11/21 1258 16     Temp 04/11/21 1258 100.2 F (37.9 C)     Temp Source 04/11/21 1258 Oral     SpO2 04/11/21 1258 93 %  Weight --      Height --      Head Circumference --      Peak Flow --      Pain Score 04/11/21 1255 3     Pain Loc --      Pain Edu? --      Excl. in [place]? --    No data found.  Updated Vital Signs BP 120/83 (BP Location: Right Arm)   Pulse 89   Temp 100.2 F (37.9 C) (Oral)   Resp 16   LMP 05/11/2013 (LMP Unknown)   SpO2 93%   Visual Acuity Right Eye Distance:   Left Eye Distance:   Bilateral Distance:    Right Eye Near:   Left Eye Near:    Bilateral Near:     Physical Exam Constitutional:      Appearance: Normal appearance.  HENT:     Head: Normocephalic and atraumatic.  Eyes:     Extraocular Movements: Extraocular movements intact.     Conjunctiva/sclera: Conjunctivae normal.  Pulmonary:     Effort: Pulmonary effort is normal.  Genitourinary:    Comments: GU exam deferred. Complains of dysuria and urinary frequency.  Neurological:     General: No focal deficit present.     Mental Status: She is alert and oriented to person, place, and time. Mental status is at baseline.  Psychiatric:        Mood and Affect: Mood normal.        Behavior: Behavior normal.        Thought Content: Thought content normal.        Judgment: Judgment normal.     UC Treatments / Results  Labs (all labs ordered are listed, but only abnormal results are displayed) Labs Reviewed  POCT URINALYSIS DIP (MANUAL ENTRY) - Abnormal; Notable for the following components:      Result Value   Blood, UA large (*)    All other components within normal limits  URINE CULTURE    EKG   Radiology No results  found.  Procedures Procedures (including critical care time)  Medications Ordered in UC Medications - No data to display  Initial Impression / Assessment and Plan / UC Course  I have reviewed the triage vital signs and the nursing notes.  Pertinent labs & imaging results that were available during my care of the patient were reviewed by me and considered in my medical decision making (see chart for details).     Will treat for suspected urinary tract infection due to patient history of multiple UTI's in the past. Low possibility of kidney stone due to patient history and physical exam. Patient advised to monitor fevers and symptoms and to seek care at the hospital if symptoms significantly worsen. Able to follow up at urgent care if symptoms do not improve with current treatment plan. Urine culture pending. Will call patient with results and change treatment if warranted due to results. Fever monitoring and management discussed with patient. Increased water intake. Patient was also advised that it may be necessary to be evaluated by a urologist due to frequent UTI's and was advised to discuss this with PCP. Patient voiced understanding.  Final Clinical Impressions(s) / UC Diagnoses   Final diagnoses:  Acute cystitis with hematuria  Dysuria  Urinary frequency     Discharge Instructions      Urine showed evidence of infection. We are treating you with Macrobid antibiotic. Be sure to take full course. Stay hydrated- urine should be  pale yellow to clear. My continue azo for relief of burning while infection is being cleared.   Please return or follow up with your primary provider if symptoms not improving with treatment. Please return sooner if you have worsening of symptoms or develop fever, nausea, vomiting, abdominal pain, back pain, lightheadedness, dizziness.   Please closely monitor fever and symptoms. If fever or symptoms worsen, please follow up or seek care at the emergency  department.    ED Prescriptions     Medication Sig Dispense Auth. Provider   nitrofurantoin, macrocrystal-monohydrate, (MACROBID) 100 MG capsule Take 1 capsule (100 mg total) by mouth 2 (two) times daily for 7 days. 14 capsule Odis Luster, FNP      PDMP not reviewed this encounter.   Odis Luster, FNP 04/11/21 1341    Odis Luster, [place] 04/11/21 1343

## 2021-04-11 NOTE — Discharge Instructions (Addendum)
Urine showed evidence of infection. We are treating you with Macrobid antibiotic. Be sure to take full course. Stay hydrated- urine should be pale yellow to clear. My continue azo for relief of burning while infection is being cleared.   Please return or follow up with your primary provider if symptoms not improving with treatment. Please return sooner if you have worsening of symptoms or develop fever, nausea, vomiting, abdominal pain, back pain, lightheadedness, dizziness.   Please closely monitor fever and symptoms. If fever or symptoms worsen, please follow up or seek care at the emergency department.

## 2021-04-12 LAB — URINE CULTURE: Culture: <10000 — AB

## 2021-04-17 ENCOUNTER — Encounter: Payer: Medicare Other | Admitting: Vascular Surgery

## 2021-04-17 ENCOUNTER — Encounter (HOSPITAL_COMMUNITY): Payer: Medicare Other

## 2021-04-24 ENCOUNTER — Other Ambulatory Visit: Payer: Self-pay | Admitting: Neurology

## 2021-05-06 ENCOUNTER — Encounter: Payer: Self-pay | Admitting: Vascular Surgery

## 2021-05-06 ENCOUNTER — Encounter (HOSPITAL_COMMUNITY): Payer: Medicare (Managed Care)

## 2021-05-07 ENCOUNTER — Other Ambulatory Visit: Payer: Self-pay | Admitting: Neurology

## 2021-05-20 ENCOUNTER — Telehealth: Payer: Self-pay | Admitting: Neurology

## 2021-05-20 ENCOUNTER — Other Ambulatory Visit: Payer: Self-pay

## 2021-05-20 DIAGNOSIS — I890 Lymphedema, not elsewhere classified: Secondary | ICD-10-CM

## 2021-05-20 NOTE — Telephone Encounter (Signed)
Pt called  wanting to know if she can speak to the RN about the Cologaurd Please advise.

## 2021-05-20 NOTE — Telephone Encounter (Signed)
Called patient who asked if she has to drink anything for cologuard. She then asked if she needs the test. We discussed and I answered her questions. She denied any bowel issues, has not talked to her PCP but stated she "wants to get her colon checked". I advised she call her PCP to discuss. She wants to discuss with Dr Jannifer Franklin in her FU 06/06/21. She had no further questions.  Patient verbalized understanding, appreciation.

## 2021-05-22 ENCOUNTER — Ambulatory Visit: Payer: Medicare Other | Admitting: Neurology

## 2021-05-25 ENCOUNTER — Other Ambulatory Visit: Payer: Self-pay | Admitting: Neurology

## 2021-05-28 ENCOUNTER — Telehealth: Payer: Self-pay | Admitting: Neurology

## 2021-05-28 NOTE — Telephone Encounter (Signed)
Teresa Lawrence who stated she had to cancel Teresa Lawrence's appointment with Dr Jannifer Franklin because she had conflicting appointment. She understands Teresa Roch NP is out for several months. She wants to schedule a follow up with provider Dr Jannifer Franklin recommends. I advised will send to Dr Jannifer Franklin and then we can call her to schedule with new provider. She  verbalized understanding, appreciation.'

## 2021-05-28 NOTE — Telephone Encounter (Signed)
Pt's mother is asking for a call to know who Dr Jannifer Franklin is forwarding pt's care to here in the office, please call.

## 2021-05-29 NOTE — Telephone Encounter (Signed)
Called mother and advised her of Dr Jannifer Franklin' recommendations to see Dr Dohmeier or Dr Leta Baptist.  Scheduled soonest available per mother. She verbalized understanding, appreciation.

## 2021-06-06 ENCOUNTER — Other Ambulatory Visit: Payer: Self-pay

## 2021-06-06 ENCOUNTER — Ambulatory Visit (HOSPITAL_COMMUNITY)
Admission: RE | Admit: 2021-06-06 | Discharge: 2021-06-06 | Disposition: A | Discharge status: Home / Self Care | Payer: Medicare (Managed Care) | Source: Ambulatory Visit | Attending: Vascular Surgery | Admitting: Vascular Surgery

## 2021-06-06 ENCOUNTER — Encounter: Payer: Self-pay | Admitting: Vascular Surgery

## 2021-06-06 ENCOUNTER — Ambulatory Visit: Payer: Medicare Other | Admitting: Neurology

## 2021-06-06 ENCOUNTER — Ambulatory Visit (INDEPENDENT_AMBULATORY_CARE_PROVIDER_SITE_OTHER): Payer: Medicare (Managed Care) | Admitting: Vascular Surgery

## 2021-06-06 VITALS — BP 162/84 | HR 79 | Temp 98.2°F | Resp 20 | Ht 63.0 in | Wt 240.0 lb

## 2021-06-06 DIAGNOSIS — I890 Lymphedema, not elsewhere classified: Secondary | ICD-10-CM | POA: Diagnosis present

## 2021-06-06 NOTE — Progress Notes (Signed)
Patient ID: Teresa Lawrence, female   DOB: 1966/06/23, 55 y.o.   MRN: [id number]  Reason for Consult: New Patient (Initial Visit)   Referred by Tamsen Roers, MD  Subjective:     HPI:  Teresa Lawrence is a 55 y.o. female with history of mental retardation and history of bilateral frozen shoulder.  She has attempted to wear compression stockings in the past but requires her mother for help with this and she cannot get these on.  She does not have any tissue loss or ulceration.  She does have significant pain in her bilateral lower extremities related to the swelling.  She has no personal or family history of DVT.  Past Medical History:  Diagnosis Date   Abnormal Pap smear    Anxiety    Bipolar disorder ([facility])    Dysmenorrhea    Dysplasia    Dysrhythmia    episodes of palpitations   Dysuria    H/O head injury    Headache(784.0)    High blood cholesterol    HSV-2 infection    Hx of hypercholesterolemia    Hx of ovarian cyst    Hx of seizure disorder    Mass of ovary    left side   Menorrhagia    Mental retardation    mild per Dr. notes in EPIC   Mild mental retardation    Perimenopausal vasomotor symptoms    PMS (premenstrual syndrome)    PMS (premenstrual syndrome)    PMS (premenstrual syndrome)    RLS (restless legs syndrome)    Seizures ([facility])    Uterine mass    Vaginal yeast infection 2007   recurrent    Yeast vaginitis    Family History  Problem Relation Age of Onset   Heart attack Father    Breast cancer Sister    Past Surgical History:  Procedure Laterality Date   ABDOMINAL HYSTERECTOMY  2013   COLPOSCOPY     CYSTOSCOPY Bilateral 05/30/2013   Procedure: CYSTOSCOPY;  Surgeon: Betsy Coder, MD;  Location: [facility];  Service: Gynecology;  Laterality: Bilateral;   DILATION AND CURETTAGE OF UTERUS     ENDOMETRIAL BIOPSY     HYSTEROSCOPY W/ ENDOMETRIAL ABLATION     LAPAROSCOPIC HYSTERECTOMY Right 05/30/2013   Procedure: HYSTERECTOMY TOTAL LAPAROSCOPIC;   Surgeon: Betsy Coder, MD;  Location: [facility];  Service: Gynecology;  Laterality: Right;  Right Salpingectomy   POLYPECTOMY     WISDOM TOOTH EXTRACTION      Short Social History:  Social History   Tobacco Use   Smoking status: Former   Smokeless tobacco: Never   Tobacco comments:    Quit 10 years ago.  Substance Use Topics   Alcohol use: No    Alcohol/week: 0.0 standard drinks    Allergies  Allergen Reactions   Requip [Ropinirole Hcl] Nausea And Vomiting   Topamax [Topiramate] Nausea Only   Zonegran [Zonisamide] Nausea Only    Current Outpatient Medications  Medication Sig Dispense Refill   acyclovir (ZOVIRAX) 200 MG capsule Take 200 mg by mouth 3 (three) times daily as needed (fever blisters).      atorvastatin (LIPITOR) 40 MG tablet Take 1 tablet by mouth once daily 90 tablet 0   BELSOMRA 20 MG TABS TAKE 1 TABLET BY MOUTH AT BEDTIME AS NEEDED 30 tablet 0   Cariprazine HCl (VRAYLAR PO) Take by mouth.     clonazePAM (KLONOPIN) 2 MG tablet TAKE 1 TABLET BY MOUTH IN THE MORNING, 1  TABLET AT NOON AND 2 TABLETS AT BEDTIME 120 tablet 0   clonazePAM (KLONOPIN) 2 MG tablet TAKE 1 TABLET BY MOUTH ONCE DAILY IN THE MORNING AND 1 AT NOON AND 2 AT BEDTIME 120 tablet 2   fluticasone (FLONASE) 50 MCG/ACT nasal spray Place 1 spray into both nostrils daily. 16 g 0   gabapentin (NEURONTIN) 100 MG capsule Take 1 capsule by mouth twice daily 180 capsule 1   hydrocortisone cream 1 % Apply 1 application topically as needed (itching).     ibuprofen (ADVIL,MOTRIN) 600 MG tablet Take 1 tablet (600 mg total) by mouth every 6 (six) hours as needed (mild pain). 30 tablet 1   mupirocin ointment (BACTROBAN) 2 % Apply 1 application topically 2 (two) times daily. 22 g 0   pantoprazole (PROTONIX) 40 MG tablet Take 40 mg by mouth 2 (two) times daily as needed (for reflux symptoms).      PHENobarbital (LUMINAL) 64.8 MG tablet Take 1 tablet (64.8 mg total) by mouth 2 (two) times daily. 180 tablet 1    pramipexole (MIRAPEX) 0.25 MG tablet TAKE 1 TABLET BY MOUTH TWICE DAILY AND 2 TABLETS AT BEDTIME 360 tablet 0   promethazine (PHENERGAN) 25 MG tablet Take 25 mg by mouth every 6 (six) hours as needed for nausea or vomiting.     QUEtiapine (SEROQUEL) 25 MG tablet Take 25 mg by mouth at bedtime.     VRAYLAR 4.5 MG CAPS Take 1 capsule by mouth at bedtime.     No current facility-administered medications for this visit.    Review of Systems  Constitutional:  Constitutional negative. HENT: HENT negative.  Eyes: Eyes negative.  Respiratory: Respiratory negative.  Cardiovascular: Positive for leg swelling.  GI: Gastrointestinal negative.  Musculoskeletal: Positive for leg pain.  Neurological: Neurological negative. Hematologic: Hematologic/lymphatic negative.  Psychiatric: Psychiatric negative.       Objective:  Objective   Vitals:   06/06/21 1147  BP: (!) 162/84  Pulse: 79  Resp: 20  Temp: 98.2 F (36.8 C)  SpO2: 94%  Weight: 240 lb (108.9 kg)  Height: '5\' 3"'$  (1.6 m)   Body mass index is 42.51 kg/m.  Physical Exam HENT:     Head: Normocephalic.     Nose:     Comments: Wearing a mask Eyes:     Pupils: Pupils are equal, round, and reactive to light.  Cardiovascular:     Rate and Rhythm: Normal rate and regular rhythm.  Pulmonary:     Effort: Pulmonary effort is normal.  Abdominal:     General: Abdomen is flat.     Palpations: Abdomen is soft.  Musculoskeletal:     Cervical back: Normal range of motion and neck supple.     Right lower leg: Edema present.     Left lower leg: Edema present.  Skin:    General: Skin is warm and dry.     Capillary Refill: Capillary refill takes less than 2 seconds.  Neurological:     General: No focal deficit present.     Mental Status: She is alert.  Psychiatric:        Mood and Affect: Mood normal.        Behavior: Behavior normal.        Thought Content: Thought content normal.    Data: Venous Reflux Times   +--------------+---------+------+-----------+------------+--------+  RIGHT         Reflux NoRefluxReflux TimeDiameter cmsComments  Yes                                   +--------------+---------+------+-----------+------------+--------+  CFV           no                                              +--------------+---------+------+-----------+------------+--------+  FV prox       no                                              +--------------+---------+------+-----------+------------+--------+  FV mid        no                                              +--------------+---------+------+-----------+------------+--------+  FV dist       no                                              +--------------+---------+------+-----------+------------+--------+  Popliteal     no                                              +--------------+---------+------+-----------+------------+--------+  GSV at [facility]    no                            1.05              +--------------+---------+------+-----------+------------+--------+  GSV prox thighno                           0.581              +--------------+---------+------+-----------+------------+--------+  GSV mid thigh no                           0.537              +--------------+---------+------+-----------+------------+--------+  GSV dist thighno                           0.623              +--------------+---------+------+-----------+------------+--------+  GSV at knee   no                           0.646              +--------------+---------+------+-----------+------------+--------+  GSV prox calf                              0.5592             +--------------+---------+------+-----------+------------+--------+  GSV  mid calf                               0.561               +--------------+---------+------+-----------+------------+--------+  SSV Pop Fossa no                           0.308              +--------------+---------+------+-----------+------------+--------+  SSV prox calf no                           0.324              +--------------+---------+------+-----------+------------+--------+  SSV mid calf                               0.366              +--------------+---------+------+-----------+------------+--------+      +--------------+---------+------+-----------+------------+--------+  LEFT          Reflux NoRefluxReflux TimeDiameter cmsComments                          Yes                                   +--------------+---------+------+-----------+------------+--------+  CFV           no                                              +--------------+---------+------+-----------+------------+--------+  FV prox       no                                              +--------------+---------+------+-----------+------------+--------+  FV mid        no                                              +--------------+---------+------+-----------+------------+--------+  FV dist       no                                              +--------------+---------+------+-----------+------------+--------+  Popliteal     no                                              +--------------+---------+------+-----------+------------+--------+  GSV at SFJ    no                           0.615              +--------------+---------+------+-----------+------------+--------+  GSV prox thighno                           0.561              +--------------+---------+------+-----------+------------+--------+  GSV mid thigh no                           0.576              +--------------+---------+------+-----------+------------+--------+  GSV dist thighno                            0.531              +--------------+---------+------+-----------+------------+--------+  GSV at knee   no                           0.569              +--------------+---------+------+-----------+------------+--------+  GSV prox calf                              0.554              +--------------+---------+------+-----------+------------+--------+  GSV mid calf                               0.470              +--------------+---------+------+-----------+------------+--------+  SSV Pop Fossa no                           0.296              +--------------+---------+------+-----------+------------+--------+  SSV prox calf no                           0.376              +--------------+---------+------+-----------+------------+--------+  SSV mid calf                               0.387              +--------------+---------+------+-----------+------------+--------+     Summary:  Bilateral:  - No evidence of deep vein thrombosis seen in the lower extremities,  bilaterally, from the common femoral through the popliteal veins.  - No evidence of superficial venous thrombosis in the lower extremities,  bilaterally.  - No evidence of deep venous insufficiency seen bilaterally in the lower  extremity.  - No evidence of superficial venous reflux seen in the greater saphenous  veins bilaterally.  - No evidence of superficial venous reflux seen in the short saphenous  veins bilaterally.             Assessment/Plan:     55 year old female with bilateral lower extremity swelling.  No venous reflux.  We have discussed the causes of lower extremity swelling her biggest risk factor being her weight we discussed the need for weight loss.  We have also discussed compression stockings unfortunately she has difficulty wearing these with difficulty with assistance.  We have discussed compression socks with zippers have given her information about this.  She can  follow-up with me on an as-needed basis.     Waynetta Sandy MD [facility], [place]

## 2021-06-14 ENCOUNTER — Other Ambulatory Visit: Payer: Self-pay | Admitting: Neurology

## 2021-06-16 ENCOUNTER — Encounter: Payer: Self-pay | Admitting: Neurology

## 2021-06-16 ENCOUNTER — Telehealth: Payer: Self-pay | Admitting: Neurology

## 2021-06-16 ENCOUNTER — Ambulatory Visit (INDEPENDENT_AMBULATORY_CARE_PROVIDER_SITE_OTHER): Payer: Medicare (Managed Care) | Admitting: Neurology

## 2021-06-16 VITALS — BP 129/87 | HR 70 | Ht 62.0 in | Wt 241.0 lb

## 2021-06-16 DIAGNOSIS — F319 Bipolar disorder, unspecified: Secondary | ICD-10-CM | POA: Diagnosis not present

## 2021-06-16 DIAGNOSIS — Z5181 Encounter for therapeutic drug level monitoring: Secondary | ICD-10-CM

## 2021-06-16 DIAGNOSIS — G2581 Restless legs syndrome: Secondary | ICD-10-CM

## 2021-06-16 DIAGNOSIS — R0601 Orthopnea: Secondary | ICD-10-CM

## 2021-06-16 DIAGNOSIS — G40909 Epilepsy, unspecified, not intractable, without status epilepticus: Secondary | ICD-10-CM | POA: Diagnosis not present

## 2021-06-16 DIAGNOSIS — R4183 Borderline intellectual functioning: Secondary | ICD-10-CM

## 2021-06-16 MED ORDER — PHENOBARBITAL 64.8 MG PO TABS: 64.8000 mg | ORAL_TABLET | Freq: Two times a day (BID) | ORAL | 1 refills | Status: DC

## 2021-06-16 MED ORDER — PRAMIPEXOLE DIHYDROCHLORIDE 0.25 MG PO TABS: ORAL_TABLET | ORAL | 2 refills | Status: DC

## 2021-06-16 NOTE — Patient Instructions (Signed)
Seizure, Adult A seizure is a sudden burst of abnormal electrical and chemical activity in thebrain. Seizures usually last from 30 seconds to 2 minutes.  What are the causes? Common causes of this condition include: Fever or infection. Problems that affect the brain. These may include: A brain or head injury. Bleeding in the brain. A brain tumor. Low levels of blood sugar or salt. Kidney problems or liver problems. Conditions that are passed from parent to child (are inherited). Problems with a substance, such as: Having a reaction to a drug or a medicine. Stopping the use of a substance all of a sudden (withdrawal). A stroke. Disorders that affect how you develop. Sometimes, the cause may not be known.  What increases the risk? Having someone in your family who has epilepsy. In this condition, seizures happen again and again over time. They have no clear cause. Having had a tonic-clonic seizure before. This type of seizure causes you to: Tighten the muscles of the whole body. Lose consciousness. Having had a head injury or strokes before. Having had a lack of oxygen at birth. What are the signs or symptoms? There are many types of seizures. The symptoms vary depending on the type ofseizure you have. Symptoms during a seizure Shaking that you cannot control (convulsions) with fast, jerky movements of muscles. Stiffness of the body. Breathing problems. Feeling mixed up (confused). Staring or not responding to sound or touch. Head nodding. Eyes that blink, flutter, or move fast. Drooling, grunting, or making clicking sounds with your mouth Losing control of when you pee or poop. Symptoms before a seizure Feeling afraid, nervous, or worried. Feeling like you may vomit. Feeling like: You are moving when you are not. Things around you are moving when they are not. Feeling like you saw or heard something before (dj vu). Odd tastes or smells. Changes in how you see. You may  see flashing lights or spots. Symptoms after a seizure Feeling confused. Feeling sleepy. Headache. Sore muscles. How is this treated? If your seizure stops on its own, you will not need treatment. If your seizure lasts longer than 5 minutes, you will normally need treatment. Treatment may include: Medicines given through an IV tube. Avoiding things, such as medicines, that are known to cause your seizures. Medicines to prevent seizures. A device to prevent or control seizures. Surgery. A diet low in carbohydrates and high in fat (ketogenic diet). Follow these instructions at home: Medicines Take over-the-counter and prescription medicines only as told by your doctor. Avoid foods or drinks that may keep your medicine from working, such as alcohol. Activity Follow instructions about driving, swimming, or doing things that would be dangerous if you had another seizure. Wait until your doctor says it is safe for you to do these things. If you live in the [place] ask your local department of motor vehicles when you can drive. Get a lot of rest. Teaching others  Teach friends and family what to do when you have a seizure. They should: Help you get down to the ground. Protect your head and body. Loosen any clothing around your neck. Turn you on your side. Know whether or not you need emergency care. Stay with you until you are better. Also, tell them what not to do if you have a seizure. Tell them: They should not hold you down. They should not put anything in your mouth.  General instructions Avoid anything that gives you seizures. Keep a seizure diary. Write down: What you remember about  each seizure. What you think caused each seizure. Keep all follow-up visits. Contact a doctor if: You have another seizure or seizures. Call the doctor each time you have a seizure. The pattern of your seizures changes. You keep having seizures with treatment. You have symptoms of being sick or  having an infection. You are not able to take your medicine. Get help right away if: You have any of these problems: A seizure that lasts longer than 5 minutes. Many seizures in a row and you do not feel better between seizures. A seizure that makes it harder to breathe. A seizure and you can no longer speak or use part of your body. You do not wake up right after a seizure. You get hurt during a seizure. You feel confused or have pain right after a seizure. These symptoms may be an emergency. Get help right away. Call your local emergency services (911 in the [place] Do not wait to see if the symptoms will go away. Do not drive yourself to the hospital. Summary A seizure is a sudden burst of abnormal electrical and chemical activity in the brain. Seizures normally last from 30 seconds to 2 minutes. Causes of seizures include illness, injury to the head, low levels of blood sugar or salt, and certain conditions. Most seizures will stop on their own in less than 5 minutes. Seizures that last longer than 5 minutes are a medical emergency and need treatment right away. Many medicines are used to treat seizures. Take over-the-counter and prescription medicines only as told by your doctor. This information is not intended to replace advice given to you by your health care provider. Make sure you discuss any questions you have with your healthcare provider. Document Revised: 04/12/2020 Document Reviewed: 04/12/2020 Elsevier Patient Education  2022 [employer]. Hypersomnia Hypersomnia is a condition in which a person feels very tired during the day even though he or she gets plenty of sleep at night. A person with this condition may take naps during the day and may find it very difficult to wake up from sleep. Hypersomnia may affect a person's ability to think, concentrate,drive, or remember things. What are the causes? The cause of this condition may not be known. Possible causes  include: Certain medicines. Sleep disorders, such as narcolepsy and sleep apnea. Injury to the head, brain, or spinal cord. Drug or alcohol use. Gastroesophageal reflux disease (GERD). Tumors. Certain medical conditions, such as depression, diabetes, or an underactive thyroid gland (hypothyroidism). What are the signs or symptoms? The main symptoms of hypersomnia include: Feeling very tired throughout the day, regardless of how much sleep you got the night before. Having trouble waking up. Others may find it difficult to wake you up when you are sleeping. Sleeping for longer and longer periods at a time. Taking naps throughout the day. Other symptoms may include: Feeling restless, anxious, or annoyed. Lacking energy. Having trouble with: Remembering. Speaking. Thinking. Loss of appetite. Seeing, hearing, tasting, smelling, or feeling things that are not real (hallucinations). How is this diagnosed? This condition may be diagnosed based on: Your symptoms and medical history. Your sleeping habits. Your health care provider may ask you to write down your sleeping habits in a daily sleep log, along with any symptoms you have. A series of tests that are done while you sleep (sleep study or polysomnogram). A test that measures how quickly you can fall asleep during the day (daytime nap study or multiple sleep latency test). How is this treated? Treatment  can help you manage your condition. Treatment may include: Following a regular sleep routine. Lifestyle changes, such as changing your eating habits, getting regular exercise, and avoiding alcohol or caffeinated beverages. Taking medicines to make you more alert (stimulants) during the day. Treating any underlying medical causes of hypersomnia. Follow these instructions at home: Sleep routine  Schedule the same bedtime and wake-up time each day. Practice a relaxing bedtime routine. This may include reading, meditation, deep  breathing, or taking a warm bath before going to sleep. Get regular exercise each day. Avoid strenuous exercise in the evening hours. Keep your sleep environment at a cooler temperature, darkened, and quiet. Sleep with pillows and a mattress that are comfortable and supportive. Schedule short 20-minute naps for when you feel sleepiest during the day. Talk with your employer or teachers about your hypersomnia. If possible, adjust your schedule so that: You have a regular daytime work schedule. You can take a scheduled nap during the day. You do not have to work or be active at night. Do not eat a heavy meal for a few hours before bedtime. Eat your meals at about the same times every day. Avoid drinking alcohol or caffeinated beverages.  Safety  Do not drive or use heavy machinery if you are sleepy. Ask your health care provider if it is safe for you to drive. Wear a life jacket when swimming or spending time near water.  General instructions Take supplements and over-the-counter and prescription medicines only as told by your health care provider. Keep a sleep log that will help your doctor manage your condition. This may include information about: What time you go to bed each night. How often you wake up at night. How many hours you sleep at night. How often and for how long you nap during the day. Any observations from others, such as leg movements during sleep, sleep walking, or snoring. Keep all follow-up visits as told by your health care provider. This is important. Contact a health care provider if: You have new symptoms. Your symptoms get worse. Get help right away if: You have serious thoughts about hurting yourself or someone else. If you ever feel like you may hurt yourself or others, or have thoughts about taking your own life, get help right away. You can go to your nearest emergency department or call: Your local emergency services (911 in the [place] A suicide crisis  helpline, such as the [facility] at 920-612-7543. This is open 24 hours a day. Summary Hypersomnia refers to a condition in which you feel very tired during the day even though you get plenty of sleep at night. A person with this condition may take naps during the day and may find it very difficult to wake up from sleep. Hypersomnia may affect a person's ability to think, concentrate, drive, or remember things. Treatment, such as following a regular sleep routine and making some lifestyle changes, can help you manage your condition. This information is not intended to replace advice given to you by your health care provider. Make sure you discuss any questions you have with your healthcare provider. Document Revised: 08/15/2020 Document Reviewed: 08/15/2020 Elsevier Patient Education  2022 [employer].

## 2021-06-16 NOTE — Telephone Encounter (Signed)
I sent EEG referral to [facility] for scheduling.

## 2021-06-16 NOTE — Progress Notes (Signed)
SLEEP MEDICINE / NEUROLOGY CLINIC    Provider:  Larey Seat, MD  Primary Care Physician: Retired :  Tamsen Roers, [address] [address] [place] [address]     Referring Provider:  The patient is transferred from Dr Jannifer Franklin.         Chief Complaint according to patient   Patient presents with:     New Patient (Initial Visit)           HISTORY OF PRESENT ILLNESS: 06-16-2021;  Teresa Lawrence is a 55 y.o. Caucasian female patient of Dr.  Jannifer Franklin is seen here to transfer Epilepsy care- seen here on 06/16/2021  Chief concern according to patient :  " I have Epilepsy"  No seizures in years - patient here alone and not sure about time frame. She has massive lymphoedema.    I have the pleasure of seeing Teresa Lawrence today, a right-handed  Caucasian female with a possible sleep disorder.  She   has a past medical history of Abnormal Pap smear, Anxiety, Bipolar disorder ([place]), Dysmenorrhea, Dysplasia, Dysrhythmia, Dysuria, H/O head injury, Headache(784.0), High blood cholesterol, HSV-2 infection, hypercholesterolemia, ovarian cyst, seizure disorder, Mass of ovary, Menorrhagia, Mental retardation, Mild mental retardation, Perimenopausal vasomotor symptoms, PMS (premenstrual syndrome), PMS (premenstrual syndrome), PMS (premenstrual syndrome), RLS (restless legs syndrome), Seizures ([place]), Uterine mass, Vaginal yeast infection (2007), and Yeast vaginitis.  The patient is suffering from OBESITY, MOOD SWINGS, Bipolar- MILD MRDD> she was taken off Seroquel by her psychiatrist, and will not need insomnia medication form here.  She has been    Sleep relevant medical history: Nocturia , Insomnia ,sleeps in a recliner- lymphedema, frozen shoulders.  father passed away from CAD.   Social history:  Patient is disabled,  and lives in a household with mother  persons/ alone. Family status is single, Tobacco use/.  ETOH use /, Caffeine intake in form of Coffee( 1 cup in AM ) Soda( Dr Pepper/ 2-3 week) ) Tea  ( /) or energy drinks. Regular exercise- none        Sleep habits are as follows: The patient's dinner time is between 5-6 PM. The patient goes to bed at 12 PM and continues to sleep for 6-8 hours, wakes for many bathroom breaks, the first time at 2 AM.   The preferred sleep position is in a recliner - Dreams are reportedly rare.  6.45  AM is the usual rise time. She reports not feeling refreshed or restored in AM, with symptoms such as dry mouth, morning headaches, and residual fatigue. Groggy.   Naps are taken frequently, lasting from 1-2 hours.    Review of Systems: Out of a complete 14 system review, the patient complains of only the following symptoms, and all other reviewed systems are negative.:  Fatigue, sleepiness , snoring, fragmented sleep, Insomnia is chronic and related to bipolar disease.  Depression,  Seizures "   How likely are you to doze in the following situations: 0 = not likely, 1 = slight chance, 2 = moderate chance, 3 = high chance   Sitting and Reading? Watching Television? Sitting inactive in a public place (theater or meeting)? As a passenger in a car for an hour without a break? Lying down in the afternoon when circumstances permit? Sitting and talking to someone? Sitting quietly after lunch without alcohol? In a car, while stopped for a few minutes in traffic?   Total = 15/ 24 points   FSS endorsed at 56/ 63  points.   Social History   Socioeconomic History   Marital status: Single    Spouse name: Not on file   Number of children: 0   Years of education: HS   Highest education level: Not on file  Occupational History   Occupation: Unemployed  Tobacco Use   Smoking status: Former   Smokeless tobacco: Never   Tobacco comments:    Quit 10 years ago.  Vaping Use   Vaping Use: Never used  Substance and Sexual Activity   Alcohol use: No    Alcohol/week: 0.0 standard drinks   Drug use: No   Sexual activity: Not Currently  Other Topics Concern    Not on file  Social History Narrative   Patient lives with her mom and two cats.   Disabled   Education high school.   Right handed.      Social Determinants of Health   Financial Resource Strain: Not on file  Food Insecurity: Not on file  Transportation Needs: Not on file  Physical Activity: Not on file  Stress: Not on file  Social Connections: Not on file    Family History  Problem Relation Age of Onset   Heart attack Father    Breast cancer Sister     Past Medical History:  Diagnosis Date   Abnormal Pap smear    Anxiety    Bipolar disorder ([place])    Dysmenorrhea    Dysplasia    Dysrhythmia    episodes of palpitations   Dysuria    H/O head injury    Headache(784.0)    High blood cholesterol    HSV-2 infection    Hx of hypercholesterolemia    Hx of ovarian cyst    Hx of seizure disorder    Mass of ovary    left side   Menorrhagia    Mental retardation    mild per Dr. notes in EPIC   Mild mental retardation    Perimenopausal vasomotor symptoms    PMS (premenstrual syndrome)    PMS (premenstrual syndrome)    PMS (premenstrual syndrome)    RLS (restless legs syndrome)    Seizures ([place])    Uterine mass    Vaginal yeast infection 2007   recurrent    Yeast vaginitis     Past Surgical History:  Procedure Laterality Date   ABDOMINAL HYSTERECTOMY  2013   COLPOSCOPY     CYSTOSCOPY Bilateral 05/30/2013   Procedure: CYSTOSCOPY;  Surgeon: Betsy Coder, MD;  Location: [facility];  Service: Gynecology;  Laterality: Bilateral;   DILATION AND CURETTAGE OF UTERUS     ENDOMETRIAL BIOPSY     HYSTEROSCOPY W/ ENDOMETRIAL ABLATION     LAPAROSCOPIC HYSTERECTOMY Right 05/30/2013   Procedure: HYSTERECTOMY TOTAL LAPAROSCOPIC;  Surgeon: Betsy Coder, MD;  Location: [facility];  Service: Gynecology;  Laterality: Right;  Right Salpingectomy   POLYPECTOMY     WISDOM TOOTH EXTRACTION       Current Outpatient Medications on File Prior to Visit  Medication Sig Dispense Refill    acyclovir (ZOVIRAX) 200 MG capsule Take 200 mg by mouth 3 (three) times daily as needed (fever blisters).      atorvastatin (LIPITOR) 40 MG tablet Take 1 tablet by mouth once daily 90 tablet 0   BELSOMRA 20 MG TABS TAKE 1 TABLET BY MOUTH AT BEDTIME AS NEEDED 30 tablet 0   Cariprazine HCl (VRAYLAR PO) Take by mouth.     clonazePAM (KLONOPIN) 2 MG tablet TAKE 1 TABLET  BY MOUTH IN THE MORNING, 1 TABLET AT NOON AND 2 TABLETS AT BEDTIME 120 tablet 0   clonazePAM (KLONOPIN) 2 MG tablet TAKE 1 TABLET BY MOUTH ONCE DAILY IN THE MORNING AND 1 AT NOON AND 2 AT BEDTIME 120 tablet 2   fluticasone (FLONASE) 50 MCG/ACT nasal spray Place 1 spray into both nostrils daily. 16 g 0   gabapentin (NEURONTIN) 100 MG capsule Take 1 capsule by mouth twice daily 180 capsule 1   hydrocortisone cream 1 % Apply 1 application topically as needed (itching).     ibuprofen (ADVIL,MOTRIN) 600 MG tablet Take 1 tablet (600 mg total) by mouth every 6 (six) hours as needed (mild pain). 30 tablet 1   mupirocin ointment (BACTROBAN) 2 % Apply 1 application topically 2 (two) times daily. 22 g 0   pantoprazole (PROTONIX) 40 MG tablet Take 40 mg by mouth 2 (two) times daily as needed (for reflux symptoms).      PHENobarbital (LUMINAL) 64.8 MG tablet Take 1 tablet (64.8 mg total) by mouth 2 (two) times daily. 180 tablet 1   pramipexole (MIRAPEX) 0.25 MG tablet TAKE 1 TABLET BY MOUTH TWICE DAILY AND 2 TABLETS AT BEDTIME 360 tablet 0   promethazine (PHENERGAN) 25 MG tablet Take 25 mg by mouth every 6 (six) hours as needed for nausea or vomiting.     QUEtiapine (SEROQUEL) 25 MG tablet Take 25 mg by mouth at bedtime.     VRAYLAR 4.5 MG CAPS Take 1 capsule by mouth at bedtime.     No current facility-administered medications on file prior to visit.    Allergies  Allergen Reactions   Requip [Ropinirole Hcl] Nausea And Vomiting   Topamax [Topiramate] Nausea Only   Zonegran [Zonisamide] Nausea Only    Physical exam:  Today's Vitals    06/16/21 1024  BP: 129/87  Pulse: 70  Weight: 241 lb (109.3 kg)  Height: '5\' 2"'$  (1.575 m)   Body mass index is 44.08 kg/m.   Wt Readings from Last 3 Encounters:  06/16/21 241 lb (109.3 kg)  06/06/21 240 lb (108.9 kg)  01/27/21 242 lb 12.8 oz (110.1 kg)     Ht Readings from Last 3 Encounters:  06/16/21 '5\' 2"'$  (1.575 m)  06/06/21 '5\' 3"'$  (1.6 m)  01/27/21 '5\' 3"'$  (1.6 m)      General: The patient is awake, alert and appears not in acute distress. The patient is well groomed. Her face is masked and she is psychomotor slowed.  Head: Normocephalic, atraumatic. Neck is supple. Mallampati ,  neck circumference:16.25 inches . Nasal airflow barely patent.  Retrognathia is  seen.  Dental status:  Cardiovascular:  Regular rate and cardiac rhythm by pulse,  without distended neck veins. Respiratory: Lungs are clear to auscultation.  Skin: DRY , massive leg edema,   Neurologic exam : The patient is awake and alert, oriented to place and time.   Memory subjective described as impaired , MRDD Attention span & concentration ability appears limited  Speech  with dysarthria, dysphonia and slowed  Mood and affect are delayed, slowed, lethargc    Cranial nerves: no loss of smell or taste reported  Pupils are equal and briskly reactive to light. Funduscopic exam deferred. .  Extraocular movements in vertical and horizontal planes were intact and without nystagmus. No Diplopia. Visual fields by finger perimetry are intact. Hearing was intact to soft voice and finger rubbing.    Facial sensation intact to fine touch.  Facial motor strength is symmetric and  tongue and uvula move midline.  Neck ROM : limited rotation, tilt and flexion extension and frozen shoulder.   Motor exam:  slowed motion, bradykinesia.  Symmetric bulk, tone , elevated tone without cog wheeling, symmetric grip strength .   Coordination: Rapid alternating movements in the fingers/hands were of normal speed.  The Finger-to-nose  maneuver was very slow -without evidence of ataxia, dysmetria or tremor.   Gait and station: Patient could rise unassisted from a seated position, walked without assistive device.  Deep tendon reflexes: in the  upper and lower extremities are symmetric.     After spending a total time of  45 minutes face to face and additional time for physical and neurologic examination, review of laboratory studies,  personal review of imaging studies, reports and results of other testing and review of referral information / records as far as provided in visit, I have established the following assessments:  Mr. Dunckel is a 55 year old Caucasian female with a medical history of remote seizure activity.  She has been treated with phenobarbital for decades now.  Weaning her off phenobarbital is not feasible so we will continue the treatment it has helped her to sleep at night and she said that it does moderate her mood swings.  It she has orthopnea she sleeps in a recliner, she does have dizziness when she first stands up after being seated or having lay down.  These are lightheadedness spells that passed quickly.  She had no falls.  No tremors were visible.  She takes relatively high-dose clonazepam which is probably an anxiety treatment and she has been followed by psychiatry she no longer takes Seroquel but she is on Vraylar and she has been using Belsomra for sleep initiation and is happy with that.  I see that Belsomra was prescribed by Dr. Jannifer Franklin nurse practitioner.  I also see that she was prescribed Neurontin here in this clinic.  And the Klonopin apparently is provided by [facility] as well.  There is again no longer Seroquel on board.  Mirapex she is taking Mirapex which is also provided by Butler Denmark nurse practitioner for Dr. Jannifer Franklin.  1) MRDD with question of seizures, onset at age 61 " [place] mal"   2) RLS related to medication and edema. 3) Bipolar disease caused insomnia, not organic. 40 reports being sleepy  and fatigued, screening t for OSA.    My Plan is to proceed with:  1) EEG 2) HST or attended sleep study for seizure, and RLS with high risk of OSA.  3) refills.   I would like to thank Tamsen Roers, MD and Tamsen Roers, [place],  [place] [address] for allowing me to meet with and to take care of this pleasant patient.  I plan to follow up through our NP Butler Denmark within 4 month.   CC: I will share my notes with Dr. Jannifer Franklin.   Electronically signed by: Larey Seat, MD 06/16/2021 10:40 AM  [facility] certified by The [employer] of Sleep Medicine and Diplomate of the [employer] of Sleep Medicine. Board certified In Neurology through the [place], Fellow of the [employer] of Neurology. Medical Director of [employer].

## 2021-06-21 ENCOUNTER — Other Ambulatory Visit: Payer: Self-pay | Admitting: Neurology

## 2021-06-24 ENCOUNTER — Telehealth: Payer: Self-pay

## 2021-06-24 NOTE — Telephone Encounter (Signed)
I informed the pt her insurance [facility]) is out of network with a [facility] facility which includes our office. Pt asked that I speak to her mother. Pt's mom got on the phone and I informed her our offfice along with any [facility] facility is considered out of network with the pt's insurance. She said ok and asked why Dr. Brett Fairy ordered for the pt to have a sleep study. I went over that info with her. She said they will talk to Dr. Brett Fairy before proceeding. I told her that is fine but just keep in mind Dr. Lurline Del office is out of network with the pt's insurance and insurance may not cover any of the cost. She said ok. Also, I informed her that the pt is schedule for an EEG with [facility] and that will be considered out of network too. She verbalized understanding. She will call back if she has any questions.

## 2021-06-25 NOTE — Telephone Encounter (Signed)
Patient mother Vaughan Basta on the [facility] called to cancel the patient EEG appointment because they stated medicaid [employer] is not in network.. she wanted to cancel at this time.. I send a message to Hinton Dyer to cancel the appointment.

## 2021-06-30 ENCOUNTER — Other Ambulatory Visit: Payer: Self-pay | Admitting: Medical

## 2021-07-03 ENCOUNTER — Other Ambulatory Visit: Payer: Medicare (Managed Care)

## 2021-07-10 ENCOUNTER — Telehealth: Payer: Self-pay

## 2021-07-10 NOTE — Telephone Encounter (Signed)
Pt is out of network with [place]. Pt was called and notified of this. Pt wants to call her insurance to find out what her cost is. Pt will call us back to schedule sleep study when she is ready.

## 2021-08-02 ENCOUNTER — Other Ambulatory Visit: Payer: Self-pay | Admitting: Neurology

## 2021-08-29 ENCOUNTER — Other Ambulatory Visit: Payer: Self-pay

## 2021-08-29 ENCOUNTER — Encounter: Payer: Self-pay | Admitting: Emergency Medicine

## 2021-08-29 ENCOUNTER — Ambulatory Visit
Admission: EM | Admit: 2021-08-29 | Discharge: 2021-08-29 | Disposition: A | Discharge status: Home / Self Care | Payer: Medicare (Managed Care) | Attending: Internal Medicine | Admitting: Internal Medicine

## 2021-08-29 DIAGNOSIS — R339 Retention of urine, unspecified: Secondary | ICD-10-CM

## 2021-08-29 LAB — POCT URINALYSIS DIP (MANUAL ENTRY)
Bilirubin, UA: NEGATIVE
Glucose, UA: NEGATIVE mg/dL
Ketones, POC UA: NEGATIVE mg/dL
Leukocytes, UA: NEGATIVE
Nitrite, UA: NEGATIVE
Protein Ur, POC: NEGATIVE mg/dL
Spec Grav, UA: 1.015 (ref 1.010–1.025)
Urobilinogen, UA: 0.2 E.U./dL
pH, UA: 7 (ref 5.0–8.0)

## 2021-08-29 MED ORDER — TAMSULOSIN HCL 0.4 MG PO CAPS: 0.4000 mg | ORAL_CAPSULE | Freq: Every day | ORAL | 1 refills | Status: DC

## 2021-08-29 NOTE — ED Triage Notes (Signed)
Urinary frequency with dysuria, states it feels like she can't fully empty her bladder. Has been going on for "a long time."

## 2021-08-29 NOTE — Discharge Instructions (Addendum)
Increase oral fluid intake Please take medications as prescribed If you have any worsening symptoms please return to urgent care to be reevaluated. If your symptoms persist you may benefit from a urology for further evaluation.

## 2021-08-29 NOTE — ED Provider Notes (Signed)
[name]    CSN: [id number] Arrival date & time: 08/29/21  1320      History   Chief Complaint Chief Complaint  Patient presents with   Urinary Frequency    HPI Teresa Lawrence is a 55 y.o. female comes to the urgent care with several months history of urinary frequency, urinary retention and some dysuria.  Patient says symptoms started several months ago and has been persistent.  Patient has a feeling of incomplete bladder emptying.  She has a history of hysterectomy.  No bloody urine.  No weight loss.  No night sweats.  No vaginal discharge.  Patient is not sexually active.   HPI  Past Medical History:  Diagnosis Date   Abnormal Pap smear    Anxiety    Bipolar disorder ([place])    Dysmenorrhea    Dysplasia    Dysrhythmia    episodes of palpitations   Dysuria    H/O head injury    Headache(784.0)    High blood cholesterol    HSV-2 infection    Hx of hypercholesterolemia    Hx of ovarian cyst    Hx of seizure disorder    Mass of ovary    left side   Menorrhagia    Mental retardation    mild per Dr. 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   RLS (restless legs syndrome)    Acute cystitis with hematuria 05/09/2020   Nondisplaced fracture of lateral malleolus of left fibula, initial encounter for closed fracture 10/25/2017   Insomnia 03/17/2013   Headache 02/23/2013   Hx of yeast infection 04/20/2012   Hx of menorrhagia 04/20/2012   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   Bipolar disorder ([place])  04/20/2012   Hx of menorrhagia 04/20/2012   Hx of ovarian cyst 04/20/2012   Epilepsy ([place]) 04/20/2012   Hx of seizure disorder 04/20/2012   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   Hx of dysmenorrhea 04/20/2012   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   Hx of hypercholesterolemia 04/20/2012   Hx of PMS (premenstrual syndrome) 04/20/2012   Encounter for therapeutic drug monitoring 05/01/2011    Past Surgical History:  Procedure Laterality Date   ABDOMINAL HYSTERECTOMY  2013   COLPOSCOPY     CYSTOSCOPY Bilateral 05/30/2013   Procedure: CYSTOSCOPY;  Surgeon: Betsy Coder, MD;  Location: [facility];  Service: Gynecology;  Laterality: Bilateral;   DILATION AND CURETTAGE OF UTERUS     ENDOMETRIAL BIOPSY     HYSTEROSCOPY W/ ENDOMETRIAL ABLATION     LAPAROSCOPIC HYSTERECTOMY Right 05/30/2013   Procedure: HYSTERECTOMY TOTAL LAPAROSCOPIC;  Surgeon: Betsy Coder, MD;  Location: [facility];  Service: Gynecology;  Laterality: Right;  Right Salpingectomy   POLYPECTOMY     WISDOM TOOTH EXTRACTION      OB History   No obstetric history on file.      Home Medications    Prior to Admission medications   Medication Sig Start Date End Date Taking? Authorizing Provider  tamsulosin (FLOMAX) 0.4 MG CAPS capsule Take 1 capsule (0.4 mg total) by mouth daily after supper. 08/29/21  Yes Eryc Bodey, Myrene Galas, MD  acyclovir (ZOVIRAX) 200 MG capsule Take 200 mg by mouth 3 (three) times daily as needed (fever blisters).     [provider]  atorvastatin (LIPITOR) 40 MG tablet Take 1 tablet by mouth once daily 07/01/21   Saguier, Percell Miller, PA-C  Cariprazine HCl (VRAYLAR PO) Take by mouth.    [provider]  clonazePAM (KLONOPIN) 2 MG tablet TAKE 1 TABLET BY MOUTH IN THE MORNING, 1 TABLET AT NOON AND 2 TABLETS AT BEDTIME 04/01/21   Sater, Nanine Means, MD  clonazePAM (KLONOPIN) 2 MG tablet TAKE 1 TABLET BY MOUTH ONCE DAILY IN THE MORNING AND 1 AT NOON AND 2 AT BEDTIME 04/01/21   [name], MD  fluticasone  [facility]) 50 MCG/ACT nasal spray Place 1 spray into both nostrils daily. 02/20/20   [name], [name], PA-C  gabapentin (NEURONTIN) 100 MG capsule Take 1 capsule by mouth twice daily 08/04/21   [name], MD  hydrocortisone cream 1 % Apply 1 application topically as needed (itching).    [provider]  ibuprofen (ADVIL,MOTRIN) 600 MG tablet Take 1 tablet (600 mg total) by mouth every 6 (six) hours as needed (mild pain). 05/31/13   Earnstine Regal, PA-C  mupirocin ointment (BACTROBAN) 2 % Apply 1 application topically 2 (two) times daily. 01/27/21   Saguier, Percell Miller, PA-C  pantoprazole (PROTONIX) 40 MG tablet Take 40 mg by mouth 2 (two) times daily as needed (for reflux symptoms).     [provider]  PHENobarbital (LUMINAL) 64.8 MG tablet Take 1 tablet (64.8 mg total) by mouth 2 (two) times daily. 06/16/21   Dohmeier, Asencion Partridge, MD  pramipexole (MIRAPEX) 0.25 MG tablet 1 in Am, 1 in Pm and 2 at night po. 06/16/21   Dohmeier, Asencion Partridge, MD  promethazine (PHENERGAN) 25 MG tablet Take 25 mg by mouth every 6 (six) hours as needed for nausea or vomiting.    [provider]  Suvorexant (BELSOMRA) 20 MG TABS Take 20 mg by mouth at bedtime as needed. 06/24/21   [name], MD  VRAYLAR 4.5 MG CAPS Take 1 capsule by mouth at bedtime. 11/09/20   [provider]    Family History Family History  Problem Relation Age of Onset   Heart attack Father    Breast cancer Sister     Social History Social History   Tobacco Use   Smoking status: Former   Smokeless tobacco: Never   Tobacco comments:    Quit 10 years ago.  Vaping Use   Vaping Use: Never used  Substance Use Topics   Alcohol use: No    Alcohol/week: 0.0 standard drinks   Drug use: No     Allergies   Codeine, Requip [ropinirole hcl], Topamax [topiramate], and Zonegran [zonisamide]   Review of Systems Review of Systems  Gastrointestinal: Negative.  Negative for abdominal pain.  Genitourinary:   Positive for difficulty urinating and dysuria. Negative for dyspareunia, urgency, vaginal discharge and vaginal pain.  Neurological: Negative.     Physical Exam Triage Vital Signs ED Triage Vitals [08/29/21 1457]  Enc Vitals Group     BP 125/74     Pulse Rate 74     Resp 16     Temp 98.7 F (37.1 C)     Temp Source Oral     SpO2 95 %     Weight  Height      Head Circumference      Peak Flow      Pain Score 5     Pain Loc      Pain Edu?      Excl. in [place]?    No data found.  Updated Vital Signs BP 125/74 (BP Location: Left Arm)   Pulse 74   Temp 98.7 F (37.1 C) (Oral)   Resp 16   LMP 05/11/2013 (LMP Unknown)   SpO2 95%   Visual Acuity Right Eye Distance:   Left Eye Distance:   Bilateral Distance:    Right Eye Near:   Left Eye Near:    Bilateral Near:     Physical Exam Vitals and nursing note reviewed.  Constitutional:      General: She is not in acute distress.    Appearance: She is not ill-appearing.  HENT:     Mouth/Throat:     Mouth: Mucous membranes are dry.  Cardiovascular:     Rate and Rhythm: Normal rate and regular rhythm.  Pulmonary:     Effort: Pulmonary effort is normal.     Breath sounds: Normal breath sounds.  Abdominal:     General: Bowel sounds are normal. There is no distension.     Palpations: Abdomen is soft.     Tenderness: There is no guarding.     Hernia: No hernia is present.  Musculoskeletal:        General: No swelling or tenderness. Normal range of motion.  Neurological:     Mental Status: She is alert.     UC Treatments / Results  Labs (all labs ordered are listed, but only abnormal results are displayed) Labs Reviewed  POCT URINALYSIS DIP (MANUAL ENTRY) - Abnormal; Notable for the following components:      Result Value   Blood, UA moderate (*)    All other components within normal limits    EKG   Radiology No results found.  Procedures Procedures (including critical care time)  Medications Ordered in  UC Medications - No data to display  Initial Impression / Assessment and Plan / UC Course  I have reviewed the triage vital signs and the nursing notes.  Pertinent labs & imaging results that were available during my care of the patient were reviewed by me and considered in my medical decision making (see chart for details).     1.  Urinary retention with incomplete bladder emptying: Point-of-care urinalysis is positive for blood Flomax daily to help with urinary flow Increase oral fluid intake If symptoms persist patient may benefit from urology evaluation. Final Clinical Impressions(s) / UC Diagnoses   Final diagnoses:  Urinary retention with incomplete bladder emptying     Discharge Instructions      Increase oral fluid intake Please take medications as prescribed If you have any worsening symptoms please return to urgent care to be reevaluated. If your symptoms persist you may benefit from a urology for further evaluation.     ED Prescriptions     Medication Sig Dispense Auth. Provider   tamsulosin (FLOMAX) 0.4 MG CAPS capsule Take 1 capsule (0.4 mg total) by mouth daily after supper. 30 capsule Gianina Olinde, Myrene Galas, MD      PDMP not reviewed this encounter.   Chase Picket, MD 08/29/21 1728

## 2021-09-15 ENCOUNTER — Ambulatory Visit
Admission: RE | Admit: 2021-09-15 | Discharge: 2021-09-15 | Disposition: A | Discharge status: Home / Self Care | Payer: Medicare (Managed Care) | Source: Ambulatory Visit | Attending: Internal Medicine | Admitting: Internal Medicine

## 2021-09-15 ENCOUNTER — Other Ambulatory Visit: Payer: Self-pay

## 2021-09-15 VITALS — BP 128/66 | HR 75 | Temp 97.9°F | Resp 16

## 2021-09-15 DIAGNOSIS — H6122 Impacted cerumen, left ear: Secondary | ICD-10-CM

## 2021-09-15 NOTE — ED Provider Notes (Signed)
[facility]    CSN: [id number] Arrival date & time: Lawrence  5956      History   Chief Complaint Chief Complaint  Patient presents with   Otalgia   Appointment    HPI Teresa Lawrence is a 55 y.o. female.   Patient presents with bilateral ear discomfort that started approximately 4 days ago.  Patient denies any fevers, injuries to the ear, ear drainage, any upper respiratory symptoms.  Patient reports that her ears are "plugged up".  Patient attempted to remove wax from the left ear and noticed some blood on the Q-tip when symptoms first started.  Patient does have some decreased hearing in the left ear.   Otalgia  Past Medical History:  Diagnosis Date   Abnormal Pap smear    Anxiety    Bipolar disorder ([place])    Dysmenorrhea    Dysplasia    Dysrhythmia    episodes of palpitations   Dysuria    H/O head injury    Headache(784.0)    High blood cholesterol    HSV-2 infection    Hx of hypercholesterolemia    Hx of ovarian cyst    Hx of seizure disorder    Mass of ovary    left side   Menorrhagia    Mental retardation    mild per Dr. 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   RLS (restless legs syndrome)    Acute cystitis with hematuria 05/09/2020   Nondisplaced fracture of lateral malleolus of left fibula, initial encounter for closed fracture 10/25/2017   Insomnia 03/17/2013   Headache 02/23/2013   Hx of yeast infection 04/20/2012   Hx of menorrhagia 04/20/2012   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    Bipolar disorder ([place]) 04/20/2012   Hx of menorrhagia 04/20/2012   Hx of ovarian cyst 04/20/2012   Epilepsy ([place]) 04/20/2012   Hx of seizure disorder 04/20/2012   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   Hx of dysmenorrhea 04/20/2012   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   Hx of hypercholesterolemia 04/20/2012   Hx of PMS (premenstrual syndrome) 04/20/2012   Encounter for therapeutic drug monitoring 05/01/2011    Past Surgical History:  Procedure Laterality Date   ABDOMINAL HYSTERECTOMY  2013   COLPOSCOPY     CYSTOSCOPY Bilateral 05/30/2013   Procedure: CYSTOSCOPY;  Surgeon: Betsy Coder, MD;  Location: [facility];  Service: Gynecology;  Laterality: Bilateral;   DILATION AND CURETTAGE OF UTERUS     ENDOMETRIAL BIOPSY     HYSTEROSCOPY W/ ENDOMETRIAL ABLATION     LAPAROSCOPIC HYSTERECTOMY Right 05/30/2013   Procedure: HYSTERECTOMY TOTAL LAPAROSCOPIC;  Surgeon: Betsy Coder, MD;  Location: [facility];  Service: Gynecology;  Laterality: Right;  Right Salpingectomy   POLYPECTOMY     WISDOM TOOTH EXTRACTION      OB History   No obstetric history on file.      Home Medications    Prior to Admission medications   Medication Sig Start Date End  Date Taking? Authorizing Provider  acyclovir (ZOVIRAX) 200 MG capsule Take 200 mg by mouth 3 (three) times daily as needed (fever blisters).     [provider]  atorvastatin (LIPITOR) 40 MG tablet Take 1 tablet by mouth once daily 07/01/21   Saguier, Percell Miller, PA-C  Cariprazine HCl (VRAYLAR PO) Take by mouth.    [provider]  clonazePAM (KLONOPIN) 2 MG tablet TAKE 1 TABLET BY MOUTH IN THE MORNING, 1 TABLET AT NOON AND 2 TABLETS AT BEDTIME 04/01/21   Sater, Nanine Means, MD  clonazePAM (KLONOPIN) 2 MG tablet TAKE 1 TABLET BY MOUTH ONCE DAILY IN THE MORNING AND 1 AT NOON AND 2 AT BEDTIME 04/01/21   [name], MD  fluticasone [facility]) 50 MCG/ACT nasal spray Place 1 spray into both nostrils daily. 02/20/20   [name], [name], PA-C   gabapentin (NEURONTIN) 100 MG capsule Take 1 capsule by mouth twice daily 08/04/21   [name], MD  hydrocortisone cream 1 % Apply 1 application topically as needed (itching).    [provider]  ibuprofen (ADVIL,MOTRIN) 600 MG tablet Take 1 tablet (600 mg total) by mouth every 6 (six) hours as needed (mild pain). 05/31/13   Earnstine Regal, PA-C  mupirocin ointment (BACTROBAN) 2 % Apply 1 application topically 2 (two) times daily. 01/27/21   Saguier, Percell Miller, PA-C  pantoprazole (PROTONIX) 40 MG tablet Take 40 mg by mouth 2 (two) times daily as needed (for reflux symptoms).     [provider]  PHENobarbital (LUMINAL) 64.8 MG tablet Take 1 tablet (64.8 mg total) by mouth 2 (two) times daily. 06/16/21   Dohmeier, Asencion Partridge, MD  pramipexole (MIRAPEX) 0.25 MG tablet 1 in Am, 1 in Pm and 2 at night po. 06/16/21   Dohmeier, Asencion Partridge, MD  promethazine (PHENERGAN) 25 MG tablet Take 25 mg by mouth every 6 (six) hours as needed for nausea or vomiting.    [provider]  Suvorexant (BELSOMRA) 20 MG TABS Take 20 mg by mouth at bedtime as needed. 06/24/21   [name], MD  tamsulosin (FLOMAX) 0.4 MG CAPS capsule Take 1 capsule (0.4 mg total) by mouth daily after supper. 08/29/21   Lamptey, Myrene Galas, MD  VRAYLAR 4.5 MG CAPS Take 1 capsule by mouth at bedtime. 11/09/20   [provider]    Family History Family History  Problem Relation Age of Onset   Heart attack Father    Breast cancer Sister     Social History Social History   Tobacco Use   Smoking status: Former   Smokeless tobacco: Never   Tobacco comments:    Quit 10 years ago.  Vaping Use   Vaping Use: Never used  Substance Use Topics   Alcohol use: No    Alcohol/week: 0.0 standard drinks   Drug use: No     Allergies   Codeine, Requip [ropinirole hcl], Topamax [topiramate], and Zonegran [zonisamide]   Review of Systems Review of Systems Per HPI  Physical Exam Triage Vital Signs ED Triage  Vitals  Enc Vitals Group     BP Lawrence 1024 128/66     Pulse Rate [place] 1024 75     Resp Lawrence 1024 16     Temp Lawrence 1024 97.9 F (36.6 C)     Temp Source Lawrence 1024 Oral     SpO2 Lawrence 1024 92 %     Weight --      Height --      Head Circumference --  Peak Flow --      Pain Score Lawrence 1135 0     Pain Loc --      Pain Edu? --      Excl. in [place]? --    No data found.  Updated Vital Signs BP 128/66 (BP Location: Left Arm)   Pulse 75   Temp 97.9 F (36.6 C) (Oral)   Resp 16   LMP 05/11/2013 (LMP Unknown)   SpO2 96%   Visual Acuity Right Eye Distance:   Left Eye Distance:   Bilateral Distance:    Right Eye Near:   Left Eye Near:    Bilateral Near:     Physical Exam Constitutional:      General: She is not in acute distress.    Appearance: Normal appearance. She is not toxic-appearing or diaphoretic.  HENT:     Head: Normocephalic and atraumatic.     Right Ear: Tympanic membrane, ear canal and external ear normal. No drainage, swelling or tenderness. No middle ear effusion. There is no impacted cerumen. No foreign body. No mastoid tenderness. Tympanic membrane is not perforated, erythematous or bulging.     Left Ear: Ear canal and external ear normal. No drainage, swelling or tenderness.  No middle ear effusion. There is impacted cerumen. No foreign body. No mastoid tenderness. Tympanic membrane is not perforated, erythematous or bulging.     Ears:     Comments: Impacted cerumen to left tympanic membrane on original exam.  Ear irrigation performed to left ear.  Normal tympanic membrane and left external canal on second physical exam. Eyes:     Extraocular Movements: Extraocular movements intact.     Conjunctiva/sclera: Conjunctivae normal.  Pulmonary:     Effort: Pulmonary effort is normal.  Neurological:     General: No focal deficit present.     Mental Status: She is alert and oriented to person, place, and time. Mental status is at  baseline.  Psychiatric:        Mood and Affect: Mood normal.        Behavior: Behavior normal.        Thought Content: Thought content normal.        Judgment: Judgment normal.     UC Treatments / Results  Labs (all labs ordered are listed, but only abnormal results are displayed) Labs Reviewed - No data to display  EKG   Radiology No results found.  Procedures Procedures (including critical Lawrence time)  Medications Ordered in UC Medications - No data to display  Initial Impression / Assessment and Plan / UC Course  I have reviewed the triage vital signs and the nursing notes.  Pertinent labs & imaging results that were available during my [place] of the patient were reviewed by me and considered in my medical decision making (see chart for details).     Ear irrigation performed to remove impacted cerumen that was successful.  Ear on exam after irrigation was completely benign and normal.  Advised patient to follow-up if any ear pain develops in the next few days.  Discussed return precautions.  Patient verbalized understanding and was agreeable with plan. Final Clinical Impressions(s) / UC Diagnoses   Final diagnoses:  Impacted cerumen of left ear     Discharge Instructions      Your ear has been washed out.  Please follow-up if ear pain occurs in the next few days.    ED Prescriptions   None    PDMP not reviewed this encounter.  Teresa [place], [place] 1201

## 2021-09-15 NOTE — Discharge Instructions (Signed)
Your ear has been washed out.  Please follow-up if ear pain occurs in the next few days.

## 2021-09-15 NOTE — ED Triage Notes (Signed)
Reports she removed ear wax from her left ear and noticed some blood on the q-tip on Friday. Says her left hurts and that she feels like both ears are "plugged up." Says she can't hear out of her left ear.

## 2021-09-26 ENCOUNTER — Other Ambulatory Visit: Payer: Self-pay | Admitting: Medical

## 2021-10-06 ENCOUNTER — Other Ambulatory Visit: Payer: Self-pay | Admitting: Neurology

## 2021-10-06 MED ORDER — CLONAZEPAM 2 MG PO TABS: ORAL_TABLET | ORAL | 2 refills | Status: DC

## 2021-10-06 NOTE — Telephone Encounter (Signed)
Pt request refill for clonazePAM (KLONOPIN) 2 MG tablet at [facility]. Has spoken with the pharmacy and they told me medication had been denied. Would like a call from the nurse.

## 2021-10-06 NOTE — Telephone Encounter (Signed)
Pt has scheduled an appt 11/12/21 at 2:15p.

## 2021-10-14 ENCOUNTER — Other Ambulatory Visit: Payer: Self-pay | Admitting: *Deleted

## 2021-10-14 MED ORDER — GABAPENTIN 100 MG PO CAPS: 100.0000 mg | ORAL_CAPSULE | Freq: Two times a day (BID) | ORAL | 0 refills | Status: DC

## 2021-10-17 ENCOUNTER — Other Ambulatory Visit: Payer: Self-pay | Admitting: Medical

## 2021-10-22 ENCOUNTER — Telehealth: Payer: Self-pay | Admitting: Neurology

## 2021-10-22 NOTE — Telephone Encounter (Signed)
Called and spoke with pt. Advised it would be best for her to contact PCP/orthopaedics about this question. They would be able to better direct her on this. She verbalized understanding and appreciation.

## 2021-10-22 NOTE — Telephone Encounter (Signed)
Pt called wanting to know if the Collar Guard is recommended to be used for her Frozen Shoulder. Please advise.

## 2021-11-01 ENCOUNTER — Other Ambulatory Visit: Payer: Self-pay | Admitting: Medical

## 2021-11-01 ENCOUNTER — Other Ambulatory Visit: Payer: Self-pay | Admitting: Neurology

## 2021-11-11 NOTE — Progress Notes (Signed)
PATIENT: Teresa Lawrence DOB: Jun 20, 1966  REASON FOR VISIT: Follow up for seizures, mood disorder, RLS HISTORY FROM: Patient PRIMARY NEUROLOGIST: Dr. Brett Fairy   HISTORY OF PRESENT ILLNESS: Today 11/12/21 Teresa Lawrence is here today for follow-up. No seizures. Ordered EEG, HST last visit, didn't have it done. She lives with her mom and 2 cats. No seizures. Sees psychiatry. On Vraylar, things are more level. Bilateral frozen shoulder, seeing orthopedics. No falls. Her mom fixes her pill box. The following medications from our office:  Gabapentin 100 mg twice daily (she doesn't know why)  Klonopin 2 mg AM, 2 mg midday, 4 mg at bedtime for mood swings  Belsomra 20 mg nightly for sleep  Mirapex 0.25 mg AM, 0.25 afternoon, 0.5 mg bedtime  Phenobarbital 64.8 mg twice daily   HISTORY  Copied Dr. Brett Fairy note 06/16/21: Teresa Lawrence is a 56 y.o. Caucasian female patient of Dr.  Jannifer Franklin is seen here to transfer Epilepsy care- seen here on 06/16/2021  Chief concern according to patient :  " I have Epilepsy"  No seizures in years - patient here alone and not sure about time frame. She has massive lymphoedema.    I have the pleasure of seeing Teresa Lawrence today, a right-handed  Caucasian female with a possible sleep disorder.  She   has a past medical history of Abnormal Pap smear, Anxiety, Bipolar disorder ([place]), Dysmenorrhea, Dysplasia, Dysrhythmia, Dysuria, H/O head injury, Headache(784.0), High blood cholesterol, HSV-2 infection, hypercholesterolemia, ovarian cyst, seizure disorder, Mass of ovary, Menorrhagia, Mental retardation, Mild mental retardation, Perimenopausal vasomotor symptoms, PMS (premenstrual syndrome), PMS (premenstrual syndrome), PMS (premenstrual syndrome), RLS (restless legs syndrome), Seizures ([place]), Uterine mass, Vaginal yeast infection (2007), and Yeast vaginitis.   The patient is suffering from OBESITY, MOOD SWINGS, Bipolar- MILD MRDD> she was taken off Seroquel by her psychiatrist,  and will not need insomnia medication form here.  She has been    Sleep relevant medical history: Nocturia , Insomnia ,sleeps in a recliner- lymphedema, frozen shoulders.  father passed away from CAD.   Social history:  Patient is disabled,  and lives in a household with mother  persons/ alone. Family status is single, Tobacco use/.  ETOH use /, Caffeine intake in form of Coffee( 1 cup in AM ) Soda( Dr Pepper/ 2-3 week) ) Tea ( /) or energy drinks. Regular exercise- none      Sleep habits are as follows: The patient's dinner time is between 5-6 PM. The patient goes to bed at 12 PM and continues to sleep for 6-8 hours, wakes for many bathroom breaks, the first time at 2 AM.   The preferred sleep position is in a recliner - Dreams are reportedly rare.  6.45  AM is the usual rise time. She reports not feeling refreshed or restored in AM, with symptoms such as dry mouth, morning headaches, and residual fatigue. Groggy.   Naps are taken frequently, lasting from 1-2 hours.   REVIEW OF SYSTEMS: Out of a complete 14 system review of symptoms, the patient complains only of the following symptoms, and all other reviewed systems are negative.  See HPI  ALLERGIES: Allergies  Allergen Reactions   Codeine Nausea And Vomiting   Requip [Ropinirole Hcl] Nausea And Vomiting   Topamax [Topiramate] Nausea Only   Zonegran [Zonisamide] Nausea Only    HOME MEDICATIONS: Outpatient Medications Prior to Visit  Medication Sig Dispense Refill   acyclovir (ZOVIRAX) 200 MG capsule Take 200 mg by mouth 3 (three) times  daily as needed (fever blisters).      atorvastatin (LIPITOR) 40 MG tablet Take 1 tablet by mouth once daily 90 tablet 0   Cariprazine HCl (VRAYLAR PO) Take by mouth.     clonazePAM (KLONOPIN) 2 MG tablet TAKE 1 TABLET BY MOUTH ONCE DAILY IN THE MORNING AND 1 AT NOON AND 2 AT BEDTIME 120 tablet 2   fluticasone (FLONASE) 50 MCG/ACT nasal spray Place 1 spray into both nostrils daily. 16 g 0    gabapentin (NEURONTIN) 100 MG capsule Take 1 capsule (100 mg total) by mouth 2 (two) times daily. 180 capsule 0   hydrocortisone cream 1 % Apply 1 application topically as needed (itching).     ibuprofen (ADVIL,MOTRIN) 600 MG tablet Take 1 tablet (600 mg total) by mouth every 6 (six) hours as needed (mild pain). 30 tablet 1   mupirocin ointment (BACTROBAN) 2 % Apply 1 application topically 2 (two) times daily. 22 g 0   pantoprazole (PROTONIX) 40 MG tablet Take 40 mg by mouth 2 (two) times daily as needed (for reflux symptoms).      PHENobarbital (LUMINAL) 64.8 MG tablet Take 1 tablet (64.8 mg total) by mouth 2 (two) times daily. 180 tablet 1   pramipexole (MIRAPEX) 0.25 MG tablet 1 in Am, 1 in Pm and 2 at night po. 360 tablet 2   promethazine (PHENERGAN) 25 MG tablet Take 25 mg by mouth every 6 (six) hours as needed for nausea or vomiting.     Suvorexant (BELSOMRA) 20 MG TABS Take 20 mg by mouth at bedtime as needed. 30 tablet 5   tamsulosin (FLOMAX) 0.4 MG CAPS capsule Take 1 capsule (0.4 mg total) by mouth daily after supper. 30 capsule 1   VRAYLAR 4.5 MG CAPS Take 1 capsule by mouth at bedtime.     No facility-administered medications prior to visit.    PAST MEDICAL HISTORY: Past Medical History:  Diagnosis Date   Abnormal Pap smear    Anxiety    Bipolar disorder ([place])    Dysmenorrhea    Dysplasia    Dysrhythmia    episodes of palpitations   Dysuria    H/O head injury    Headache(784.0)    High blood cholesterol    HSV-2 infection    Hx of hypercholesterolemia    Hx of ovarian cyst    Hx of seizure disorder    Mass of ovary    left side   Menorrhagia    Mental retardation    mild per Dr. notes in EPIC   Mild mental retardation    Perimenopausal vasomotor symptoms    PMS (premenstrual syndrome)    PMS (premenstrual syndrome)    PMS (premenstrual syndrome)    RLS (restless legs syndrome)    Seizures ([place])    Uterine mass    Vaginal yeast infection 2007   recurrent     Yeast vaginitis     PAST SURGICAL HISTORY: Past Surgical History:  Procedure Laterality Date   ABDOMINAL HYSTERECTOMY  2013   COLPOSCOPY     CYSTOSCOPY Bilateral 05/30/2013   Procedure: CYSTOSCOPY;  Surgeon: Betsy Coder, MD;  Location: [facility];  Service: Gynecology;  Laterality: Bilateral;   DILATION AND CURETTAGE OF UTERUS     ENDOMETRIAL BIOPSY     HYSTEROSCOPY W/ ENDOMETRIAL ABLATION     LAPAROSCOPIC HYSTERECTOMY Right 05/30/2013   Procedure: HYSTERECTOMY TOTAL LAPAROSCOPIC;  Surgeon: Betsy Coder, MD;  Location: [facility];  Service: Gynecology;  Laterality: Right;  Right Salpingectomy   POLYPECTOMY     WISDOM TOOTH EXTRACTION      FAMILY HISTORY: Family History  Problem Relation Age of Onset   Heart attack Father    Breast cancer Sister     SOCIAL HISTORY: Social History   Socioeconomic History   Marital status: Single    Spouse name: Not on file   Number of children: 0   Years of education: HS   Highest education level: Not on file  Occupational History   Occupation: Unemployed  Tobacco Use   Smoking status: Former   Smokeless tobacco: Never   Tobacco comments:    Quit 10 years ago.  Vaping Use   Vaping Use: Never used  Substance and Sexual Activity   Alcohol use: No    Alcohol/week: 0.0 standard drinks   Drug use: No   Sexual activity: Not Currently  Other Topics Concern   Not on file  Social History Narrative   Patient lives with her mom and two cats.   Disabled   Education high school.   Right handed.      Social Determinants of Health   Financial Resource Strain: Not on file  Food Insecurity: Not on file  Transportation Needs: Not on file  Physical Activity: Not on file  Stress: Not on file  Social Connections: Not on file  Intimate Partner Violence: Not on file      PHYSICAL EXAM  Vitals:   11/12/21 1358  BP: 126/84  Pulse: 74  SpO2: 98%  Weight: 267 lb (121.1 kg)  Height: 5\' 3"  (1.6 m)   Body mass index is 47.3  kg/m.  Generalized: Well developed, in no acute distress   Neurological examination  Mentation: Alert oriented to time, place, history taking. Follows all commands, speech is slow, soft, cognitively slow Cranial nerve II-XII: Pupils were equal round reactive to light. Extraocular movements were full, visual field were full on confrontational test. Facial sensation and strength were normal. Head turning and shoulder shrug  were normal and symmetric. Motor: Good strength all extremities, limited ROM of shoulders, moves slow in general Sensory: Sensory testing is intact to soft touch on all 4 extremities. No evidence of extinction is noted.  Coordination: Cerebellar testing reveals good finger-nose-finger and heel-to-shin bilaterally.  Gait and station: Gait is wide-based. Reflexes: Deep tendon reflexes are symmetric and normal bilaterally.   DIAGNOSTIC DATA (LABS, IMAGING, TESTING) - I reviewed patient records, labs, notes, testing and imaging myself where available.  Lab Results  Component Value Date   WBC 7.4 05/27/2020   HGB 12.5 05/27/2020   HCT 39.7 05/27/2020   MCV 92 05/27/2020   PLT 296 05/27/2020      Component Value Date/Time   NA 142 12/27/2020 0953   NA 145 (H) 11/21/2020 1200   K 4.4 12/27/2020 0953   CL 103 12/27/2020 0953   CO2 31 12/27/2020 0953   GLUCOSE 85 12/27/2020 0953   BUN 11 12/27/2020 0953   BUN 10 11/21/2020 1200   CREATININE 0.74 12/27/2020 0953   CALCIUM 9.4 12/27/2020 0953   PROT 7.6 12/27/2020 0953   PROT 7.6 11/21/2020 1200   ALBUMIN 4.0 12/27/2020 0953   ALBUMIN 4.0 11/21/2020 1200   AST 14 12/27/2020 0953   ALT 14 12/27/2020 0953   ALKPHOS 128 (H) 12/27/2020 0953   BILITOT 0.4 12/27/2020 0953   BILITOT 0.3 11/21/2020 1200   GFRNONAA 99 11/21/2020 1200   GFRAA 115 11/21/2020 1200   Lab Results  Component  Value Date   CHOL 307 (H) 12/27/2020   HDL 44.20 12/27/2020   LDLCALC 247 (H) 12/27/2020   TRIG 80.0 12/27/2020   CHOLHDL 7  12/27/2020   No results found for: HGBA1C Lab Results  Component Value Date   VITAMINB12 405 11/21/2020   Lab Results  Component Value Date   TSH 2.470 11/21/2020   ASSESSMENT AND PLAN 56 y.o. year old female  has a past medical history of Abnormal Pap smear, Anxiety, Bipolar disorder ([place]), Dysmenorrhea, Dysplasia, Dysrhythmia, Dysuria, H/O head injury, Headache(784.0), High blood cholesterol, HSV-2 infection, hypercholesterolemia, ovarian cyst, seizure disorder, Mass of ovary, Menorrhagia, Mental retardation, Mild mental retardation, Perimenopausal vasomotor symptoms, PMS (premenstrual syndrome), PMS (premenstrual syndrome), PMS (premenstrual syndrome), RLS (restless legs syndrome), Seizures ([place]), Uterine mass, Vaginal yeast infection (2007), and Yeast vaginitis. here with:  1.  Seizure disorder 2.  Bipolar disorder 3.  Restless leg syndrome 4.  Gait disorder  -Will again pursue EEG, HST ordered by Dr. Brett Fairy at last visit -Will try to wean off gabapentin, isn't sure why she is taking, take 100 mg daily for 1 week, then stop -For now, we will continue other medications including Belsomra, phenobarbital, clonazepam, Mirapex -Check routine labs today on phenobarbital -Continue close follow-up with psychiatry, follow-up in our office in 6 months or sooner if needed  Teresa Dakin, DNP 11/12/2021, 2:19 PM [facility] [address] [place], [place] [address] [phone]

## 2021-11-12 ENCOUNTER — Encounter: Payer: Self-pay | Admitting: Neurology

## 2021-11-12 ENCOUNTER — Ambulatory Visit (INDEPENDENT_AMBULATORY_CARE_PROVIDER_SITE_OTHER): Payer: Medicare (Managed Care) | Admitting: Neurology

## 2021-11-12 ENCOUNTER — Telehealth: Payer: Self-pay | Admitting: Neurology

## 2021-11-12 VITALS — BP 126/84 | HR 74 | Ht 63.0 in | Wt 267.0 lb

## 2021-11-12 DIAGNOSIS — G2581 Restless legs syndrome: Secondary | ICD-10-CM | POA: Diagnosis not present

## 2021-11-12 DIAGNOSIS — G40909 Epilepsy, unspecified, not intractable, without status epilepticus: Secondary | ICD-10-CM

## 2021-11-12 DIAGNOSIS — F319 Bipolar disorder, unspecified: Secondary | ICD-10-CM

## 2021-11-12 DIAGNOSIS — Z8669 Personal history of other diseases of the nervous system and sense organs: Secondary | ICD-10-CM | POA: Diagnosis not present

## 2021-11-12 NOTE — Telephone Encounter (Signed)
Can we please check on status of HST ordered by Dr. Brett Fairy at last visit?

## 2021-11-12 NOTE — Patient Instructions (Signed)
Check labs today  Cut back on gabapentin 1 capsule for 1 week then stop  Keep other meds for now Check EEG  Check sleep study  See you back in 6 months

## 2021-11-13 ENCOUNTER — Other Ambulatory Visit: Payer: Self-pay

## 2021-11-13 ENCOUNTER — Telehealth: Payer: Self-pay | Admitting: *Deleted

## 2021-11-13 ENCOUNTER — Ambulatory Visit
Admission: RE | Admit: 2021-11-13 | Discharge: 2021-11-13 | Disposition: A | Discharge status: Home / Self Care | Payer: Medicare (Managed Care) | Source: Ambulatory Visit | Attending: Nurse Practitioner | Admitting: Nurse Practitioner

## 2021-11-13 VITALS — BP 137/89 | HR 86 | Temp 98.8°F | Ht 63.0 in | Wt 267.0 lb

## 2021-11-13 DIAGNOSIS — R300 Dysuria: Secondary | ICD-10-CM

## 2021-11-13 LAB — COMPREHENSIVE METABOLIC PANEL
ALT: 21 IU/L (ref 0–32)
AST: 15 IU/L (ref 0–40)
Albumin/Globulin Ratio: 1.1 — ABNORMAL LOW (ref 1.2–2.2)
Albumin: 4.1 g/dL (ref 3.8–4.9)
Alkaline Phosphatase: 152 IU/L — ABNORMAL HIGH (ref 44–121)
BUN/Creatinine Ratio: 16 (ref 9–23)
BUN: 11 mg/dL (ref 6–24)
Bilirubin Total: <0.2 mg/dL (ref 0.0–1.2)
CO2: 27 mmol/L (ref 20–29)
Calcium: 9.6 mg/dL (ref 8.7–10.2)
Chloride: 103 mmol/L (ref 96–106)
Creatinine, Ser: 0.67 mg/dL (ref 0.57–1.00)
Globulin, Total: 3.6 g/dL (ref 1.5–4.5)
Glucose: 85 mg/dL (ref 70–99)
Potassium: 4.6 mmol/L (ref 3.5–5.2)
Sodium: 144 mmol/L (ref 134–144)
Total Protein: 7.7 g/dL (ref 6.0–8.5)
eGFR: 103 mL/min/{1.73_m2} (ref >59)

## 2021-11-13 LAB — PHENOBARBITAL LEVEL: Phenobarbital, Serum: 25 ug/mL (ref 15–40)

## 2021-11-13 LAB — CBC WITH DIFFERENTIAL/PLATELET
Basophils Absolute: 0.1 10*3/uL (ref 0.0–0.2)
Basos: 1 %
EOS (ABSOLUTE): 0.3 10*3/uL (ref 0.0–0.4)
Eos: 4 %
Hematocrit: 39 % (ref 34.0–46.6)
Hemoglobin: 12.5 g/dL (ref 11.1–15.9)
Immature Grans (Abs): 0 10*3/uL (ref 0.0–0.1)
Immature Granulocytes: 0 %
Lymphocytes Absolute: 2.3 10*3/uL (ref 0.7–3.1)
Lymphs: 28 %
MCH: 28.8 pg (ref 26.6–33.0)
MCHC: 32.1 g/dL (ref 31.5–35.7)
MCV: 90 fL (ref 79–97)
Monocytes Absolute: 0.6 10*3/uL (ref 0.1–0.9)
Monocytes: 7 %
Neutrophils Absolute: 5 10*3/uL (ref 1.4–7.0)
Neutrophils: 60 %
Platelets: 333 10*3/uL (ref 150–450)
RBC: 4.34 x10E6/uL (ref 3.77–5.28)
RDW: 12.7 % (ref 11.7–15.4)
WBC: 8.3 10*3/uL (ref 3.4–10.8)

## 2021-11-13 LAB — POCT URINALYSIS DIP (MANUAL ENTRY)
Bilirubin, UA: NEGATIVE
Glucose, UA: NEGATIVE mg/dL
Ketones, POC UA: NEGATIVE mg/dL
Nitrite, UA: NEGATIVE
Protein Ur, POC: 100 mg/dL — AB
Spec Grav, UA: 1.025 (ref 1.010–1.025)
Urobilinogen, UA: 0.2 E.U./dL
pH, UA: 6.5 (ref 5.0–8.0)

## 2021-11-13 MED ORDER — NITROFURANTOIN MONOHYD MACRO 100 MG PO CAPS: 100.0000 mg | ORAL_CAPSULE | Freq: Two times a day (BID) | ORAL | 0 refills | Status: DC

## 2021-11-13 MED ORDER — PHENAZOPYRIDINE HCL 200 MG PO TABS: 200.0000 mg | ORAL_TABLET | Freq: Three times a day (TID) | ORAL | 0 refills | Status: DC

## 2021-11-13 NOTE — Telephone Encounter (Signed)
Pt verified by name and DOB,  normal results given per provider, pt voiced understanding all question answered. °

## 2021-11-13 NOTE — ED Triage Notes (Signed)
Patient c/o dysuria, urgency and frequency for over one week.  Patient denies any hematuria.

## 2021-11-13 NOTE — Telephone Encounter (Signed)
-----   Message from [name], NP sent at 11/13/2021  7:42 AM EST ----- Please call the patient, labs are stable, no significant abnormalities. Phenobarbital is within range. Thanks.

## 2021-11-13 NOTE — ED Provider Notes (Signed)
[facility]    CSN: [id number] Arrival date & time: 11/13/21  1443      History   Chief Complaint Chief Complaint  Patient presents with   Appointment    Dysuria     HPI Teresa Lawrence is a 56 y.o. female.   Subjective:  Teresa Lawrence is a 56 y.o. female who complains of dysuria, frequency, and urgency for 1 week. Patient denies fever, vaginal discharge, abdominal pain, nausea, vomiting, back pain or flank pain.  Patient does not have a history of recurrent UTI.  Patient does not have a history of pyelonephritis. Patient does not have menses as she is s/p partial hysterectomy and denies any suspicion for STIs.   The following portions of the patient's history were reviewed and updated as appropriate: allergies, current medications, past family history, past medical history, past social history, past surgical history, and problem list.    Past Medical History:  Diagnosis Date   Abnormal Pap smear    Anxiety    Bipolar disorder ([place])    Dysmenorrhea    Dysplasia    Dysrhythmia    episodes of palpitations   Dysuria    H/O head injury    Headache(784.0)    High blood cholesterol    HSV-2 infection    Hx of hypercholesterolemia    Hx of ovarian cyst    Hx of seizure disorder    Mass of ovary    left side   Menorrhagia    Mental retardation    mild per Dr. 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   RLS (restless legs syndrome)    Acute cystitis with hematuria 05/09/2020   Nondisplaced fracture of lateral malleolus of  left fibula, initial encounter for closed fracture 10/25/2017   Insomnia 03/17/2013   Headache 02/23/2013   Hx of yeast infection 04/20/2012   Hx of menorrhagia 04/20/2012   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   Bipolar disorder ([place]) 04/20/2012   Hx of menorrhagia 04/20/2012   Hx of ovarian cyst 04/20/2012   Epilepsy ([place]) 04/20/2012   Hx of seizure disorder 04/20/2012   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   Hx of dysmenorrhea 04/20/2012   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   Hx of hypercholesterolemia 04/20/2012   Hx of PMS (premenstrual syndrome) 04/20/2012   Encounter for therapeutic drug monitoring 05/01/2011    Past Surgical History:  Procedure Laterality Date   ABDOMINAL HYSTERECTOMY  2013   COLPOSCOPY     CYSTOSCOPY Bilateral 05/30/2013   Procedure: CYSTOSCOPY;  Surgeon: Betsy Coder, MD;  Location: [facility];  Service: Gynecology;  Laterality: Bilateral;   DILATION AND CURETTAGE OF UTERUS     ENDOMETRIAL BIOPSY     HYSTEROSCOPY W/ ENDOMETRIAL ABLATION     LAPAROSCOPIC HYSTERECTOMY Right 05/30/2013   Procedure: HYSTERECTOMY TOTAL LAPAROSCOPIC;  Surgeon: Betsy Coder, MD;  Location: [facility];  Service: Gynecology;  Laterality: Right;  Right Salpingectomy   POLYPECTOMY     WISDOM TOOTH  EXTRACTION      OB History   No obstetric history on file.      Home Medications    Prior to Admission medications   Medication Sig Start Date End Date Taking? Authorizing Provider  acyclovir (ZOVIRAX) 200 MG capsule Take 200 mg by mouth 3 (three) times daily as needed (fever blisters).    Yes [provider]  atorvastatin (LIPITOR) 40 MG tablet Take 1 tablet by mouth once daily 11/03/21  Yes Saguier, Percell Miller, PA-C  Cariprazine HCl (VRAYLAR PO) Take by mouth.   Yes [provider]  clonazePAM (KLONOPIN) 2 MG tablet TAKE 1 TABLET BY MOUTH ONCE DAILY IN THE MORNING AND 1 AT NOON AND 2 AT BEDTIME 10/06/21  Yes Dohmeier, Asencion Partridge, MD  fluticasone (FLONASE) 50  MCG/ACT nasal spray Place 1 spray into both nostrils daily. 02/20/20  Yes [name], [name], PA-C  hydrocortisone cream 1 % Apply 1 application topically as needed (itching).   Yes [provider]  ibuprofen (ADVIL,MOTRIN) 600 MG tablet Take 1 tablet (600 mg total) by mouth every 6 (six) hours as needed (mild pain). 05/31/13  Yes Earnstine Regal, PA-C  mupirocin ointment (BACTROBAN) 2 % Apply 1 application topically 2 (two) times daily. 01/27/21  Yes Saguier, Percell Miller, PA-C  nitrofurantoin, macrocrystal-monohydrate, (MACROBID) 100 MG capsule Take 1 capsule (100 mg total) by mouth 2 (two) times daily. 11/13/21  Yes Enrique Sack, FNP  pantoprazole (PROTONIX) 40 MG tablet Take 40 mg by mouth 2 (two) times daily as needed (for reflux symptoms).    Yes [provider]  phenazopyridine (PYRIDIUM) 200 MG tablet Take 1 tablet (200 mg total) by mouth 3 (three) times daily. 11/13/21  Yes Enrique Sack, FNP  PHENobarbital (LUMINAL) 64.8 MG tablet Take 1 tablet (64.8 mg total) by mouth 2 (two) times daily. 06/16/21  Yes Dohmeier, Asencion Partridge, MD  pramipexole (MIRAPEX) 0.25 MG tablet 1 in Am, 1 in Pm and 2 at night po. 06/16/21  Yes Dohmeier, Asencion Partridge, MD  promethazine (PHENERGAN) 25 MG tablet Take 25 mg by mouth every 6 (six) hours as needed for nausea or vomiting.   Yes [provider]  Suvorexant (BELSOMRA) 20 MG TABS Take 20 mg by mouth at bedtime as needed. 06/24/21  Yes [name], MD  tamsulosin (FLOMAX) 0.4 MG CAPS capsule Take 1 capsule (0.4 mg total) by mouth daily after supper. 08/29/21  Yes Lamptey, Myrene Galas, MD  VRAYLAR 4.5 MG CAPS Take 1 capsule by mouth at bedtime. 11/09/20  Yes [provider]    Family History Family History  Problem Relation Age of Onset   Heart attack Father    Breast cancer Sister     Social History Social History   Tobacco Use   Smoking status: Former   Smokeless tobacco: Never   Tobacco comments:    Quit 10 years ago.  Vaping  Use   Vaping Use: Never used  Substance Use Topics   Alcohol use: No    Alcohol/week: 0.0 standard drinks   Drug use: No     Allergies   Codeine, Requip [ropinirole hcl], Topamax [topiramate], and Zonegran [zonisamide]   Review of Systems Review of Systems  Constitutional:  Negative for fever.  Gastrointestinal:  Negative for nausea and vomiting.  Genitourinary:  Positive for dysuria and frequency. Negative for flank pain and vaginal discharge.  Musculoskeletal:  Negative for back pain.  All other systems reviewed and are negative.   Physical Exam Triage Vital Signs ED Triage Vitals [11/13/21 1519]  Enc Vitals Group     BP 137/89     Pulse Rate 86     Resp      Temp 98.8 F (37.1 C)     Temp Source Oral     SpO2 90 %     Weight 266 lb 15.6 oz (121.1 kg)     Height 5\' 3"  (1.6 m)     Head Circumference      Peak Flow      Pain Score 3     Pain Loc      Pain Edu?      Excl. in [place]?    No data found.  Updated Vital Signs BP 137/89 (BP Location: Left Arm)    Pulse 86    Temp 98.8 F (37.1 C) (Oral)    Ht 5\' 3"  (1.6 m)    Wt 266 lb 15.6 oz (121.1 kg)    LMP 05/11/2013 (LMP Unknown)    SpO2 90%    BMI 47.29 kg/m   Visual Acuity Right Eye Distance:   Left Eye Distance:   Bilateral Distance:    Right Eye Near:   Left Eye Near:    Bilateral Near:     Physical Exam Vitals reviewed.  Constitutional:      General: She is not in acute distress.    Appearance: Normal appearance. She is not ill-appearing or toxic-appearing.  HENT:     Head: Normocephalic.  Cardiovascular:     Rate and Rhythm: Normal rate.  Pulmonary:     Effort: Pulmonary effort is normal.  Abdominal:     Palpations: Abdomen is soft.     Tenderness: There is no right CVA tenderness or left CVA tenderness.  Musculoskeletal:        General: Normal range of motion.     Cervical back: Normal range of motion and neck supple.  Skin:    General: Skin is warm and dry.  Neurological:     General:  No focal deficit present.     Mental Status: She is alert and oriented to person, place, and time.  Psychiatric:        Mood and Affect: Mood normal.        Behavior: Behavior normal.     UC Treatments / Results  Labs (all labs ordered are listed, but only abnormal results are displayed) Labs Reviewed  POCT URINALYSIS DIP (MANUAL ENTRY) - Abnormal; Notable for the following components:      Result Value   Blood, UA moderate (*)    Protein Ur, POC =100 (*)    Leukocytes, UA Trace (*)    All other components within normal limits    EKG   Radiology No results found.  Procedures Procedures (including critical care time)  Medications Ordered in UC Medications - No data to display  Initial Impression / Assessment and Plan / UC Course  I have reviewed the triage vital signs and the nursing notes.  Pertinent labs & imaging results that were available during my care of the patient were reviewed by me and considered in my medical decision making (see chart for details).    56 year old female presenting with a 1 week history of dysuria, urinary frequency and urgency.  No fevers, vaginal discharge, nausea, vomiting, back pain or flank pain.  She is afebrile and nontoxic.  Urinalysis shows trace leukocytes with moderate blood.  No nitrites.  Urine culture is currently pending.  Will treat with Macrobid and Pyridium for now. Advised patient  to maintain adequate hydration.   Today's evaluation has revealed no signs of a dangerous process. Discussed diagnosis with patient and/or guardian. Patient and/or guardian aware of their diagnosis, possible red flag symptoms to watch out for and need for close follow up. Patient and/or guardian understands verbal and written discharge instructions. Patient and/or guardian comfortable with plan and disposition.  Patient and/or guardian has a clear mental status at this time, good insight into illness (after discussion and teaching) and has clear judgment  to make decisions regarding their care  This care was provided during an unprecedented National Emergency due to the Novel Coronavirus (COVID-19) pandemic. COVID-19 infections and transmission risks place heavy strains on healthcare resources.  As this pandemic evolves, our facility, providers, and staff strive to respond fluidly, to remain operational, and to provide care relative to available resources and information. Outcomes are unpredictable and treatments are without well-defined guidelines. Further, the impact of COVID-19 on all aspects of urgent care, including the impact to patients seeking care for reasons other than COVID-19, is unavoidable during this national emergency. At this time of the global pandemic, management of patients has significantly changed, even for non-COVID positive patients given high local and regional COVID volumes at this time requiring high healthcare system and resource utilization. The standard of care for management of both COVID suspected and non-COVID suspected patients continues to change rapidly at the local, regional, national, and global levels. This patient was worked up and treated to the best available but ever changing evidence and resources available at this current time.   Documentation was completed with the aid of voice recognition software. Transcription may contain typographical errors.  Final Clinical Impressions(s) / UC Diagnoses   Final diagnoses:  Dysuria     Discharge Instructions      Your urine test did not show any signs of infection. I have sent it off for cultures for a more detailed look. For now, we will treat with antibiotics and medication for the pain. Drink enough fluid to keep your urine pale yellow. Avoid caffeinated beverages, tea, and alcohol. These beverages can irritate the bladder and make dysuria worse.     ED Prescriptions     Medication Sig Dispense Auth. Provider   nitrofurantoin, macrocrystal-monohydrate,  (MACROBID) 100 MG capsule Take 1 capsule (100 mg total) by mouth 2 (two) times daily. 10 capsule Enrique Sack, FNP   phenazopyridine (PYRIDIUM) 200 MG tablet Take 1 tablet (200 mg total) by mouth 3 (three) times daily. 6 tablet Enrique Sack, FNP      PDMP not reviewed this encounter.   Enrique Sack, [place] 11/13/21 1534

## 2021-11-13 NOTE — Discharge Instructions (Signed)
Your urine test did not show any signs of infection. I have sent it off for cultures for a more detailed look. For now, we will treat with antibiotics and medication for the pain. Drink enough fluid to keep your urine pale yellow. Avoid caffeinated beverages, tea, and alcohol. These beverages can irritate the bladder and make dysuria worse.

## 2021-11-18 ENCOUNTER — Other Ambulatory Visit: Payer: Self-pay

## 2021-11-18 ENCOUNTER — Ambulatory Visit (INDEPENDENT_AMBULATORY_CARE_PROVIDER_SITE_OTHER): Payer: Medicare (Managed Care) | Admitting: Neurology

## 2021-11-18 DIAGNOSIS — G40909 Epilepsy, unspecified, not intractable, without status epilepticus: Secondary | ICD-10-CM

## 2021-11-18 DIAGNOSIS — Z8669 Personal history of other diseases of the nervous system and sense organs: Secondary | ICD-10-CM

## 2021-11-19 NOTE — Procedures (Signed)
° ° °  History:  69 yof with seizures   EEG classification: Awake and drowsy  Description of the recording: The background rhythms of this recording consists of a beta activity. As the record progresses, the patient appears to remain in the waking state throughout the recording. Photic stimulation was performed, did not show any abnormalities. Hyperventilation was also performed, did not show any abnormalities. Toward the end of the recording, the patient enters the drowsy state with slight symmetric slowing seen. The patient never enters stage II sleep. No abnormal epileptiform discharges seen during this recording. There was intermittent mild diffuse slowing. EKG monitor shows no evidence of cardiac rhythm abnormalities with a heart rate of 78.  Impression: This is an abnormal EEG recording in the waking and drowsy state due to mild diffuse slowing. 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 The beta activity seen is likely secondary to medication such as benzodiazepine. Patient is on Klonopin TID. No seizures or epileptiform discharges seen during this recording.   Alric Ran, MD [facility]

## 2021-11-20 ENCOUNTER — Telehealth: Payer: Self-pay | Admitting: *Deleted

## 2021-11-20 NOTE — Telephone Encounter (Signed)
I spoke to patient's mother, Clotee Schlicker. She is aware of the information below. Agreeable to move forward with any recommended changes.

## 2021-11-20 NOTE — Telephone Encounter (Signed)
-----   Message from [name], NP sent at 11/20/2021  4:09 PM EST ----- Please call the patient. EEG does not show any seizures. There is slowing seen, could be related to benzo use. I have printed out the EEG to discuss with Dr. Brett Fairy when she returns about adjusting/reducing Klonopin that she has been on long term. Sleep study should also be in process, last I heard Meagan was working on getting approval.   Impression: This is an abnormal EEG recording in the waking and drowsy state due to mild diffuse slowing. 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 The beta activity seen is likely secondary to medication such as benzodiazepine. Patient is on Klonopin TID. No seizures or epileptiform discharges seen during this recording.

## 2021-11-23 ENCOUNTER — Other Ambulatory Visit: Payer: Self-pay

## 2021-11-23 ENCOUNTER — Ambulatory Visit
Admission: RE | Admit: 2021-11-23 | Discharge: 2021-11-23 | Disposition: A | Discharge status: Home / Self Care | Payer: Medicare (Managed Care) | Source: Ambulatory Visit | Attending: Physician Assistant | Admitting: Physician Assistant

## 2021-11-23 VITALS — BP 152/83 | HR 85 | Temp 98.9°F | Resp 18

## 2021-11-23 DIAGNOSIS — R300 Dysuria: Secondary | ICD-10-CM | POA: Diagnosis present

## 2021-11-23 LAB — POCT URINALYSIS DIP (MANUAL ENTRY)
Bilirubin, UA: NEGATIVE
Glucose, UA: NEGATIVE mg/dL
Ketones, POC UA: NEGATIVE mg/dL
Leukocytes, UA: NEGATIVE
Nitrite, UA: NEGATIVE
Protein Ur, POC: NEGATIVE mg/dL
Spec Grav, UA: 1.015 (ref 1.010–1.025)
Urobilinogen, UA: 0.2 E.U./dL
pH, UA: 7 (ref 5.0–8.0)

## 2021-11-23 NOTE — ED Provider Notes (Signed)
[name]    CSN: [id number] Arrival date & time: 11/23/21  1345      History   Chief Complaint Chief Complaint  Patient presents with   Appointment    1400   Dysuria    HPI Teresa Lawrence is a 56 y.o. female.   Patient here today for evaluation of return of urinary frequency and dysuria. She reports that she was seen for same recently and there were issues with her culture so she came to have that repeated. She has not had fever.   The history is provided by the patient.   Past Medical History:  Diagnosis Date   Abnormal Pap smear    Anxiety    Bipolar disorder ([place])    Dysmenorrhea    Dysplasia    Dysrhythmia    episodes of palpitations   Dysuria    H/O head injury    Headache(784.0)    High blood cholesterol    HSV-2 infection    Hx of hypercholesterolemia    Hx of ovarian cyst    Hx of seizure disorder    Mass of ovary    left side   Menorrhagia    Mental retardation    mild per Dr. 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   RLS (restless legs syndrome)    Acute cystitis with hematuria 05/09/2020   Nondisplaced fracture of lateral malleolus of left fibula, initial encounter for closed fracture 10/25/2017   Insomnia 03/17/2013   Headache 02/23/2013   Hx of yeast infection 04/20/2012   Hx of menorrhagia 04/20/2012   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   Bipolar disorder ([place]) 04/20/2012   Hx of menorrhagia 04/20/2012   Hx of ovarian cyst 04/20/2012   Epilepsy ([place]) 04/20/2012   Hx of  seizure disorder 04/20/2012   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   Hx of dysmenorrhea 04/20/2012   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   Hx of hypercholesterolemia 04/20/2012   Hx of PMS (premenstrual syndrome) 04/20/2012   Encounter for therapeutic drug monitoring 05/01/2011    Past Surgical History:  Procedure Laterality Date   ABDOMINAL HYSTERECTOMY  2013   COLPOSCOPY     CYSTOSCOPY Bilateral 05/30/2013   Procedure: CYSTOSCOPY;  Surgeon: Betsy Coder, MD;  Location: [facility];  Service: Gynecology;  Laterality: Bilateral;   DILATION AND CURETTAGE OF UTERUS     ENDOMETRIAL BIOPSY     HYSTEROSCOPY W/ ENDOMETRIAL ABLATION     LAPAROSCOPIC HYSTERECTOMY Right 05/30/2013   Procedure: HYSTERECTOMY TOTAL LAPAROSCOPIC;  Surgeon: Betsy Coder, MD;  Location: [facility];  Service: Gynecology;  Laterality: Right;  Right Salpingectomy   POLYPECTOMY     WISDOM TOOTH EXTRACTION      OB History   No obstetric history on file.      Home Medications    Prior to Admission medications   Medication Sig Start Date End Date Taking? Authorizing Provider  acyclovir (ZOVIRAX) 200 MG capsule Take 200 mg by mouth 3 (three)  times daily as needed (fever blisters).     [provider]  atorvastatin (LIPITOR) 40 MG tablet Take 1 tablet by mouth once daily 11/03/21   Saguier, Percell Miller, PA-C  Cariprazine HCl (VRAYLAR PO) Take by mouth.    [provider]  clonazePAM (KLONOPIN) 2 MG tablet TAKE 1 TABLET BY MOUTH ONCE DAILY IN THE MORNING AND 1 AT NOON AND 2 AT BEDTIME 10/06/21   Dohmeier, Asencion Partridge, MD  fluticasone (FLONASE) 50 MCG/ACT nasal spray Place 1 spray into both nostrils daily. 02/20/20   [name], [name], PA-C  hydrocortisone cream 1 % Apply 1 application topically as needed (itching).    [provider]  ibuprofen (ADVIL,MOTRIN) 600 MG tablet Take 1 tablet (600 mg total) by mouth every 6 (six) hours as needed (mild pain). 05/31/13   Earnstine Regal, PA-C  mupirocin ointment  (BACTROBAN) 2 % Apply 1 application topically 2 (two) times daily. 01/27/21   Saguier, Percell Miller, PA-C  nitrofurantoin, macrocrystal-monohydrate, (MACROBID) 100 MG capsule Take 1 capsule (100 mg total) by mouth 2 (two) times daily. 11/13/21   Enrique Sack, FNP  pantoprazole (PROTONIX) 40 MG tablet Take 40 mg by mouth 2 (two) times daily as needed (for reflux symptoms).     [provider]  phenazopyridine (PYRIDIUM) 200 MG tablet Take 1 tablet (200 mg total) by mouth 3 (three) times daily. 11/13/21   Enrique Sack, FNP  PHENobarbital (LUMINAL) 64.8 MG tablet Take 1 tablet (64.8 mg total) by mouth 2 (two) times daily. 06/16/21   Dohmeier, Asencion Partridge, MD  pramipexole (MIRAPEX) 0.25 MG tablet 1 in Am, 1 in Pm and 2 at night po. 06/16/21   Dohmeier, Asencion Partridge, MD  promethazine (PHENERGAN) 25 MG tablet Take 25 mg by mouth every 6 (six) hours as needed for nausea or vomiting.    [provider]  Suvorexant (BELSOMRA) 20 MG TABS Take 20 mg by mouth at bedtime as needed. 06/24/21   [name], MD  tamsulosin (FLOMAX) 0.4 MG CAPS capsule Take 1 capsule (0.4 mg total) by mouth daily after supper. 08/29/21   Lamptey, Myrene Galas, MD  VRAYLAR 4.5 MG CAPS Take 1 capsule by mouth at bedtime. 11/09/20   [provider]    Family History Family History  Problem Relation Age of Onset   Heart attack Father    Breast cancer Sister     Social History Social History   Tobacco Use   Smoking status: Former   Smokeless tobacco: Never   Tobacco comments:    Quit 10 years ago.  Vaping Use   Vaping Use: Never used  Substance Use Topics   Alcohol use: No    Alcohol/week: 0.0 standard drinks   Drug use: No     Allergies   Codeine, Requip [ropinirole hcl], Topamax [topiramate], and Zonegran [zonisamide]   Review of Systems Review of Systems  Constitutional:  Negative for chills and fever.  Eyes:  Negative for discharge and redness.  Respiratory:  Negative for shortness of breath.    Cardiovascular:  Negative for chest pain.  Gastrointestinal:  Negative for abdominal pain, nausea and vomiting.  Genitourinary:  Positive for dysuria and frequency.  Musculoskeletal:  Negative for back pain.    Physical Exam Triage Vital Signs ED Triage Vitals  Enc Vitals Group     BP      Pulse      Resp      Temp      Temp src      SpO2  Weight      Height      Head Circumference      Peak Flow      Pain Score      Pain Loc      Pain Edu?      Excl. in [place]?    No data found.  Updated Vital Signs BP (!) 152/83 (BP Location: Left Arm)    Pulse 85    Temp 98.9 F (37.2 C) (Oral)    Resp 18    LMP 05/11/2013 (LMP Unknown)    SpO2 94%      Physical Exam Vitals and nursing note reviewed.  Constitutional:      General: She is not in acute distress.    Appearance: Normal appearance. She is not ill-appearing.  HENT:     Head: Normocephalic and atraumatic.  Eyes:     Conjunctiva/sclera: Conjunctivae normal.  Cardiovascular:     Rate and Rhythm: Normal rate.  Pulmonary:     Effort: Pulmonary effort is normal.  Neurological:     Mental Status: She is alert.  Psychiatric:        Mood and Affect: Mood normal.        Behavior: Behavior normal.        Thought Content: Thought content normal.     UC Treatments / Results  Labs (all labs ordered are listed, but only abnormal results are displayed) Labs Reviewed  POCT URINALYSIS DIP (MANUAL ENTRY) - Abnormal; Notable for the following components:      Result Value   Clarity, UA hazy (*)    Blood, UA moderate (*)    All other components within normal limits  URINE CULTURE    EKG   Radiology No results found.  Procedures Procedures (including critical care time)  Medications Ordered in UC Medications - No data to display  Initial Impression / Assessment and Plan / UC Course  I have reviewed the triage vital signs and the nursing notes.  Pertinent labs & imaging results that were available during my  care of the patient were reviewed by me and considered in my medical decision making (see chart for details).   UA without signs of infection. Will order urine culture. Recommended follow up with PCP as I suspect she will need to be evaluated by urology if symptoms persist. Patient expresses understanding.    Final Clinical Impressions(s) / UC Diagnoses   Final diagnoses:  Dysuria     Discharge Instructions       Please follow up with primary care provider. You may need referral to a specialist.      ED Prescriptions   None    PDMP not reviewed this encounter.   Francene Finders, PA-C 11/23/21 1452

## 2021-11-23 NOTE — Discharge Instructions (Signed)
°  Please follow up with primary care provider. You may need referral to a specialist.

## 2021-11-23 NOTE — ED Triage Notes (Signed)
Pt here for continued dysuria; pt sts was told needed new sample; pt requests to be seen for sx as well

## 2021-11-24 LAB — URINE CULTURE: Culture: <10000 — AB

## 2021-12-18 ENCOUNTER — Other Ambulatory Visit: Payer: Self-pay | Admitting: Family Medicine

## 2021-12-18 DIAGNOSIS — Z1231 Encounter for screening mammogram for malignant neoplasm of breast: Secondary | ICD-10-CM

## 2021-12-24 ENCOUNTER — Telehealth: Payer: Self-pay | Admitting: Neurology

## 2021-12-24 NOTE — Telephone Encounter (Signed)
I spoke to the patient's mother on DPR. She is in agreement with the plan and will reach out to discuss further refills w/ Dr. Casimiro Needle. ?

## 2021-12-24 NOTE — Telephone Encounter (Signed)
I was able to review with Dr. Brett Fairy plan post-EEG for Klonopin, she would like this to come from psych going forward since reportedly for anxiety. Can you reach out to her mother to discuss this plan? ?

## 2021-12-25 ENCOUNTER — Ambulatory Visit
Admission: RE | Admit: 2021-12-25 | Discharge: 2021-12-25 | Disposition: A | Discharge status: Home / Self Care | Payer: Medicare (Managed Care) | Source: Ambulatory Visit | Attending: Family Medicine | Admitting: Family Medicine

## 2021-12-25 ENCOUNTER — Other Ambulatory Visit: Payer: Self-pay

## 2021-12-25 DIAGNOSIS — Z1231 Encounter for screening mammogram for malignant neoplasm of breast: Secondary | ICD-10-CM

## 2022-01-03 ENCOUNTER — Other Ambulatory Visit: Payer: Self-pay | Admitting: Psychiatry

## 2022-01-03 MED ORDER — BELSOMRA 20 MG PO TABS: 20.0000 mg | ORAL_TABLET | Freq: Every evening | ORAL | 5 refills | Status: DC | PRN

## 2022-01-31 ENCOUNTER — Other Ambulatory Visit: Payer: Self-pay | Admitting: Medical

## 2022-02-02 ENCOUNTER — Other Ambulatory Visit: Payer: Self-pay | Admitting: Neurology

## 2022-02-02 NOTE — Telephone Encounter (Signed)
Last OV was on 11/12/21.  ?Next OV is scheduled for 05/21/22.  ?Last RX was written on 01/04/22 for 120 tabs.  ? ?[place] [facility] Database has been reviewed.  ?

## 2022-02-03 ENCOUNTER — Telehealth: Payer: Self-pay | Admitting: Neurology

## 2022-02-03 NOTE — Telephone Encounter (Signed)
Per phone note on 12/24/21 from Butler Denmark, NP: ?I was able to review with Dr. Brett Fairy plan post-EEG for Klonopin, she would like this to come from psych going forward since reportedly for anxiety. Can you reach out to her mother to discuss this plan?  ?  ?The prescription that was sent 02/02/22 from our office has been voided (spoke to pharmacist, Herbie Baltimore, at [place]). ? ?Dr. Casimiro Needle is now prescribing her temazepam '15mg'$ , one tab QHS.  ? ?

## 2022-02-03 NOTE — Telephone Encounter (Signed)
[facility] called needing clarification on the pt's clonazePAM (KLONOPIN) 2 MG tablet prescription. Is this medication to be discontinued ? Please call pharmacy and ask for the pharmacist that's in. ?

## 2022-02-03 NOTE — Telephone Encounter (Signed)
?  ?  Per phone note on 12/24/21 from Butler Denmark, NP: ?I was able to review with Dr. Brett Fairy plan post-EEG for Klonopin, she would like this to come from psych going forward since reportedly for anxiety. Can you reach out to her mother to discuss this plan?  ?  ?The prescription that was sent 02/02/22 from our office has been voided (spoke to pharmacist, Herbie Baltimore, at [place]). ? ?Dr. Casimiro Needle is now prescribing her temazepam '15mg'$ , one tab QHS.  ?

## 2022-02-09 ENCOUNTER — Ambulatory Visit
Admission: RE | Admit: 2022-02-09 | Discharge: 2022-02-09 | Disposition: A | Discharge status: Home / Self Care | Payer: Medicare (Managed Care) | Source: Ambulatory Visit | Attending: Urgent Care | Admitting: Urgent Care

## 2022-02-09 ENCOUNTER — Telehealth: Payer: Self-pay

## 2022-02-09 VITALS — BP 136/82 | HR 67 | Temp 98.1°F | Resp 18

## 2022-02-09 DIAGNOSIS — R300 Dysuria: Secondary | ICD-10-CM | POA: Diagnosis present

## 2022-02-09 DIAGNOSIS — R350 Frequency of micturition: Secondary | ICD-10-CM | POA: Diagnosis present

## 2022-02-09 LAB — POCT URINALYSIS DIP (MANUAL ENTRY)
Bilirubin, UA: NEGATIVE
Glucose, UA: NEGATIVE mg/dL
Ketones, POC UA: NEGATIVE mg/dL
Nitrite, UA: NEGATIVE
Protein Ur, POC: NEGATIVE mg/dL
Spec Grav, UA: 1.015 (ref 1.010–1.025)
Urobilinogen, UA: 0.2 E.U./dL
pH, UA: 7 (ref 5.0–8.0)

## 2022-02-09 MED ORDER — PHENAZOPYRIDINE HCL 200 MG PO TABS: 200.0000 mg | ORAL_TABLET | Freq: Three times a day (TID) | ORAL | 0 refills | Status: DC | PRN

## 2022-02-09 NOTE — Discharge Instructions (Addendum)
Make sure you hydrate very well with plain water and a quantity of 80 ounces of water a day.  Please limit drinks that are considered urinary irritants such as soda, sweet tea, coffee, energy drinks, alcohol.  These can worsen your urinary and genital symptoms but also be the source of them.  I will let you know about your urine culture results through MyChart to see if we need to prescribe or change your antibiotics based off of those results.  

## 2022-02-09 NOTE — ED Provider Notes (Signed)
?[facility] ? ? ?MRN: [id number] DOB: 02/20/1966 ? ?Subjective:  ? ?Teresa Lawrence is a 56 y.o. female presenting for 1 week history of persistent recurrent urinary frequency, dysuria, urinary urgency.  Patient does not hydrate with water much at all.  She drinks a mix of coffee, energy drinks, soda.  No fever, nausea, vomiting, hematuria, flank pain, vaginal discharge.  No concern for STI.  Patient does have a history of urinary tract infections. ? ?No current facility-administered medications for this encounter. ? ?Current Outpatient Medications:  ?  acyclovir (ZOVIRAX) 200 MG capsule, Take 200 mg by mouth 3 (three) times daily as needed (fever blisters). , Disp: , Rfl:  ?  atorvastatin (LIPITOR) 40 MG tablet, Take 1 tablet by mouth once daily, Disp: 90 tablet, Rfl: 0 ?  Cariprazine HCl (VRAYLAR PO), Take by mouth., Disp: , Rfl:  ?  fluticasone (FLONASE) 50 MCG/ACT nasal spray, Place 1 spray into both nostrils daily. (Patient not taking: Reported on 02/09/2022), Disp: 16 g, Rfl: 0 ?  hydrocortisone cream 1 %, Apply 1 application topically as needed (itching)., Disp: , Rfl:  ?  ibuprofen (ADVIL,MOTRIN) 600 MG tablet, Take 1 tablet (600 mg total) by mouth every 6 (six) hours as needed (mild pain)., Disp: 30 tablet, Rfl: 1 ?  mupirocin ointment (BACTROBAN) 2 %, Apply 1 application topically 2 (two) times daily., Disp: 22 g, Rfl: 0 ?  nitrofurantoin, macrocrystal-monohydrate, (MACROBID) 100 MG capsule, Take 1 capsule (100 mg total) by mouth 2 (two) times daily., Disp: 10 capsule, Rfl: 0 ?  pantoprazole (PROTONIX) 40 MG tablet, Take 40 mg by mouth 2 (two) times daily as needed (for reflux symptoms). , Disp: , Rfl:  ?  phenazopyridine (PYRIDIUM) 200 MG tablet, Take 1 tablet (200 mg total) by mouth 3 (three) times daily., Disp: 6 tablet, Rfl: 0 ?  PHENobarbital (LUMINAL) 64.8 MG tablet, Take 1 tablet (64.8 mg total) by mouth 2 (two) times daily., Disp: 180 tablet, Rfl: 1 ?  pramipexole (MIRAPEX) 0.25 MG  tablet, 1 in Am, 1 in Pm and 2 at night po., Disp: 360 tablet, Rfl: 2 ?  promethazine (PHENERGAN) 25 MG tablet, Take 25 mg by mouth every 6 (six) hours as needed for nausea or vomiting., Disp: , Rfl:  ?  Suvorexant (BELSOMRA) 20 MG TABS, Take 20 mg by mouth at bedtime as needed., Disp: 30 tablet, Rfl: 5 ?  tamsulosin (FLOMAX) 0.4 MG CAPS capsule, Take 1 capsule (0.4 mg total) by mouth daily after supper., Disp: 30 capsule, Rfl: 1 ?  temazepam (RESTORIL) 15 MG capsule, Take 15 mg by mouth at bedtime., Disp: , Rfl:  ?  VRAYLAR 4.5 MG CAPS, Take 1 capsule by mouth at bedtime., Disp: , Rfl:   ? ?Allergies  ?Allergen Reactions  ? Codeine Nausea And Vomiting  ? Requip [Ropinirole Hcl] Nausea And Vomiting  ? Topamax [Topiramate] Nausea Only  ? Zonegran [Zonisamide] Nausea Only  ? ? ?Past Medical History:  ?Diagnosis Date  ? Abnormal Pap smear   ? Anxiety   ? Bipolar disorder ([place])   ? Dysmenorrhea   ? Dysplasia   ? Dysrhythmia   ? episodes of palpitations  ? Dysuria   ? H/O head injury   ? Headache(784.0)   ? High blood cholesterol   ? HSV-2 infection   ? Hx of hypercholesterolemia   ? Hx of ovarian cyst   ? Hx of seizure disorder   ? Mass of ovary   ? left side  ?  Menorrhagia   ? Mental retardation   ? mild per Dr. notes in EPIC  ? Mild mental retardation   ? Perimenopausal vasomotor symptoms   ? PMS (premenstrual syndrome)   ? PMS (premenstrual syndrome)   ? PMS (premenstrual syndrome)   ? RLS (restless legs syndrome)   ? Seizures ([place])   ? Uterine mass   ? Vaginal yeast infection 2007  ? recurrent   ? Yeast vaginitis   ?  ? ?Past Surgical History:  ?Procedure Laterality Date  ? ABDOMINAL HYSTERECTOMY  2013  ? COLPOSCOPY    ? CYSTOSCOPY Bilateral 05/30/2013  ? Procedure: CYSTOSCOPY;  Surgeon: Betsy Coder, MD;  Location: [facility];  Service: Gynecology;  Laterality: Bilateral;  ? DILATION AND CURETTAGE OF UTERUS    ? ENDOMETRIAL BIOPSY    ? HYSTEROSCOPY W/ ENDOMETRIAL ABLATION    ? LAPAROSCOPIC HYSTERECTOMY Right  05/30/2013  ? Procedure: HYSTERECTOMY TOTAL LAPAROSCOPIC;  Surgeon: Betsy Coder, MD;  Location: [facility];  Service: Gynecology;  Laterality: Right;  Right Salpingectomy  ? POLYPECTOMY    ? WISDOM TOOTH EXTRACTION    ? ? ?Family History  ?Problem Relation Age of Onset  ? Heart attack Father   ? Breast cancer Sister   ? ? ?Social History  ? ?Tobacco Use  ? Smoking status: Former  ? Smokeless tobacco: Never  ? Tobacco comments:  ?  Quit 10 years ago.  ?Vaping Use  ? Vaping Use: Never used  ?Substance Use Topics  ? Alcohol use: No  ?  Alcohol/week: 0.0 standard drinks  ? Drug use: No  ? ? ?ROS ? ? ?Objective:  ? ?Vitals: ?BP 136/82 (BP Location: Right Arm)   Pulse 67   Temp 98.1 ?F (36.7 ?C) (Oral)   Resp 18   LMP 05/11/2013 (LMP Unknown)   SpO2 98%  ? ?Physical Exam ?Constitutional:   ?   General: She is not in acute distress. ?   Appearance: Normal appearance. She is well-developed. She is not ill-appearing, toxic-appearing or diaphoretic.  ?HENT:  ?   Head: Normocephalic and atraumatic.  ?   Nose: Nose normal.  ?   Mouth/Throat:  ?   Mouth: Mucous membranes are moist.  ?Eyes:  ?   General: No scleral icterus.    ?   Right eye: No discharge.     ?   Left eye: No discharge.  ?   Extraocular Movements: Extraocular movements intact.  ?   Conjunctiva/sclera: Conjunctivae normal.  ?Cardiovascular:  ?   Rate and Rhythm: Normal rate.  ?Pulmonary:  ?   Effort: Pulmonary effort is normal.  ?Abdominal:  ?   General: Bowel sounds are normal. There is no distension.  ?   Palpations: Abdomen is soft. There is no mass.  ?   Tenderness: There is no abdominal tenderness. There is no right CVA tenderness, left CVA tenderness, guarding or rebound.  ?Skin: ?   General: Skin is warm and dry.  ?Neurological:  ?   General: No focal deficit present.  ?   Mental Status: She is alert and oriented to person, place, and time.  ?Psychiatric:     ?   Mood and Affect: Mood normal.     ?   Behavior: Behavior normal.     ?   Thought Content:  Thought content normal.     ?   Judgment: Judgment normal.  ? ? ?Results for orders placed or performed during the hospital encounter of 02/09/22 (from the  past 24 hour(s))  ?POCT urinalysis dipstick     Status: Abnormal  ? Collection Time: 02/09/22 10:15 AM  ?Result Value Ref Range  ? Color, UA light yellow (A) yellow  ? Clarity, UA clear clear  ? Glucose, UA negative negative mg/dL  ? Bilirubin, UA negative negative  ? Ketones, POC UA negative negative mg/dL  ? Spec Grav, UA 1.015 1.010 - 1.025  ? Blood, UA moderate (A) negative  ? pH, UA 7.0 5.0 - 8.0  ? Protein Ur, POC negative negative mg/dL  ? Urobilinogen, UA 0.2 0.2 or 1.0 E.U./dL  ? Nitrite, UA Negative Negative  ? Leukocytes, UA Trace (A) Negative  ? ? ?Assessment and Plan :  ? ?PDMP not reviewed this encounter. ? ?1. Urinary frequency   ?2. Dysuria   ? ?Emphasized need to hydrate much better with plain water.  Avoid urinary irritants.  Urine culture pending, will prescribe antibiotics as appropriate based on those results.  In the meantime, use Pyridium for symptomatic relief. Counseled patient on potential for adverse effects with medications prescribed/recommended today, ER and return-to-clinic precautions discussed, patient verbalized understanding. ? ?  ?Jaynee Eagles, PA-C ?02/09/22 1033 ? ?

## 2022-02-09 NOTE — ED Triage Notes (Signed)
Patient presents to Urgent Care with complaints of urinary frequency, dysuria since last week. Patient reports no otc medication.  ? ?

## 2022-02-10 LAB — URINE CULTURE

## 2022-02-13 ENCOUNTER — Ambulatory Visit
Admission: EM | Admit: 2022-02-13 | Discharge: 2022-02-13 | Disposition: A | Discharge status: Home / Self Care | Payer: Medicare (Managed Care) | Attending: Physician Assistant | Admitting: Physician Assistant

## 2022-02-13 DIAGNOSIS — K121 Other forms of stomatitis: Secondary | ICD-10-CM | POA: Diagnosis not present

## 2022-02-13 DIAGNOSIS — K1379 Other lesions of oral mucosa: Secondary | ICD-10-CM

## 2022-02-13 MED ORDER — ACYCLOVIR 400 MG PO TABS: 400.0000 mg | ORAL_TABLET | Freq: Four times a day (QID) | ORAL | 0 refills | Status: DC

## 2022-02-13 NOTE — Discharge Instructions (Addendum)
Patient advised to continue using the Magic Mouthwash several times daily for pain.  Patient instructed to take the Acyclovir '400mg'$  4 times daily for the next 5 days.  Patient advised to follow-up with PCP if symptoms fail to improve or worsen over the next several days. ?

## 2022-02-13 NOTE — ED Triage Notes (Signed)
Pt c/o oral blisters in mouth first noticed last night. States they make mouth dry and are painful.  ?

## 2022-02-13 NOTE — ED Provider Notes (Signed)
?[facility] ? ? ? ?CSN: [id number] ?Arrival date & time: 02/13/22  1011 ? ? ?  ? ?History   ?Chief Complaint ?Chief Complaint  ?Patient presents with  ? oral blisters  ? ? ?HPI ?Teresa Lawrence is a 57 y.o. female.  ? ?Patient relates hx of stomatitis in the past.  Patient for the past 24 hours with sores in the left side of her mouth.  She relates the area has redness and white area.  Patient relates pain constant and localized.  No fever or chills, no sore throat, no fever.  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 ? ? ? ?Past Medical History:  ?Diagnosis Date  ? Abnormal Pap smear   ? Anxiety   ? Bipolar disorder ([place])   ? Dysmenorrhea   ? Dysplasia   ? Dysrhythmia   ? episodes of palpitations  ? Dysuria   ? H/O head injury   ? Headache(784.0)   ? High blood cholesterol   ? HSV-2 infection   ? Hx of hypercholesterolemia   ? Hx of ovarian cyst   ? Hx of seizure disorder   ? Mass of ovary   ? left side  ? Menorrhagia   ? Mental retardation   ? mild per Dr. notes in EPIC  ? Mild mental retardation   ? Perimenopausal vasomotor symptoms   ? PMS (premenstrual syndrome)   ? PMS (premenstrual syndrome)   ? PMS (premenstrual syndrome)   ? RLS (restless legs syndrome)   ? Seizures ([place])   ? Uterine mass   ? Vaginal yeast infection 2007  ? recurrent   ? Yeast vaginitis   ? ? ?Patient Active Problem List  ? Diagnosis Date Noted  ? 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 RLS (restless legs syndrome)   ? Acute cystitis with hematuria 05/09/2020  ? Nondisplaced fracture of lateral malleolus of left fibula, initial encounter for closed fracture 10/25/2017  ? Insomnia 03/17/2013  ? Headache 02/23/2013  ? Hx of yeast infection 04/20/2012  ? Hx of menorrhagia 04/20/2012  ? 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 Bipolar disorder ([place]) 04/20/2012  ? Hx of menorrhagia  04/20/2012  ? Hx of ovarian cyst 04/20/2012  ? Epilepsy ([place]) 04/20/2012  ? Hx of seizure disorder 04/20/2012  ? 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  ? Hx of dysmenorrhea 04/20/2012  ? 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 Hx of hypercholesterolemia 04/20/2012  ? Hx of PMS (premenstrual syndrome) 04/20/2012  ? Encounter for therapeutic drug monitoring 05/01/2011  ? ? ?Past Surgical History:  ?Procedure Laterality Date  ? ABDOMINAL HYSTERECTOMY  2013  ? COLPOSCOPY    ? CYSTOSCOPY Bilateral 05/30/2013  ? Procedure: CYSTOSCOPY;  Surgeon: Betsy Coder, MD;  Location: [facility];  Service: Gynecology;  Laterality: Bilateral;  ? DILATION AND CURETTAGE OF UTERUS    ? ENDOMETRIAL BIOPSY    ? HYSTEROSCOPY W/ ENDOMETRIAL ABLATION    ? LAPAROSCOPIC HYSTERECTOMY Right 05/30/2013  ? Procedure: HYSTERECTOMY TOTAL LAPAROSCOPIC;  Surgeon: Betsy Coder, MD;  Location: [facility];  Service: Gynecology;  Laterality: Right;  Right Salpingectomy  ? POLYPECTOMY    ? WISDOM TOOTH EXTRACTION    ? ? ?OB History   ?No obstetric history on file. ?  ? ? ? ?Home Medications   ? ?Prior to Admission medications   ?Medication Sig Start Date End Date  Taking? Authorizing Provider  ?acyclovir (ZOVIRAX) 400 MG tablet Take 1 tablet (400 mg total) by mouth 4 (four) times daily. 02/13/22  Yes Nyoka Lint, PA-C  ?acyclovir (ZOVIRAX) 200 MG capsule Take 200 mg by mouth 3 (three) times daily as needed (fever blisters).     [provider]  ?atorvastatin (LIPITOR) 40 MG tablet Take 1 tablet by mouth once daily 02/02/22   Saguier, Percell Miller, PA-C  ?Cariprazine HCl (VRAYLAR PO) Take by mouth.    [provider]  ?fluticasone (FLONASE) 50 MCG/ACT nasal spray Place 1 spray into both nostrils daily. ?Patient not taking: Reported on 02/09/2022 02/20/20   [name], [name], PA-C  ?hydrocortisone cream 1 % Apply 1 application topically as needed (itching).    [provider]  ?ibuprofen (ADVIL,MOTRIN) 600 MG tablet Take 1 tablet (600 mg total) by mouth  every 6 (six) hours as needed (mild pain). 05/31/13   Earnstine Regal, PA-C  ?mupirocin ointment (BACTROBAN) 2 % Apply 1 application topically 2 (two) times daily. 01/27/21   Saguier, Percell Miller, PA-C  ?nitrofurantoin, macrocrystal-monohydrate, (MACROBID) 100 MG capsule Take 1 capsule (100 mg total) by mouth 2 (two) times daily. 11/13/21   Enrique Sack, [place]  ?pantoprazole (PROTONIX) 40 MG tablet Take 40 mg by mouth 2 (two) times daily as needed (for reflux symptoms).     [provider]  ?phenazopyridine (PYRIDIUM) 200 MG tablet Take 1 tablet (200 mg total) by mouth 3 (three) times daily as needed for pain. 02/09/22   Jaynee Eagles, PA-C  ?PHENobarbital (LUMINAL) 64.8 MG tablet Take 1 tablet (64.8 mg total) by mouth 2 (two) times daily. 06/16/21   Dohmeier, Asencion Partridge, MD  ?pramipexole (MIRAPEX) 0.25 MG tablet 1 in Am, 1 in Pm and 2 at night po. 06/16/21   Dohmeier, Asencion Partridge, MD  ?promethazine (PHENERGAN) 25 MG tablet Take 25 mg by mouth every 6 (six) hours as needed for nausea or vomiting.    [provider]  ?Suvorexant (BELSOMRA) 20 MG TABS Take 20 mg by mouth at bedtime as needed. 01/03/22   Genia Harold, MD  ?tamsulosin (FLOMAX) 0.4 MG CAPS capsule Take 1 capsule (0.4 mg total) by mouth daily after supper. 08/29/21   LampteyMyrene Galas, MD  ?temazepam (RESTORIL) 15 MG capsule Take 15 mg by mouth at bedtime. 02/03/22   [provider]  ?VRAYLAR 4.5 MG CAPS Take 1 capsule by mouth at bedtime. 11/09/20   [provider]  ? ? ?Family History ?Family History  ?Problem Relation Age of Onset  ? Heart attack Father   ? Breast cancer Sister   ? ? ?Social History ?Social History  ? ?Tobacco Use  ? Smoking status: Former  ? Smokeless tobacco: Never  ? Tobacco comments:  ?  Quit 10 years ago.  ?Vaping Use  ? Vaping Use: Never used  ?Substance Use Topics  ? Alcohol use: No  ?  Alcohol/week: 0.0 standard drinks  ? Drug use: No  ? ? ? ?Allergies   ?Codeine, Requip [ropinirole hcl], Topamax [topiramate],  and Zonegran [zonisamide] ? ? ?Review of Systems ?Review of Systems  ?HENT:  Positive for mouth sores. Negative for congestion, dental problem and drooling.   ? ? ?Physical Exam ?Triage Vital Signs ?ED Triage Vitals [02/13/22 1105]  ?Enc Vitals Group  ?   BP (!) 144/80  ?   Pulse Rate 75  ?   Resp 18  ?   Temp 97.9 ?F (36.6 ?C)  ?   Temp Source Oral  ?  SpO2 95 %  ?   Weight   ?   Height   ?   Head Circumference   ?   Peak Flow   ?   Pain Score 0  ?   Pain Loc   ?   Pain Edu?   ?   Excl. in [place]?   ? ?No data found. ? ?Updated Vital Signs ?BP (!) 144/80 (BP Location: Right Arm)   Pulse 75   Temp 97.9 ?F (36.6 ?C) (Oral)   Resp 18   LMP 05/11/2013 (LMP Unknown)   SpO2 95%  ? ?Visual Acuity ?Right Eye Distance:   ?Left Eye Distance:   ?Bilateral Distance:   ? ?Right Eye Near:   ?Left Eye Near:    ?Bilateral Near:    ? ?Physical Exam ?HENT:  ?   Mouth/Throat:  ?   Mouth: Mucous membranes are moist.  ?   Pharynx: Oropharynx is clear.  ?   Comments: Mouth - redness noted on the left posterior buccal mucosa with white linear area .5cm, no drainage, no swelling, no exudate or areas that are similar to thrush present.  Dentation is normal. ? ? ? ?UC Treatments / Results  ?Labs ?(all labs ordered are listed, but only abnormal results are displayed) ?Labs Reviewed - No data to display ? ?EKG ? ? ?Radiology ?No results found. ? ?Procedures ?Procedures (including critical care time) ? ?Medications Ordered in UC ?Medications - No data to display ? ?Initial Impression / Assessment and Plan / UC Course  ?I have reviewed the triage vital signs and the nursing notes. ? ?Pertinent labs & imaging results that were available during my care of the patient were reviewed by me and considered in my medical decision making (see chart for details). ? ? Patient advised to continue using the Magic Mouthwash several times daily for pain.  Patient instructed to take the Acyclovir '400mg'$  4 times daily for the next 5 days.  Patient advised to  follow-up with PCP if symptoms fail to improve or worsen over the next several days. ? ? ?Final Clinical Impressions(s) / UC Diagnoses  ? ?Final diagnoses:  ?Stomatitis and mucositis  ?Pain in mouth  ? ? ? ?Discha

## 2022-02-27 ENCOUNTER — Other Ambulatory Visit: Payer: Self-pay | Admitting: Neurology

## 2022-03-03 ENCOUNTER — Other Ambulatory Visit: Payer: Self-pay | Admitting: Neurology

## 2022-05-04 ENCOUNTER — Other Ambulatory Visit: Payer: Self-pay | Admitting: Medical

## 2022-05-21 ENCOUNTER — Ambulatory Visit: Payer: Medicare (Managed Care) | Admitting: Neurology

## 2022-06-10 ENCOUNTER — Telehealth: Payer: Self-pay | Admitting: Neurology

## 2022-06-10 NOTE — Telephone Encounter (Signed)
I called pt. No answer, left a message asking pt to call me back.   

## 2022-06-10 NOTE — Telephone Encounter (Signed)
I called the patient.  Came off clonazepam around April 2023 Dr. Casimiro Needle started temazepam in it's place. Reports for last month an hour after taking, has facial twitching.  It can last up to an hour.  It then goes away.  Denies any seizures.  I asked her to contact Dr. Casimiro Needle to discuss.
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
## 2022-06-10 NOTE — Telephone Encounter (Signed)
Pt called stating that the temazepam (RESTORIL) 15 MG capsule is causing her to have spasms all over her face and she would like to know if she can go back to taking clonazePAM (KLONOPIN) 2 MG tablet Please advise.

## 2022-06-10 NOTE — Telephone Encounter (Signed)
Pt is asking for a call back from Tonita Cong, Therapist, sports.  Pt asking that a vm not be left if she does not pick up call, just call again.

## 2022-07-28 ENCOUNTER — Telehealth: Payer: Self-pay | Admitting: Neurology

## 2022-07-28 NOTE — Telephone Encounter (Signed)
Patient paged on call doctor. For a few weeks her nerves in face has been "jumping", allover her face and head, she cannot see the nerves moving but her head goes numb too, it "jumps" but she can't see it. She says she having shortness of breath, she can't swallow "real good" hard to go down but no choking, no swelling, no rash, no aspiration, no coughing, just "annoying", I advised I would let you both know. She did not appear to be in any distress, she was not out of breath speaking, speech was fluent and her mentation and history appeared appropriate. I advised If she gets worse or starts having worsening SOB or symptoms as above or anything concerning go to ED, however it has been stable for several weeks and I advised her I would let you know. Thanks

## 2022-07-29 NOTE — Telephone Encounter (Addendum)
Not a tic and not a neuralgia,  will have to rule out phenergan or mirapex side effect, this can be done by a primary care physician. Larey Seat, MD

## 2022-07-30 ENCOUNTER — Ambulatory Visit (INDEPENDENT_AMBULATORY_CARE_PROVIDER_SITE_OTHER): Payer: Medicare (Managed Care) | Admitting: Neurology

## 2022-07-30 ENCOUNTER — Encounter: Payer: Self-pay | Admitting: Neurology

## 2022-07-30 VITALS — BP 116/77 | HR 84 | Ht 64.0 in | Wt 234.0 lb

## 2022-07-30 DIAGNOSIS — R202 Paresthesia of skin: Secondary | ICD-10-CM | POA: Diagnosis not present

## 2022-07-30 MED ORDER — MAGNESIUM 100 MG PO TABS: ORAL_TABLET | ORAL | 5 refills | Status: DC

## 2022-07-30 MED ORDER — PHENOBARBITAL 64.8 MG PO TABS: 64.8000 mg | ORAL_TABLET | Freq: Two times a day (BID) | ORAL | 1 refills | Status: DC

## 2022-07-30 MED ORDER — BELSOMRA 20 MG PO TABS: 20.0000 mg | ORAL_TABLET | Freq: Every evening | ORAL | 5 refills | Status: DC | PRN

## 2022-07-30 MED ORDER — OYSTER SHELL CALCIUM/D3 500-5 MG-MCG PO TABS: 1.0000 | ORAL_TABLET | Freq: Every day | ORAL | 5 refills | Status: AC

## 2022-07-30 MED ORDER — PRAMIPEXOLE DIHYDROCHLORIDE 0.25 MG PO TABS: ORAL_TABLET | ORAL | 1 refills | Status: DC

## 2022-07-30 NOTE — Progress Notes (Signed)
SLEEP MEDICINE / NEUROLOGY CLINIC    Provider:  Larey Seat, MD  Primary Care Physician: Retired :  Tamsen Roers, [place] [address]     Referring Provider:  The patient is transferred in care from Dr Jannifer Franklin.         Chief Complaint according to patient   Patient presents with:     New Patient (Initial Visit)     NEW problem , facial twitching, not visible outside.       HISTORY OF PRESENT ILLNESS: 07-30-2022:Teresa Lawrence is a 56 y.o. Caucasian female and former patient of Dr.  Jannifer Franklin , who was seen here 8-22- 22 for a sleep consult, and to transfer Epilepsy care- today she is seen here for an abnormal sensation of the face: seen here on 07/30/2022 .  There was no movement visible on the outside to her mother or to her when she looks into the mirror. She was worried enough to see EMS, not ED.  I copied Dr. Cathren Laine phone call below my note from today.  Teresa Lawrence is due for medication refill, she has a history of well-controlled seizures, she has ankle edema orthopnea.  What she describes today is a feeling that her face is jumping but it is not visible on the outside.  Neither her mother could see any peripheral movement of the face nor could she herself when she looked into the mirror.  Her mother stated that her face was flushed.  She also stated that she had some trouble with swallowing during that episode.  She had also feeling as if the nerve behind her eyes would be jumping and these sensations affected the whole face and both eyes.  I wonder if this could be an aura or prodrome to his seizure.  However she has now had these abnormal sensations for several days weeks she corrected me several weeks- And there has been no seizure occurring in the same time. She takes mirapex and this controlled her RLS , she likened the facial  sensation to "restless face" as it affects her at night while at rest and not in daytime. Dull headache all over.    She is due for  medication refills and I had asked her to try gabapentin at double the dose she usually takes to see if it helps.  She informed me that this seems not to make a major difference. 07-28-2022, Dr Jaynee Eagles on call : "Patient paged on call doctor. For a few weeks her nerves in face has been "jumping", allover her face and head, she cannot see the nerves moving but her head goes numb too, it "jumps" but she can't see it. She says she having shortness of breath, she can't swallow "real good" hard to go down but no choking, no swelling, no rash, no aspiration, no coughing, just "annoying", I advised I would let you both know. She did not appear to be in any distress, she was not out of breath speaking, speech was fluent and her mentation and history appeared appropriate. I advised If she gets worse or starts having worsening SOB or symptoms as above or anything concerning go to ED, however it has been stable for several weeks and I advised her I would let you know."  I have the pleasure of seeing Teresa Lawrence today, a right-handed  Caucasian female with a possible sleep disorder.  She   has a past medical history of Abnormal  Pap smear, Anxiety, Bipolar disorder ([place]), Dysmenorrhea, Dysplasia, Dysrhythmia, Dysuria, H/O head injury, Headache(784.0), High blood cholesterol, HSV-2 infection, hypercholesterolemia, ovarian cyst, seizure disorder, Mass of ovary, Menorrhagia, Mental retardation, Mild mental retardation, Perimenopausal vasomotor symptoms, PMS (premenstrual syndrome), PMS (premenstrual syndrome), PMS (premenstrual syndrome), RLS (restless legs syndrome), Seizures ([place]), Uterine mass,The patient is suffering from OBESITY, MOOD SWINGS, Bipolar- MILD MRDD> she was taken off Seroquel by her psychiatrist, and will not need insomnia medication form here.    8-29-2022Jerrel Lawrence is a 56 y.o. Caucasian female patient of Dr.  Jannifer Franklin is seen here to transfer Epilepsy care- seen on 06-16-2021.Chief concern according  to patient :  " I have Epilepsy"  No seizures in years - patient here alone and not sure about time frame. She has  lymphoedema.     Sleep relevant medical history: Nocturia , Insomnia ,sleeps in a recliner- lymphedema, frozen shoulders.  father passed away from CAD.   Social history:  Patient is disabled,  and lives in a household with mother  persons/ alone. Family status is single, Tobacco use/.  ETOH use /, Caffeine intake in form of Coffee( 1 cup in AM ) Soda( Dr Pepper/ 2-3 week) ) Tea ( /) or energy drinks. Regular exercise- none       Sleep habits are as follows: The patient's dinner time is between 5-6 PM. The patient goes to bed at 12 PM and continues to sleep for 6-8 hours, wakes for many bathroom breaks, the first time at 2 AM.   The preferred sleep position is in a recliner - Dreams are reportedly rare.  6.45  AM is the usual rise time. She reports not feeling refreshed or restored in AM, with symptoms such as dry mouth, morning headaches, and residual fatigue. Groggy.   Naps are taken frequently, lasting from 1-2 hours.    Review of Systems: Out of a complete 14 system review, the patient complains of only the following symptoms, and all other reviewed systems are negative.:  Fatigue, sleepiness , snoring, fragmented sleep, Insomnia is chronic and related to bipolar disease.  Depression,  Seizures "   How likely are you to doze in the following situations: 0 = not likely, 1 = slight chance, 2 = moderate chance, 3 = high chance   Sitting and Reading? Watching Television? Sitting inactive in a public place (theater or meeting)? As a passenger in a car for an hour without a break? Lying down in the afternoon when circumstances permit? Sitting and talking to someone? Sitting quietly after lunch without alcohol? In a car, while stopped for a few minutes in traffic?   Total = 15/ 24 points   FSS endorsed at 56/ 63 points.   Social History   Socioeconomic History   Marital  status: Single    Spouse name: Not on file   Number of children: 0   Years of education: HS   Highest education level: Not on file  Occupational History   Occupation: Unemployed  Tobacco Use   Smoking status: Former   Smokeless tobacco: Never   Tobacco comments:    Quit 10 years ago.  Vaping Use   Vaping Use: Never used  Substance and Sexual Activity   Alcohol use: No    Alcohol/week: 0.0 standard drinks of alcohol   Drug use: No   Sexual activity: Not Currently  Other Topics Concern   Not on file  Social History Narrative   Patient lives with her mom and two  cats.   Disabled   Education high school.   Right handed.      Social Determinants of Health   Financial Resource Strain: Not on file  Food Insecurity: Not on file  Transportation Needs: Not on file  Physical Activity: Not on file  Stress: Not on file  Social Connections: Not on file    Family History  Problem Relation Age of Onset   Heart attack Father    Breast cancer Sister     Past Medical History:  Diagnosis Date   Abnormal Pap smear    Anxiety    Bipolar disorder ([place])    Dysmenorrhea    Dysplasia    Dysrhythmia    episodes of palpitations   Dysuria    H/O head injury    Headache(784.0)    High blood cholesterol    HSV-2 infection    Hx of hypercholesterolemia    Hx of ovarian cyst    Hx of seizure disorder    Mass of ovary    left side   Menorrhagia    Mental retardation    mild per Dr. notes in EPIC   Mild mental retardation    Perimenopausal vasomotor symptoms    PMS (premenstrual syndrome)    PMS (premenstrual syndrome)    PMS (premenstrual syndrome)    RLS (restless legs syndrome)    Seizures ([place])    Uterine mass    Vaginal yeast infection 2007   recurrent    Yeast vaginitis     Past Surgical History:  Procedure Laterality Date   ABDOMINAL HYSTERECTOMY  2013   COLPOSCOPY     CYSTOSCOPY Bilateral 05/30/2013   Procedure: CYSTOSCOPY;  Surgeon: Betsy Coder, MD;   Location: [facility];  Service: Gynecology;  Laterality: Bilateral;   DILATION AND CURETTAGE OF UTERUS     ENDOMETRIAL BIOPSY     HYSTEROSCOPY W/ ENDOMETRIAL ABLATION     LAPAROSCOPIC HYSTERECTOMY Right 05/30/2013   Procedure: HYSTERECTOMY TOTAL LAPAROSCOPIC;  Surgeon: Betsy Coder, MD;  Location: [facility];  Service: Gynecology;  Laterality: Right;  Right Salpingectomy   POLYPECTOMY     WISDOM TOOTH EXTRACTION       Current Outpatient Medications on File Prior to Visit  Medication Sig Dispense Refill   acyclovir (ZOVIRAX) 200 MG capsule Take 200 mg by mouth 3 (three) times daily as needed (fever blisters).      acyclovir (ZOVIRAX) 400 MG tablet Take 1 tablet (400 mg total) by mouth 4 (four) times daily. 50 tablet 0   atorvastatin (LIPITOR) 40 MG tablet Take 1 tablet by mouth once daily 90 tablet 0   Cariprazine HCl (VRAYLAR PO) Take by mouth.     fluticasone (FLONASE) 50 MCG/ACT nasal spray Place 1 spray into both nostrils daily. 16 g 0   hydrocortisone cream 1 % Apply 1 application topically as needed (itching).     ibuprofen (ADVIL,MOTRIN) 600 MG tablet Take 1 tablet (600 mg total) by mouth every 6 (six) hours as needed (mild pain). 30 tablet 1   mupirocin ointment (BACTROBAN) 2 % Apply 1 application topically 2 (two) times daily. 22 g 0   nitrofurantoin, macrocrystal-monohydrate, (MACROBID) 100 MG capsule Take 1 capsule (100 mg total) by mouth 2 (two) times daily. 10 capsule 0   pantoprazole (PROTONIX) 40 MG tablet Take 40 mg by mouth 2 (two) times daily as needed (for reflux symptoms).      phenazopyridine (PYRIDIUM) 200 MG tablet Take 1 tablet (200 mg total) by mouth  3 (three) times daily as needed for pain. 15 tablet 0   PHENobarbital (LUMINAL) 64.8 MG tablet Take 1 tablet by mouth twice daily 180 tablet 1   pramipexole (MIRAPEX) 0.25 MG tablet TAKE 1 TABLET BY MOUTH IN THE MORNING AND 1 IN THE EVENING AND 2 AT NIGHT 360 tablet 1   promethazine (PHENERGAN) 25 MG tablet Take 25 mg by mouth  every 6 (six) hours as needed for nausea or vomiting.     Suvorexant (BELSOMRA) 20 MG TABS Take 20 mg by mouth at bedtime as needed. 30 tablet 5   tamsulosin (FLOMAX) 0.4 MG CAPS capsule Take 1 capsule (0.4 mg total) by mouth daily after supper. 30 capsule 1   temazepam (RESTORIL) 15 MG capsule Take 15 mg by mouth at bedtime.     VRAYLAR 4.5 MG CAPS Take 1 capsule by mouth at bedtime.     No current facility-administered medications on file prior to visit.    Allergies  Allergen Reactions   Codeine Nausea And Vomiting   Requip [Ropinirole Hcl] Nausea And Vomiting   Topamax [Topiramate] Nausea Only   Zonegran [Zonisamide] Nausea Only    Physical exam:  Today's Vitals   07/30/22 1328  BP: 116/77  Pulse: 84  Weight: 234 lb (106.1 kg)  Height: '5\' 4"'  (1.626 m)   Body mass index is 40.17 kg/m.   Wt Readings from Last 3 Encounters:  07/30/22 234 lb (106.1 kg)  11/13/21 266 lb 15.6 oz (121.1 kg)  11/12/21 267 lb (121.1 kg)     Ht Readings from Last 3 Encounters:  07/30/22 '5\' 4"'  (1.626 m)  11/13/21 '5\' 3"'  (1.6 m)  11/12/21 '5\' 3"'  (1.6 m)      General: The patient is awake, alert and appears not in acute distress. The patient is well groomed. Her face is masked and she is psychomotor slowed.  Head: Normocephalic, atraumatic. Neck is supple. Mallampati ,  neck circumference:16.25 inches . Nasal airflow barely patent.  Retrognathia is  seen.  Dental status:  Cardiovascular:  Regular rate and cardiac rhythm by pulse,  without distended neck veins. Respiratory: Lungs are clear to auscultation.  Skin: , ankle and  leg edema,   Neurologic exam : The patient is awake and alert, oriented to place and time.   Memory subjective described as impaired , MRDD Attention span & concentration ability appears limited  Speech  with dysarthria, dysphonia and slowed  Mood and affect are delayed, slowed, lethargc    Cranial nerves: no loss of smell or taste reported  Pupils are equal and  briskly reactive to light. Funduscopic exam deferred. .  Extraocular movements in vertical and horizontal planes were intact and without nystagmus. No Diplopia. Visual fields by finger perimetry are intact. Hearing was intact to soft voice and finger rubbing.    Facial sensation intact to fine touch. Q tip.   Facial motor strength is symmetric and tongue and uvula move midline. No tics or surface movements, no titubation.  Neck ROM : limited rotation, tilt and flexion extension and frozen shoulder.   Motor exam:  slowed motion, bradykinesia.  Symmetric bulk, tone , elevated tone but without cog wheeling, symmetric grip strength .   Coordination: Rapid alternating movements in the fingers/hands were of normal speed.  The Finger-to-nose maneuver was very slow -without evidence of ataxia, dysmetria or tremor.   Gait and station: Patient could rise unassisted from a seated position, walked without assistive device.  Deep tendon reflexes: in the  upper  and lower extremities are symmetric.     After spending a total time of 25 minutes face to face and additional time for physical and neurologic examination, review of laboratory studies,  personal review of imaging studies, reports and results of other testing and review of referral information / records as far as provided in visit, I have established the following assessments:  Teresa Lawrence is a 56 year old Caucasian female with a medical history of remote seizure activity.  She has been treated with phenobarbital for decades now.  Weaning her off phenobarbital is not feasible so we will continue the treatment it has helped her to sleep at night . I see that Belsomra was prescribed by Dr. Jannifer Franklin nurse practitioner.  I also see that she was prescribed Neurontin here in this clinic.  And the Klonopin apparently is provided by [facility] as well.  There is again no longer Seroquel on board.  Mirapex she is taking Mirapex which is also provided by Butler Denmark ,  NP ,  for Dr. Jannifer Franklin.  1) facial abnormal sensation: patient presents without any external visible movement but describes and inner restlessness of the face and eye . 2) RLS related to medication and edema? Gabapentin and Mirapex have helped.  3) MRDD with question of seizures, onset at age 38 " cannot wean off phenobarbitol but the liver and alk phosphatase have to be followed. Worried about her bone density.    My Plan is to proceed with:  1)Magnesium to help with creepy crawly sensation. Add vitamin D/ calcium  for bone health and energy. Both added to er medlist as OTC preparation.  2) elevated alk phosphate - patient 's PCP to look for bone density.  3) refills of gabapentin, mirapex and belsomra  provided.   I would like to thank Tamsen Roers, MD  [place],  [place] [address] for allowing me to meet with and to take care of this pleasant patient.  Follow up through our NP Butler Denmark within 6 months.   CC: I will share my notes with  Electronically signed by: Larey Seat, MD 07/30/2022 1:42 PM  [facility] Board certified by The [employer] of Sleep Medicine and Diplomate of the [employer] of Sleep Medicine. Board certified In Neurology through the [place], Fellow of the [employer] of Neurology. Medical Director of [facility].

## 2022-08-12 ENCOUNTER — Other Ambulatory Visit: Payer: Self-pay | Admitting: Medical

## 2022-08-16 ENCOUNTER — Other Ambulatory Visit: Payer: Self-pay | Admitting: Medical

## 2022-09-09 ENCOUNTER — Ambulatory Visit: Payer: Medicare (Managed Care) | Admitting: Neurology

## 2022-11-16 ENCOUNTER — Other Ambulatory Visit: Payer: Self-pay | Admitting: Medical

## 2023-01-05 ENCOUNTER — Ambulatory Visit
Admission: EM | Admit: 2023-01-05 | Discharge: 2023-01-05 | Discharge status: Against Medical Advice | Payer: Medicare (Managed Care)

## 2023-01-05 NOTE — ED Notes (Signed)
TC to PT pt not going to see.

## 2023-01-06 ENCOUNTER — Ambulatory Visit
Admission: RE | Admit: 2023-01-06 | Discharge: 2023-01-06 | Disposition: A | Discharge status: Home / Self Care | Payer: Medicare (Managed Care) | Source: Ambulatory Visit | Attending: Family Medicine | Admitting: Family Medicine

## 2023-01-06 VITALS — BP 165/100 | HR 87 | Temp 97.7°F | Resp 16

## 2023-01-06 DIAGNOSIS — K6289 Other specified diseases of anus and rectum: Secondary | ICD-10-CM

## 2023-01-06 DIAGNOSIS — R030 Elevated blood-pressure reading, without diagnosis of hypertension: Secondary | ICD-10-CM

## 2023-01-06 MED ORDER — FLUCONAZOLE 100 MG PO TABS: ORAL_TABLET | ORAL | 0 refills | Status: DC

## 2023-01-06 MED ORDER — NIFEDIPINE POWD: 0 refills | Status: AC

## 2023-01-06 NOTE — ED Provider Notes (Signed)
[facility]   QU:[id number] 01/06/23 Arrival Time: [id number]  ASSESSMENT & PLAN:  1. Rectal pain   2. Elevated blood pressure reading without diagnosis of hypertension    No signs of skin infection or abscess formation or hemorrhoids. Question yeast infection in addition to possible anal fissure. Benign abdominal exam.   Begin: Meds ordered this encounter  Medications   NIFEdipine POWD    Sig: Please compound into 0.2% ointment to be applied to anal area tid.    Dispense:  100 g    Refill:  0   fluconazole (DIFLUCAN) 100 MG tablet    Sig: Take 2 tablets on day # 1 then 1 tablet each additional day until gone.    Dispense:  11 tablet    Refill:  0     Discharge Instructions      Your blood pressure was noted to be elevated during your visit today. If you are currently taking medication for high blood pressure, please ensure you are taking this as directed. If you do not have a history of high blood pressure and your blood pressure remains persistently elevated, you may need to begin taking a medication at some point. You may return here within the next few days to recheck if unable to see your primary care provider or if you do not have a one.  BP (!) 165/100 (BP Location: Right Arm)   Pulse 87   Temp 97.7 F (36.5 C) (Oral)   Resp 16   LMP 05/11/2013 (LMP Unknown)   SpO2 97%   BP Readings from Last 3 Encounters:  01/06/23 (!) 165/100  07/30/22 116/77  02/13/22 (!) 144/80      Follow-up Information     Tamsen Roers, MD.   Specialty: Family Medicine Why: If worsening or failing to improve as anticipated and to recheck your blood pressure. Contact information: [address] [place] [address] [phone]                Reviewed expectations re: course of current medical issues. Questions answered. Outlined signs and symptoms indicating need for more acute intervention. Patient verbalized understanding. After Visit Summary  given.   SUBJECTIVE: History from: patient. Teresa Lawrence is a 57 y.o. female who presents with complaint of rectal pain described as "pitch forks up in me", esp when sitting and with bowel movements which are non-bloody. First noted a few weeks ago. Denies fever/abd pain. Normal PO intake without n/v/d. No tx PTA.  Patient's last menstrual period was 05/11/2013 (lmp unknown).  Past Surgical History:  Procedure Laterality Date   ABDOMINAL HYSTERECTOMY  2013   COLPOSCOPY     CYSTOSCOPY Bilateral 05/30/2013   Procedure: CYSTOSCOPY;  Surgeon: Betsy Coder, MD;  Location: [facility];  Service: Gynecology;  Laterality: Bilateral;   DILATION AND CURETTAGE OF UTERUS     ENDOMETRIAL BIOPSY     HYSTEROSCOPY W/ ENDOMETRIAL ABLATION     LAPAROSCOPIC HYSTERECTOMY Right 05/30/2013   Procedure: HYSTERECTOMY TOTAL LAPAROSCOPIC;  Surgeon: Betsy Coder, MD;  Location: [facility];  Service: Gynecology;  Laterality: Right;  Right Salpingectomy   POLYPECTOMY     WISDOM TOOTH EXTRACTION     Increased blood pressure noted today. Reports that she has not been treated for hypertension in the past.  OBJECTIVE:  Vitals:   01/06/23 1047 01/06/23 1049  BP:  (!) 165/100  Pulse: 87   Resp: 16   Temp: 97.7 F (36.5 C)   TempSrc: Oral  SpO2: 97%     General appearance: alert, oriented, no acute distress Abdomen: soft; without distention; no specific tenderness to palpation Rectal: (RN chaperone present) normal rectal tone but reports sharp pain with digital exam; no masses; generalized skin irritation/red rash around rectum extending superiorly Back: without reported CVA tenderness; FROM at waist Extremities: without LE edema; symmetrical; without gross deformities Skin: warm and dry Neurologic: normal gait Psychological: alert and cooperative; normal mood and affect  Allergies  Allergen Reactions   Codeine Nausea And Vomiting   Requip [Ropinirole Hcl] Nausea And Vomiting   Topamax [Topiramate]  Nausea Only   Zonegran [Zonisamide] Nausea Only                                               Past Medical History:  Diagnosis Date   Abnormal Pap smear    Anxiety    Bipolar disorder ([facility])    Dysmenorrhea    Dysplasia    Dysrhythmia    episodes of palpitations   Dysuria    H/O head injury    Headache(784.0)    High blood cholesterol    HSV-2 infection    Hx of hypercholesterolemia    Hx of ovarian cyst    Hx of seizure disorder    Mass of ovary    left side   Menorrhagia    Mental retardation    mild per Dr. notes in EPIC   Mild mental retardation    Perimenopausal vasomotor symptoms    PMS (premenstrual syndrome)    PMS (premenstrual syndrome)    PMS (premenstrual syndrome)    RLS (restless legs syndrome)    Seizures ([facility])    Uterine mass    Vaginal yeast infection 2007   recurrent    Yeast vaginitis     Social History   Socioeconomic History   Marital status: Single    Spouse name: Not on file   Number of children: 0   Years of education: HS   Highest education level: Not on file  Occupational History   Occupation: Unemployed  Tobacco Use   Smoking status: Former   Smokeless tobacco: Never   Tobacco comments:    Quit 10 years ago.  Vaping Use   Vaping Use: Never used  Substance and Sexual Activity   Alcohol use: No    Alcohol/week: 0.0 standard drinks of alcohol   Drug use: No   Sexual activity: Not Currently  Other Topics Concern   Not on file  Social History Narrative   Patient lives with her mom and two cats.   Disabled   Education high school.   Right handed.      Social Determinants of Health   Financial Resource Strain: Not on file  Food Insecurity: Not on file  Transportation Needs: Not on file  Physical Activity: Not on file  Stress: Not on file  Social Connections: Not on file  Intimate Partner Violence: Not on file    Family History  Problem Relation Age of Onset   Heart attack Father    Breast cancer Sister       Vanessa Kick, MD 01/06/23 1110

## 2023-01-06 NOTE — ED Triage Notes (Signed)
Pt c/o "pitch forks sitting up in me" when she sits down. States this has been happening for several weeks.

## 2023-01-06 NOTE — Discharge Instructions (Addendum)
Your blood pressure was noted to be elevated during your visit today. If you are currently taking medication for high blood pressure, please ensure you are taking this as directed. If you do not have a history of high blood pressure and your blood pressure remains persistently elevated, you may need to begin taking a medication at some point. You may return here within the next few days to recheck if unable to see your primary care provider or if you do not have a one.  BP (!) 165/100 (BP Location: Right Arm)   Pulse 87   Temp 97.7 F (36.5 C) (Oral)   Resp 16   LMP 05/11/2013 (LMP Unknown)   SpO2 97%   BP Readings from Last 3 Encounters:  01/06/23 (!) 165/100  07/30/22 116/77  02/13/22 (!) 144/80

## 2023-02-04 ENCOUNTER — Encounter: Payer: Self-pay | Admitting: Neurology

## 2023-02-04 ENCOUNTER — Ambulatory Visit (INDEPENDENT_AMBULATORY_CARE_PROVIDER_SITE_OTHER): Payer: Medicare (Managed Care) | Admitting: Neurology

## 2023-02-04 VITALS — BP 161/72 | HR 74 | Ht 63.0 in | Wt 233.9 lb

## 2023-02-04 DIAGNOSIS — G2581 Restless legs syndrome: Secondary | ICD-10-CM | POA: Diagnosis not present

## 2023-02-04 DIAGNOSIS — R202 Paresthesia of skin: Secondary | ICD-10-CM

## 2023-02-04 DIAGNOSIS — G40909 Epilepsy, unspecified, not intractable, without status epilepticus: Secondary | ICD-10-CM | POA: Diagnosis not present

## 2023-02-04 DIAGNOSIS — Z8669 Personal history of other diseases of the nervous system and sense organs: Secondary | ICD-10-CM

## 2023-02-04 MED ORDER — PRAMIPEXOLE DIHYDROCHLORIDE 0.25 MG PO TABS: ORAL_TABLET | ORAL | 3 refills | Status: DC

## 2023-02-04 NOTE — Progress Notes (Signed)
Patient: Teresa Lawrence Date of Birth: [birth year]  Reason for Visit: Follow up History from: Patient Primary Neurologist: Dohmeier  ASSESSMENT AND PLAN 57 y.o. year old female   1.  History of seizures 2.  Restless leg syndrome 3.  Abnormal facial sensation, right  Teresa Lawrence is noticeably more alert and interactive than in the past.  She has had significant reduction in the number of medications she is now taking.  She has been able to stop Seroquel, gabapentin, Klonopin, Belsomra.  She is feeling well.  She will remain on Mirapex and phenobarbital.  No recent seizures.  I will update routine labs.  She remains on vitamin D and calcium.  Can add in magnesium 100 mg at bedtime if needed for leg cramps.  Follow-up in 1 year or sooner if needed.  Her phenobarbital will be due for refill next month.  Meds ordered this encounter  Medications   pramipexole (MIRAPEX) 0.25 MG tablet    Sig: TAKE 1 TABLET BY MOUTH IN THE MORNING AND 1 IN THE EVENING AND 2 AT NIGHT    Dispense:  360 tablet    Refill:  3   Lab Orders         CBC with Differential/Platelet         CMP         Phenobarbital level         Vitamin D, 25-hydroxy     HISTORY OF PRESENT ILLNESS: Today 02/04/23 She saw Dr. Vickey Huger in October 2023.  Added magnesium to help with creepy crawly sensation.  Added vitamin D and calcium for bone health.  Refill gabapentin, Mirapex, Belsomra.  On phenobarbital for seizure prevention. She looks good today, she is much more alert, off Seroquel and Klonopin from psych. Very interactive. Off gabapentin. No RLS issues, on Mirapex. Off Belsomra, it stopped working, she quit it. She is sleeping well. Right sided facial nerves jumping around her nose, is doing some better. Her mom brought her today. I don't think she ever started magnesium for leg cramps.  Does not have any complaints today.  HISTORY  Dr. Vickey Huger 07-30-2022:Teresa Lawrence is a 57 y.o. Caucasian female and former patient of Dr.   Anne Hahn , who was seen here 8-22- 22 for a sleep consult, and to transfer Epilepsy care- today she is seen here for an abnormal sensation of the face: seen here on 07/30/2022 .  There was no movement visible on the outside to her mother or to her when she looks into the mirror. She was worried enough to see EMS, not ED.  I copied Dr. Trevor Mace phone call below my note from today.  Teresa Lawrence is due for medication refill, she has a history of well-controlled seizures, she has ankle edema orthopnea.  What she describes today is a feeling that her face is jumping but it is not visible on the outside.  Neither her mother could see any peripheral movement of the face nor could she herself when she looked into the mirror.  Her mother stated that her face was flushed.  She also stated that she had some trouble with swallowing during that episode.  She had also feeling as if the nerve behind her eyes would be jumping and these sensations affected the whole face and both eyes.  I wonder if this could be an aura or prodrome to his seizure.  However she has now had these abnormal sensations for several days weeks she corrected me several weeks-  And there has been no seizure occurring in the same time. She takes mirapex and this controlled her RLS , she likened the facial  sensation to "restless face" as it affects her at night while at rest and not in daytime. Dull headache all over.     She is due for medication refills and I had asked her to try gabapentin at double the dose she usually takes to see if it helps.  She informed me that this seems not to make a major difference. 07-28-2022, Dr Lucia Gaskins on call : "Patient paged on call doctor. For a few weeks her nerves in face has been "jumping", allover her face and head, she cannot see the nerves moving but her head goes numb too, it "jumps" but she can't see it. She says she having shortness of breath, she can't swallow "real good" hard to go down but no choking, no swelling,  no rash, no aspiration, no coughing, just "annoying", I advised I would let you both know. She did not appear to be in any distress, she was not out of breath speaking, speech was fluent and her mentation and history appeared appropriate. I advised If she gets worse or starts having worsening SOB or symptoms as above or anything concerning go to ED, however it has been stable for several weeks and I advised her I would let you know."  REVIEW OF SYSTEMS: Out of a complete 14 system review of symptoms, the patient complains only of the following symptoms, and all other reviewed systems are negative.  See HPI  ALLERGIES: Allergies  Allergen Reactions   Codeine Nausea And Vomiting   Promethazine Nausea And Vomiting   Requip [Ropinirole Hcl] Nausea And Vomiting   Ropinirole Dermatitis and Nausea And Vomiting   Topamax [Topiramate] Nausea Only   Zonegran [Zonisamide] Nausea Only    HOME MEDICATIONS: Outpatient Medications Prior to Visit  Medication Sig Dispense Refill   acyclovir (ZOVIRAX) 200 MG capsule Take 200 mg by mouth 3 (three) times daily as needed (fever blisters).      atorvastatin (LIPITOR) 40 MG tablet Take 1 tablet by mouth once daily 90 tablet 0   calcium-vitamin D (OSCAL WITH D) 500-5 MG-MCG tablet Take 1 tablet by mouth daily with breakfast. 30 tablet 5   hydrocortisone cream 1 % Apply 1 application topically as needed (itching).     ibuprofen (ADVIL,MOTRIN) 600 MG tablet Take 1 tablet (600 mg total) by mouth every 6 (six) hours as needed (mild pain). 30 tablet 1   NIFEdipine POWD Please compound into 0.2% ointment to be applied to anal area tid. 100 g 0   PHENobarbital (LUMINAL) 64.8 MG tablet Take 1 tablet (64.8 mg total) by mouth 2 (two) times daily. 180 tablet 1   pramipexole (MIRAPEX) 0.25 MG tablet TAKE 1 TABLET BY MOUTH IN THE MORNING AND 1 IN THE EVENING AND 2 AT NIGHT 360 tablet 1   VRAYLAR 4.5 MG CAPS Take 1 capsule by mouth at bedtime.     Cariprazine HCl (VRAYLAR  PO) Take by mouth. (Patient not taking: Reported on 02/04/2023)     fluconazole (DIFLUCAN) 100 MG tablet Take 2 tablets on day # 1 then 1 tablet each additional day until gone. 11 tablet 0   Magnesium 100 MG TABS One at bedtime for leg spasms. 30 tablet 5   pantoprazole (PROTONIX) 40 MG tablet Take 40 mg by mouth 2 (two) times daily as needed (for reflux symptoms).      promethazine (PHENERGAN)  25 MG tablet Take 25 mg by mouth every 6 (six) hours as needed for nausea or vomiting.     Suvorexant (BELSOMRA) 20 MG TABS Take 20 mg by mouth at bedtime as needed. 30 tablet 5   No facility-administered medications prior to visit.    PAST MEDICAL HISTORY: Past Medical History:  Diagnosis Date   Abnormal Pap smear    Anxiety    Bipolar disorder    Dysmenorrhea    Dysplasia    Dysrhythmia    episodes of palpitations   Dysuria    H/O head injury    Headache(784.0)    High blood cholesterol    HSV-2 infection    Hx of hypercholesterolemia    Hx of ovarian cyst    Hx of seizure disorder    Mass of ovary    left side   Menorrhagia    Mental retardation    mild per Dr. notes in EPIC   Mild mental retardation    Perimenopausal vasomotor symptoms    PMS (premenstrual syndrome)    PMS (premenstrual syndrome)    PMS (premenstrual syndrome)    RLS (restless legs syndrome)    Seizures    Uterine mass    Vaginal yeast infection 2007   recurrent    Yeast vaginitis     PAST SURGICAL HISTORY: Past Surgical History:  Procedure Laterality Date   ABDOMINAL HYSTERECTOMY  2013   COLPOSCOPY     CYSTOSCOPY Bilateral 05/30/2013   Procedure: CYSTOSCOPY;  Surgeon: Michael Litter, MD;  Location: [facility];  Service: Gynecology;  Laterality: Bilateral;   DILATION AND CURETTAGE OF UTERUS     ENDOMETRIAL BIOPSY     HYSTEROSCOPY W/ ENDOMETRIAL ABLATION     LAPAROSCOPIC HYSTERECTOMY Right 05/30/2013   Procedure: HYSTERECTOMY TOTAL LAPAROSCOPIC;  Surgeon: Michael Litter, MD;  Location: [facility];  Service:  Gynecology;  Laterality: Right;  Right Salpingectomy   POLYPECTOMY     WISDOM TOOTH EXTRACTION      FAMILY HISTORY: Family History  Problem Relation Age of Onset   Heart attack Father    Breast cancer Sister     SOCIAL HISTORY: Social History   Socioeconomic History   Marital status: Single    Spouse name: Not on file   Number of children: 0   Years of education: HS   Highest education level: Not on file  Occupational History   Occupation: Unemployed  Tobacco Use   Smoking status: Former   Smokeless tobacco: Never   Tobacco comments:    Quit 10 years ago.  Vaping Use   Vaping Use: Never used  Substance and Sexual Activity   Alcohol use: No    Alcohol/week: 0.0 standard drinks of alcohol   Drug use: No   Sexual activity: Not Currently  Other Topics Concern   Not on file  Social History Narrative   Patient lives with her mom and two cats.   Disabled   Education high school.   Right handed.      Social Determinants of Health   Financial Resource Strain: Not on file  Food Insecurity: Not on file  Transportation Needs: Not on file  Physical Activity: Not on file  Stress: Not on file  Social Connections: Not on file  Intimate Partner Violence: Not on file    PHYSICAL EXAM  Vitals:   02/04/23 1510  BP: (!) 161/72  Pulse: 74  Weight: 233 lb 14.5 oz (106.1 kg)  Height: 5\' 3"  (1.6 m)  Body mass index is 41.43 kg/m.  Generalized: Well developed, in no acute distress  Neurological examination  Mentation: Alert oriented to time, place, history taking. Follows all commands speech and language fluent.  Much more alert and interactive than in the past. Cranial nerve II-XII: Pupils were equal round reactive to light. Extraocular movements were full, visual field were full on confrontational test. Facial sensation and strength were normal. Head turning and shoulder shrug  were normal and symmetric. Motor: The motor testing reveals 5 over 5 strength of all 4  extremities. Good symmetric motor tone is noted throughout.  She moves faster. Sensory: Sensory testing is intact to soft touch on all 4 extremities. No evidence of extinction is noted.  Coordination: Cerebellar testing reveals good finger-nose-finger and heel-to-shin bilaterally.  Gait and station: Gait is slightly wide-based, cautious. Reflexes: Deep tendon reflexes are symmetric and normal bilaterally.   DIAGNOSTIC DATA (LABS, IMAGING, TESTING) - I reviewed patient records, labs, notes, testing and imaging myself where available.  Lab Results  Component Value Date   WBC 8.3 11/12/2021   HGB 12.5 11/12/2021   HCT 39.0 11/12/2021   MCV 90 11/12/2021   PLT 333 11/12/2021      Component Value Date/Time   NA 144 11/12/2021 1435   K 4.6 11/12/2021 1435   CL 103 11/12/2021 1435   CO2 27 11/12/2021 1435   GLUCOSE 85 11/12/2021 1435   GLUCOSE 85 12/27/2020 0953   BUN 11 11/12/2021 1435   CREATININE 0.67 11/12/2021 1435   CALCIUM 9.6 11/12/2021 1435   PROT 7.7 11/12/2021 1435   ALBUMIN 4.1 11/12/2021 1435   AST 15 11/12/2021 1435   ALT 21 11/12/2021 1435   ALKPHOS 152 (H) 11/12/2021 1435   BILITOT <0.2 11/12/2021 1435   GFRNONAA 99 11/21/2020 1200   GFRAA 115 11/21/2020 1200   Lab Results  Component Value Date   CHOL 307 (H) 12/27/2020   HDL 44.20 12/27/2020   LDLCALC 247 (H) 12/27/2020   TRIG 80.0 12/27/2020   CHOLHDL 7 12/27/2020   No results found for: "HGBA1C" Lab Results  Component Value Date   VITAMINB12 405 11/21/2020   Lab Results  Component Value Date   TSH 2.470 11/21/2020    Margie Ege, AGNP-C, DNP 02/04/2023, 3:21 PM [facility] [address], [address] [place], [place] [address] [phone]

## 2023-02-04 NOTE — Patient Instructions (Addendum)
Check labs today  Continue current medications Call for any issues  See you back in 1 year

## 2023-02-05 ENCOUNTER — Other Ambulatory Visit: Payer: Self-pay | Admitting: Medical

## 2023-02-05 LAB — PHENOBARBITAL LEVEL: Phenobarbital, Serum: 25 ug/mL (ref 15–40)

## 2023-02-05 LAB — CBC WITH DIFFERENTIAL/PLATELET
Basophils Absolute: 0.1 10*3/uL (ref 0.0–0.2)
Basos: 1 %
EOS (ABSOLUTE): 0.2 10*3/uL (ref 0.0–0.4)
Eos: 2 %
Hematocrit: 38.2 % (ref 34.0–46.6)
Hemoglobin: 12.1 g/dL (ref 11.1–15.9)
Immature Grans (Abs): 0 10*3/uL (ref 0.0–0.1)
Immature Granulocytes: 0 %
Lymphocytes Absolute: 1.8 10*3/uL (ref 0.7–3.1)
Lymphs: 19 %
MCH: 28.6 pg (ref 26.6–33.0)
MCHC: 31.7 g/dL (ref 31.5–35.7)
MCV: 90 fL (ref 79–97)
Monocytes Absolute: 0.7 10*3/uL (ref 0.1–0.9)
Monocytes: 7 %
Neutrophils Absolute: 6.8 10*3/uL (ref 1.4–7.0)
Neutrophils: 71 %
Platelets: 356 10*3/uL (ref 150–450)
RBC: 4.23 x10E6/uL (ref 3.77–5.28)
RDW: 13.3 % (ref 11.7–15.4)
WBC: 9.6 10*3/uL (ref 3.4–10.8)

## 2023-02-05 LAB — COMPREHENSIVE METABOLIC PANEL
ALT: 20 IU/L (ref 0–32)
AST: 16 IU/L (ref 0–40)
Albumin/Globulin Ratio: 1.1 — ABNORMAL LOW (ref 1.2–2.2)
Albumin: 4.1 g/dL (ref 3.8–4.9)
Alkaline Phosphatase: 147 IU/L — ABNORMAL HIGH (ref 44–121)
BUN/Creatinine Ratio: 15 (ref 9–23)
BUN: 10 mg/dL (ref 6–24)
Bilirubin Total: 0.3 mg/dL (ref 0.0–1.2)
CO2: 25 mmol/L (ref 20–29)
Calcium: 9.4 mg/dL (ref 8.7–10.2)
Chloride: 102 mmol/L (ref 96–106)
Creatinine, Ser: 0.68 mg/dL (ref 0.57–1.00)
Globulin, Total: 3.6 g/dL (ref 1.5–4.5)
Glucose: 89 mg/dL (ref 70–99)
Potassium: 4.2 mmol/L (ref 3.5–5.2)
Sodium: 142 mmol/L (ref 134–144)
Total Protein: 7.7 g/dL (ref 6.0–8.5)
eGFR: 102 mL/min/{1.73_m2} (ref >59)

## 2023-02-05 LAB — VITAMIN D 25 HYDROXY (VIT D DEFICIENCY, FRACTURES): Vit D, 25-Hydroxy: 26.9 ng/mL — ABNORMAL LOW (ref 30.0–100.0)

## 2023-02-08 ENCOUNTER — Other Ambulatory Visit: Payer: Self-pay | Admitting: Physician Assistant

## 2023-02-08 DIAGNOSIS — Z1231 Encounter for screening mammogram for malignant neoplasm of breast: Secondary | ICD-10-CM

## 2023-02-11 ENCOUNTER — Telehealth: Payer: Self-pay

## 2023-02-11 NOTE — Telephone Encounter (Signed)
-----   Message from Glean Salvo, NP sent at 02/10/2023  1:40 PM EDT ----- Please call, labs show mild elevation of alkaline phosphatase stable, slightly low vitamin D level 26.  She can take 2 [place]scal-Vitamin D tablets instead of 1.  Rest of blood work is fine.  Thanks, Maralyn Sago

## 2023-02-11 NOTE — Telephone Encounter (Signed)
Left message for pt to call back to obtain results

## 2023-02-16 ENCOUNTER — Ambulatory Visit
Admission: RE | Admit: 2023-02-16 | Discharge: 2023-02-16 | Disposition: A | Discharge status: Home / Self Care | Payer: Medicare (Managed Care) | Source: Ambulatory Visit | Attending: Physician Assistant | Admitting: Physician Assistant

## 2023-02-16 DIAGNOSIS — Z1231 Encounter for screening mammogram for malignant neoplasm of breast: Secondary | ICD-10-CM

## 2023-02-18 ENCOUNTER — Other Ambulatory Visit: Payer: Self-pay | Admitting: Physician Assistant

## 2023-02-18 DIAGNOSIS — R928 Other abnormal and inconclusive findings on diagnostic imaging of breast: Secondary | ICD-10-CM

## 2023-02-25 ENCOUNTER — Ambulatory Visit
Admission: EM | Admit: 2023-02-25 | Discharge: 2023-02-25 | Disposition: A | Discharge status: Home / Self Care | Payer: Medicare (Managed Care)

## 2023-02-25 DIAGNOSIS — R202 Paresthesia of skin: Secondary | ICD-10-CM | POA: Diagnosis not present

## 2023-02-25 NOTE — ED Triage Notes (Signed)
Pt states it feels likes her face is jumping, and it spreads to her ears and her throat, x 2 mo happens on and off pt states she takes propranolol it gets worse.

## 2023-02-25 NOTE — ED Provider Notes (Signed)
[name]    CSN: [id number] Arrival date & time: 02/25/23  1839      History   Chief Complaint No chief complaint on file.   HPI Teresa Lawrence is a 57 y.o. female.   Patient presents today for facial tingling sensation that has been ongoing for multiple months.  She has seen neurology and her psychiatrist for this.  It appears that she was first evaluated for this by neurology on 07/30/2022.  They prescribed magnesium and calcium.  Then, she followed up with neurology again on 4/18.  She states that neurology was not exactly sure what was wrong or what was causing it.  Per the note, it appears that they were concerned that it could be a prodrome to her seizures as she does have a history of epilepsy.  She reports that she has not had a seizure in multiple years.  Patient reports that her psychiatrist prescribed her propranolol to see if it would be helpful but that has not been helpful and she thinks that when she takes the propranolol that it makes it worse.  She reports that she has a tingling sensation with associated pain throughout her entirety of her face and has associated headache.  When it occurs, she also feels short of breath.  She states that it occurs randomly and not daily.  When it occurs it is intermittent for about 3 to 4 hours.     Past Medical History:  Diagnosis Date   Abnormal Pap smear    Anxiety    Bipolar disorder (HCC)    Dysmenorrhea    Dysplasia    Dysrhythmia    episodes of palpitations   Dysuria    H/O head injury    Headache(784.0)    High blood cholesterol    HSV-2 infection    Hx of hypercholesterolemia    Hx of ovarian cyst    Hx of seizure disorder    Mass of ovary    left side   Menorrhagia    Mental retardation    mild per Dr. 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   RLS (restless legs syndrome)    Acute cystitis with hematuria 05/09/2020   Nondisplaced fracture of lateral malleolus of left fibula, initial encounter for closed fracture 10/25/2017   Insomnia 03/17/2013   Headache 02/23/2013   Hx of yeast infection 04/20/2012   Hx of menorrhagia 04/20/2012   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   Bipolar disorder (HCC) 04/20/2012   Hx of menorrhagia 04/20/2012   Hx of ovarian cyst 04/20/2012   Epilepsy (HCC) 04/20/2012   Hx of seizure disorder 04/20/2012   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   Hx of dysmenorrhea 04/20/2012   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   Hx of hypercholesterolemia 04/20/2012   Hx of PMS (premenstrual syndrome) 04/20/2012   Encounter for therapeutic drug monitoring 05/01/2011    Past Surgical History:  Procedure Laterality Date   ABDOMINAL HYSTERECTOMY  2013   COLPOSCOPY     CYSTOSCOPY Bilateral 05/30/2013   Procedure: CYSTOSCOPY;  Surgeon: Michael Litter, MD;  Location: [facility];  Service: Gynecology;  Laterality: Bilateral;   DILATION AND CURETTAGE OF UTERUS     ENDOMETRIAL BIOPSY     HYSTEROSCOPY W/ ENDOMETRIAL ABLATION     LAPAROSCOPIC HYSTERECTOMY Right 05/30/2013   Procedure: HYSTERECTOMY TOTAL LAPAROSCOPIC;  Surgeon: Michael Litter, MD;  Location: [facility];  Service: Gynecology;  Laterality: Right;  Right Salpingectomy   POLYPECTOMY     WISDOM TOOTH EXTRACTION      OB History   No obstetric history on file.      Home Medications    Prior to Admission medications   Medication Sig Start Date End Date Taking? Authorizing Provider   acyclovir (ZOVIRAX) 200 MG capsule Take 200 mg by mouth 3 (three) times daily as needed (fever blisters).     [provider]  atorvastatin (LIPITOR) 40 MG tablet Take 1 tablet by mouth once daily 02/05/23   Saguier, Ramon Dredge, PA-C  calcium-vitamin D (OSCAL WITH D) 500-5 MG-MCG tablet Take 1 tablet by mouth daily with breakfast. 07/30/22   Dohmeier, Porfirio Mylar, MD  hydrocortisone cream 1 % Apply 1 application topically as needed (itching).    [provider]  ibuprofen (ADVIL,MOTRIN) 600 MG tablet Take 1 tablet (600 mg total) by mouth every 6 (six) hours as needed (mild pain). 05/31/13   Henreitta Leber, PA-C  NIFEdipine POWD Please compound into 0.2% ointment to be applied to anal area tid. 01/06/23   Mardella Layman, MD  PHENobarbital (LUMINAL) 64.8 MG tablet Take 1 tablet (64.8 mg total) by mouth 2 (two) times daily. 07/30/22   Dohmeier, Porfirio Mylar, MD  pramipexole (MIRAPEX) 0.25 MG tablet TAKE 1 TABLET BY MOUTH IN THE MORNING AND 1 IN THE EVENING AND 2 AT NIGHT 02/04/23   Glean Salvo, NP  VRAYLAR 4.5 MG CAPS Take 1 capsule by mouth at bedtime. 11/09/20   [provider]    Family History Family History  Problem Relation Age of Onset   Heart attack Father    Breast cancer Sister     Social History Social History   Tobacco Use   Smoking status: Former   Smokeless tobacco: Never   Tobacco comments:    Quit 10 years ago.  Vaping Use   Vaping Use: Never used  Substance Use Topics   Alcohol use: No    Alcohol/week: 0.0 standard drinks of alcohol   Drug use: No     Allergies   Codeine, Promethazine, Requip [ropinirole hcl], Ropinirole, Topamax [topiramate], and Zonegran [zonisamide]   Review of Systems Review of Systems Per HPI  Physical Exam Triage Vital Signs ED Triage Vitals  Enc Vitals Group     BP 02/25/23 1847 (!) 141/81     Pulse Rate 02/25/23 1847 79     Resp 02/25/23 1847 14     Temp 02/25/23 1847 98.4 F (36.9 C)     Temp Source 02/25/23 1847  Oral     SpO2 02/25/23 1847 96 %     Weight --      Height --      Head Circumference --      Peak Flow --      Pain Score 02/25/23 1849 0     Pain Loc --      Pain Edu? --      Excl. in GC? --  No data found.  Updated Vital Signs BP (!) 141/81 (BP Location: Right Arm)   Pulse 79   Temp 98.4 F (36.9 C) (Oral)   Resp 14   LMP 05/11/2013 (LMP Unknown)   SpO2 96%   Visual Acuity Right Eye Distance:   Left Eye Distance:   Bilateral Distance:    Right Eye Near:   Left Eye Near:    Bilateral Near:     Physical Exam Constitutional:      General: She is not in acute distress.    Appearance: Normal appearance. She is not toxic-appearing or diaphoretic.  HENT:     Head: Normocephalic and atraumatic.     Mouth/Throat:     Comments: Throat normal.  No obvious swelling.  No twitching, swelling, discoloration noted to the face. Eyes:     Extraocular Movements: Extraocular movements intact.     Conjunctiva/sclera: Conjunctivae normal.  Pulmonary:     Effort: Pulmonary effort is normal.  Neurological:     General: No focal deficit present.     Mental Status: She is alert and oriented to person, place, and time. Mental status is at baseline.     Cranial Nerves: Cranial nerves 2-12 are intact.     Sensory: Sensation is intact.     Motor: Motor function is intact.     Coordination: Coordination is intact.     Gait: Gait is intact.  Psychiatric:        Mood and Affect: Mood normal.        Behavior: Behavior normal.        Thought Content: Thought content normal.        Judgment: Judgment normal.      UC Treatments / Results  Labs (all labs ordered are listed, but only abnormal results are displayed) Labs Reviewed - No data to display  EKG   Radiology No results found.  Procedures Procedures (including critical care time)  Medications Ordered in UC Medications - No data to display  Initial Impression / Assessment and Plan / UC Course  I have reviewed the  triage vital signs and the nursing notes.  Pertinent labs & imaging results that were available during my care of the patient were reviewed by me and considered in my medical decision making (see chart for details).     Patient is sitting upright in chair in no acute distress and vital signs are stable.  Neuroexam is also stable which is reassuring that patient is not having an allergic reaction and does not need emergent evaluation.  Patient reports the symptoms have been ongoing for multiple months.  Discussed with patient following up with her neurologist for this given limited resources here in urgent care for further evaluation.  She was given strict ER precautions as well.  Patient verbalized understanding and was agreeable with plan. Final Clinical Impressions(s) / UC Diagnoses   Final diagnoses:  Facial tingling sensation     Discharge Instructions      Follow-up with your neurologist.  Go to the ER if symptoms significantly worsen.    ED Prescriptions   None    PDMP not reviewed this encounter.   Gustavus Bryant, [place] 02/25/23 1924

## 2023-02-25 NOTE — Discharge Instructions (Signed)
Follow-up with your neurologist.  Go to the ER if symptoms significantly worsen.

## 2023-02-27 ENCOUNTER — Other Ambulatory Visit: Payer: Self-pay | Admitting: Neurology

## 2023-03-03 ENCOUNTER — Other Ambulatory Visit: Payer: Medicare (Managed Care)

## 2023-05-14 ENCOUNTER — Other Ambulatory Visit: Payer: Self-pay | Admitting: Medical

## 2023-06-04 ENCOUNTER — Other Ambulatory Visit: Payer: Self-pay | Admitting: Neurology

## 2023-06-07 ENCOUNTER — Other Ambulatory Visit: Payer: Self-pay | Admitting: Neurology

## 2023-06-07 NOTE — Telephone Encounter (Signed)
Requested Prescriptions   Pending Prescriptions Disp Refills   PHENobarbital (LUMINAL) 64.8 MG tablet 180 tablet 1    Sig: Take 1 tablet (64.8 mg total) by mouth 2 (two) times daily.   Last seen 02/04/23, next appt 02/10/24  Dispenses    Dispensed Days Supply Quantity Provider Pharmacy  PHENOBARBITAL 64.8MG  TAB 03/04/2023 90 180 each Dohmeier, Porfirio Mylar, MD [facility] [phone] ...  PHENOBARBITAL 64.8MG  TAB 12/04/2022 90 180 each Dohmeier, Porfirio Mylar, MD [facility] [phone] ...  PHENOBARBITAL 64.8MG  TAB 09/03/2022 90 180 each Dohmeier, Porfirio Mylar, MD [facility] [phone] .Marland KitchenMarland Kitchen

## 2023-06-07 NOTE — Telephone Encounter (Signed)
Pt is requesting a refill for  PHENobarbital (LUMINAL) 64.8 MG tablet.  Pharmacy: [facility] [phone]

## 2023-06-08 MED ORDER — PHENOBARBITAL 64.8 MG PO TABS: 64.8000 mg | ORAL_TABLET | Freq: Two times a day (BID) | ORAL | 1 refills | Status: DC

## 2023-07-16 ENCOUNTER — Telehealth: Payer: Self-pay | Admitting: Neurology

## 2023-07-16 MED ORDER — GABAPENTIN 100 MG PO CAPS: 100.0000 mg | ORAL_CAPSULE | Freq: Three times a day (TID) | ORAL | 2 refills | Status: DC

## 2023-07-16 NOTE — Telephone Encounter (Signed)
I received a message from the answering service "her nerves in her face is jumping and her head just does not feel right" going through her chart this looks like symptoms she has been experiencing off-and-on.  I called a couple minutes later at 7:40 PM and got voicemail.  I left a message.  I was able to reach her a few days later at 7:50 PM.  She has had the symptoms in the past.  She thinks she may have had some benefit when she was on gabapentin.  I note her dose had been low in the past.  I will send in a prescription for gabapentin 100 mg p.o. 3 times daily.  She is advised to call us back in a couple weeks during business hours if she is not noticing any improvement

## 2023-07-24 ENCOUNTER — Ambulatory Visit
Admission: RE | Admit: 2023-07-24 | Discharge: 2023-07-24 | Disposition: A | Discharge status: Home / Self Care | Payer: Medicare (Managed Care) | Source: Ambulatory Visit | Attending: Physician Assistant | Admitting: Physician Assistant

## 2023-07-24 VITALS — BP 120/82 | HR 74 | Temp 98.3°F | Ht 63.0 in | Wt 230.0 lb

## 2023-07-24 DIAGNOSIS — N760 Acute vaginitis: Secondary | ICD-10-CM | POA: Diagnosis present

## 2023-07-24 LAB — POCT URINALYSIS DIP (MANUAL ENTRY)
Bilirubin, UA: NEGATIVE
Glucose, UA: NEGATIVE mg/dL
Ketones, POC UA: NEGATIVE mg/dL
Nitrite, UA: NEGATIVE
Protein Ur, POC: NEGATIVE mg/dL
Spec Grav, UA: 1.02 (ref 1.010–1.025)
Urobilinogen, UA: 0.2 U/dL
pH, UA: 8.5 — AB (ref 5.0–8.0)

## 2023-07-24 MED ORDER — FLUCONAZOLE 150 MG PO TABS: ORAL_TABLET | ORAL | 0 refills | Status: AC

## 2023-07-24 NOTE — ED Triage Notes (Signed)
Patient reports burning and itching when she urinates x 3 days. No treatment used.

## 2023-07-24 NOTE — ED Provider Notes (Signed)
[name]    CSN: [id number] Arrival date & time: 07/24/23  1040      History   Chief Complaint No chief complaint on file.   HPI Teresa Lawrence is a 57 y.o. female.   Patient here today for evaluation of vaginal itching and burning.  She reports that she does have some symptoms when she urinates but this occurs without urination as well.  She denies any fever.  She has not any abdominal pain or back pain.  She denies any vaginal discharge.  She states symptoms are similar to prior yeast infections.  No concerns for STDs  The history is provided by the patient.    Past Medical History:  Diagnosis Date   Abnormal Pap smear    Anxiety    Bipolar disorder (HCC)    Dysmenorrhea    Dysplasia    Dysrhythmia    episodes of palpitations   Dysuria    H/O head injury    Headache(784.0)    High blood cholesterol    HSV-2 infection    Hx of hypercholesterolemia    Hx of ovarian cyst    Hx of seizure disorder    Mass of ovary    left side   Menorrhagia    Mental retardation    mild per Dr. 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   RLS (restless legs syndrome)    Acute cystitis with hematuria 05/09/2020   Nondisplaced fracture of lateral malleolus of left fibula, initial encounter for closed fracture 10/25/2017   Insomnia 03/17/2013   Headache 02/23/2013   Hx of yeast infection 04/20/2012   Hx of menorrhagia 04/20/2012   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   Bipolar disorder (HCC) 04/20/2012   Hx of menorrhagia 04/20/2012   Hx of ovarian cyst 04/20/2012   Epilepsy (HCC) 04/20/2012   Hx of seizure disorder 04/20/2012   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   Hx of dysmenorrhea 04/20/2012   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   Hx of hypercholesterolemia 04/20/2012   Hx of PMS (premenstrual syndrome) 04/20/2012   Encounter for therapeutic drug monitoring 05/01/2011    Past Surgical History:  Procedure Laterality Date   ABDOMINAL HYSTERECTOMY  2013   COLPOSCOPY     CYSTOSCOPY Bilateral 05/30/2013   Procedure: CYSTOSCOPY;  Surgeon: Michael Litter, MD;  Location: [facility];  Service: Gynecology;  Laterality: Bilateral;   DILATION AND CURETTAGE OF UTERUS     ENDOMETRIAL BIOPSY     HYSTEROSCOPY W/ ENDOMETRIAL ABLATION     LAPAROSCOPIC HYSTERECTOMY Right 05/30/2013   Procedure: HYSTERECTOMY TOTAL LAPAROSCOPIC;  Surgeon: Michael Litter, MD;  Location: [facility];  Service: Gynecology;  Laterality: Right;  Right Salpingectomy   POLYPECTOMY     WISDOM TOOTH EXTRACTION      OB History   No obstetric history on file.      Home Medications    Prior to  Admission medications   Medication Sig Start Date End Date Taking? Authorizing Provider  fluconazole (DIFLUCAN) 150 MG tablet Take one tab PO today and repeat dose in 3 days if symptoms persist 07/24/23  Yes Tomi Bamberger, PA-C  acyclovir (ZOVIRAX) 200 MG capsule Take 200 mg by mouth 3 (three) times daily as needed (fever blisters).     [provider]  atorvastatin (LIPITOR) 40 MG tablet Take 1 tablet by mouth once daily 05/14/23   Saguier, Ramon Dredge, PA-C  calcium-vitamin D (OSCAL WITH D) 500-5 MG-MCG tablet Take 1 tablet by mouth daily with breakfast. 07/30/22   Dohmeier, Porfirio Mylar, MD  gabapentin (NEURONTIN) 100 MG capsule Take 1 capsule (100 mg total) by mouth 3 (three) times daily. 07/16/23   Sater, Pearletha Furl, MD  hydrocortisone cream 1 % Apply 1 application topically  as needed (itching).    [provider]  ibuprofen (ADVIL,MOTRIN) 600 MG tablet Take 1 tablet (600 mg total) by mouth every 6 (six) hours as needed (mild pain). 05/31/13   Henreitta Leber, PA-C  NIFEdipine POWD Please compound into 0.2% ointment to be applied to anal area tid. 01/06/23   Mardella Layman, MD  PHENobarbital (LUMINAL) 64.8 MG tablet Take 1 tablet (64.8 mg total) by mouth 2 (two) times daily. 06/08/23   Dohmeier, Porfirio Mylar, MD  pramipexole (MIRAPEX) 0.25 MG tablet TAKE 1 TABLET BY MOUTH IN THE MORNING AND 1 IN THE EVENING AND 2 AT NIGHT 02/04/23   Glean Salvo, NP  VRAYLAR 4.5 MG CAPS Take 1 capsule by mouth at bedtime. 11/09/20   [provider]    Family History Family History  Problem Relation Age of Onset   Heart attack Father    Breast cancer Sister     Social History Social History   Tobacco Use   Smoking status: Former   Smokeless tobacco: Never   Tobacco comments:    Quit 10 years ago.  Vaping Use   Vaping status: Never Used  Substance Use Topics   Alcohol use: No    Alcohol/week: 0.0 standard drinks of alcohol   Drug use: No     Allergies   Codeine, Promethazine, Requip [ropinirole hcl], Ropinirole, Topamax [topiramate], and Zonegran [zonisamide]   Review of Systems Review of Systems  Constitutional:  Negative for chills and fever.  Eyes:  Negative for discharge and redness.  Respiratory:  Negative for shortness of breath.   Gastrointestinal:  Negative for abdominal pain, nausea and vomiting.  Genitourinary:  Negative for dysuria and vaginal discharge.     Physical Exam Triage Vital Signs ED Triage Vitals  Encounter Vitals Group     BP 07/24/23 1123 120/82     Systolic BP Percentile --      Diastolic BP Percentile --      Pulse Rate 07/24/23 1123 74     Resp --      Temp 07/24/23 1123 98.3 F (36.8 C)     Temp Source 07/24/23 1123 Oral     SpO2 07/24/23 1123 93 %     Weight 07/24/23 1122 230 lb (104.3 kg)     Height 07/24/23  1122 5\' 3"  (1.6 m)     Head Circumference --      Peak Flow --      Pain Score --      Pain Loc --      Pain Education --      Exclude from Growth Chart --    No data found.  Updated Vital  Signs BP 120/82 (BP Location: Right Arm)   Pulse 74   Temp 98.3 F (36.8 C) (Oral)   Ht 5\' 3"  (1.6 m)   Wt 230 lb (104.3 kg)   LMP 05/11/2013 (LMP Unknown)   SpO2 93%   BMI 40.74 kg/m      Physical Exam Vitals and nursing note reviewed.  Constitutional:      General: She is not in acute distress.    Appearance: Normal appearance. She is not ill-appearing.  HENT:     Head: Normocephalic and atraumatic.  Eyes:     Conjunctiva/sclera: Conjunctivae normal.  Cardiovascular:     Rate and Rhythm: Normal rate.  Pulmonary:     Effort: Pulmonary effort is normal. No respiratory distress.  Neurological:     Mental Status: She is alert.  Psychiatric:        Mood and Affect: Mood normal.        Behavior: Behavior normal.        Thought Content: Thought content normal.      UC Treatments / Results  Labs (all labs ordered are listed, but only abnormal results are displayed) Labs Reviewed  POCT URINALYSIS DIP (MANUAL ENTRY) - Abnormal; Notable for the following components:      Result Value   Color, UA light yellow (*)    Clarity, UA cloudy (*)    Blood, UA small (*)    pH, UA 8.5 (*)    Leukocytes, UA Trace (*)    All other components within normal limits  URINE CULTURE    EKG   Radiology No results found.  Procedures Procedures (including critical care time)  Medications Ordered in UC Medications - No data to display  Initial Impression / Assessment and Plan / UC Course  I have reviewed the triage vital signs and the nursing notes.  Pertinent labs & imaging results that were available during my care of the patient were reviewed by me and considered in my medical decision making (see chart for details).     Will treat to cover yeast infection given similar symptoms  with same.  Urine culture ordered.  Recommended follow-up if no gradual improvement with any further concerns.   Final Clinical Impressions(s) / UC Diagnoses   Final diagnoses:  Acute vaginitis   Discharge Instructions   None    ED Prescriptions     Medication Sig Dispense Auth. Provider   fluconazole (DIFLUCAN) 150 MG tablet Take one tab PO today and repeat dose in 3 days if symptoms persist 2 tablet Tomi Bamberger, PA-C      PDMP not reviewed this encounter.   Tomi Bamberger, PA-C 07/24/23 1311

## 2023-07-25 LAB — URINE CULTURE: Culture: 20000 — AB

## 2023-07-26 ENCOUNTER — Telehealth: Payer: Self-pay | Admitting: *Deleted

## 2023-07-26 MED ORDER — AMOXICILLIN 500 MG PO CAPS: 500.0000 mg | ORAL_CAPSULE | Freq: Two times a day (BID) | ORAL | 0 refills | Status: AC

## 2023-07-26 NOTE — Telephone Encounter (Signed)
-----   Message from Tomi Bamberger sent at 07/26/2023  8:35 AM EDT ----- Please call patient- she will need treatment with Amoxicillin 500 mg BID for 7 days. Thank you.

## 2023-07-26 NOTE — Telephone Encounter (Signed)
Spoke with Ms. Villela and advised her of need for antibiotics. Rx for Amoxicillin 500 mg BID for 7 days sent to [facility] on [address] [place] per pt request.

## 2023-07-31 ENCOUNTER — Telehealth: Payer: Self-pay | Admitting: Neurology

## 2023-07-31 NOTE — Telephone Encounter (Signed)
Called on-call physician multiple times for facial feel jumpy, low-dose gabapentin 100 mg 3 times a day seems to help her some, advised her to take higher dose, maximum 6 tablets in 1 day  She is also on polypharmacy phenobarbital 64.8 mg twice a day, Mirapex 0.25 mg twice a day  Chart reviewed, similar complaints for many years, previously on polypharmacy because significant side effect, become noticeable alert after simplify medication treatment  Lab in April normal CMP, CBC, phenobarbital 25  Please call patient to guide the treatment

## 2023-08-02 NOTE — Telephone Encounter (Signed)
I called the patient.  She has been taking gabapentin 200 mg once daily as needed for facial jumpy, twitching sensation.  It is helpful.  She cannot take more than 200 mg due to side effect of gait instability, drowsiness.  She will continue taking gabapentin as needed.  I did remind her of utilizing the on-call physician for emergent problems.  This has been a longstanding chronic problem.

## 2023-09-25 ENCOUNTER — Other Ambulatory Visit: Payer: Self-pay | Admitting: Neurology

## 2023-09-27 ENCOUNTER — Telehealth: Payer: Self-pay | Admitting: Neurology

## 2023-09-27 NOTE — Telephone Encounter (Signed)
Pt is requesting a refill for PHENobarbital (LUMINAL) 64.8 MG tablet .  Pharmacy: [id number]

## 2023-09-27 NOTE — Telephone Encounter (Signed)
Requested Prescriptions   Pending Prescriptions Disp Refills   PHENobarbital (LUMINAL) 64.8 MG tablet 180 tablet 1    Sig: Take 1 tablet (64.8 mg total) by mouth 2 (two) times daily.   Last seen 02/04/23, next appt 02/10/24 Dispenses    Dispensed Days Supply Quantity Provider Pharmacy  PHENOBARBITAL 64.8MG  TAB 09/15/2023 90 180 each Dohmeier, Porfirio Mylar, MD [facility] [phone] ...  PHENOBARBITAL 64.8MG  TAB 06/07/2023 90 180 each Dohmeier, Porfirio Mylar, MD [facility] [phone] ...  PHENOBARBITAL 64.8MG  TAB 03/04/2023 90 180 each Dohmeier, Porfirio Mylar, MD [facility] [phone] ...  PHENOBARBITAL 64.8MG  TAB 12/04/2022 90 180 each Dohmeier, Porfirio Mylar, MD [facility] [phone] ...       PHONE ROOM: PLEASE ADVISE PT THAT IT SHOWS ON DRUG REGISTRY THAT A 90 DAY SUPPLY WAS DISPENSED ON 09/15/23 AND THAT THEY ARE REQUESTING REFILL TO SOON.  THANKS,  Teresa Lawrence

## 2023-09-27 NOTE — Telephone Encounter (Signed)
Pt called and made aware to contact [facility] to see if they have it ready since they stated that they never received it and instructed them to contact me and let me know either way

## 2023-10-13 ENCOUNTER — Other Ambulatory Visit: Payer: Self-pay | Admitting: Neurology

## 2023-10-14 ENCOUNTER — Other Ambulatory Visit: Payer: Self-pay

## 2023-10-25 ENCOUNTER — Ambulatory Visit: Payer: Medicare (Managed Care) | Admitting: Internal Medicine

## 2023-11-16 ENCOUNTER — Encounter: Payer: Self-pay | Admitting: Physician Assistant

## 2023-11-16 ENCOUNTER — Ambulatory Visit
Admission: RE | Admit: 2023-11-16 | Discharge: 2023-11-16 | Disposition: A | Discharge status: Home / Self Care | Payer: Medicare Other | Source: Ambulatory Visit | Attending: Physician Assistant

## 2023-11-16 ENCOUNTER — Other Ambulatory Visit: Payer: Self-pay | Admitting: Physician Assistant

## 2023-11-16 ENCOUNTER — Ambulatory Visit
Admission: RE | Admit: 2023-11-16 | Discharge: 2023-11-16 | Disposition: A | Discharge status: Home / Self Care | Payer: Self-pay | Source: Ambulatory Visit | Attending: Physician Assistant | Admitting: Physician Assistant

## 2023-11-16 DIAGNOSIS — R928 Other abnormal and inconclusive findings on diagnostic imaging of breast: Secondary | ICD-10-CM

## 2023-11-16 DIAGNOSIS — N6489 Other specified disorders of breast: Secondary | ICD-10-CM

## 2023-11-18 ENCOUNTER — Ambulatory Visit
Admission: RE | Admit: 2023-11-18 | Discharge: 2023-11-18 | Disposition: A | Discharge status: Home / Self Care | Payer: Medicare Other | Source: Ambulatory Visit | Attending: Physician Assistant | Admitting: Physician Assistant

## 2023-11-18 DIAGNOSIS — N6489 Other specified disorders of breast: Secondary | ICD-10-CM

## 2023-11-18 HISTORY — PX: BREAST BIOPSY: SHX20

## 2023-11-19 LAB — SURGICAL PATHOLOGY

## 2024-01-09 ENCOUNTER — Other Ambulatory Visit: Payer: Self-pay | Admitting: Neurology

## 2024-01-11 ENCOUNTER — Other Ambulatory Visit: Payer: Self-pay | Admitting: Neurology

## 2024-01-11 NOTE — Telephone Encounter (Signed)
Pt called wanting to know when this will be called in for her. Please advise.  

## 2024-01-12 MED ORDER — PHENOBARBITAL 64.8 MG PO TABS: 64.8000 mg | ORAL_TABLET | Freq: Two times a day (BID) | ORAL | 1 refills | Status: DC

## 2024-01-12 NOTE — Addendum Note (Signed)
 Addended by: Melvyn Novas on: 01/12/2024 03:48 PM   Modules accepted: Orders

## 2024-02-07 ENCOUNTER — Telehealth: Payer: Self-pay | Admitting: Neurology

## 2024-02-07 NOTE — Telephone Encounter (Signed)
 Patient called to cancel appointment due to transportation issues. Patient's mother has had surgery and unable to bring to appt.

## 2024-02-10 ENCOUNTER — Ambulatory Visit: Payer: Medicare (Managed Care) | Admitting: Neurology

## 2024-02-26 ENCOUNTER — Ambulatory Visit: Admission: EM | Admit: 2024-02-26 | Discharge: 2024-02-26 | Disposition: A | Discharge status: Home / Self Care

## 2024-02-26 DIAGNOSIS — R569 Unspecified convulsions: Secondary | ICD-10-CM | POA: Insufficient documentation

## 2024-02-26 DIAGNOSIS — N926 Irregular menstruation, unspecified: Secondary | ICD-10-CM | POA: Insufficient documentation

## 2024-02-26 DIAGNOSIS — R350 Frequency of micturition: Secondary | ICD-10-CM | POA: Insufficient documentation

## 2024-02-26 DIAGNOSIS — N898 Other specified noninflammatory disorders of vagina: Secondary | ICD-10-CM | POA: Insufficient documentation

## 2024-02-26 DIAGNOSIS — L819 Disorder of pigmentation, unspecified: Secondary | ICD-10-CM | POA: Insufficient documentation

## 2024-02-26 DIAGNOSIS — R300 Dysuria: Secondary | ICD-10-CM | POA: Insufficient documentation

## 2024-02-26 DIAGNOSIS — N946 Dysmenorrhea, unspecified: Secondary | ICD-10-CM | POA: Insufficient documentation

## 2024-02-26 DIAGNOSIS — K645 Perianal venous thrombosis: Secondary | ICD-10-CM | POA: Diagnosis not present

## 2024-02-26 DIAGNOSIS — N921 Excessive and frequent menstruation with irregular cycle: Secondary | ICD-10-CM | POA: Insufficient documentation

## 2024-02-26 DIAGNOSIS — G8918 Other acute postprocedural pain: Secondary | ICD-10-CM | POA: Insufficient documentation

## 2024-02-26 DIAGNOSIS — B009 Herpesviral infection, unspecified: Secondary | ICD-10-CM | POA: Insufficient documentation

## 2024-02-26 DIAGNOSIS — N83209 Unspecified ovarian cyst, unspecified side: Secondary | ICD-10-CM | POA: Insufficient documentation

## 2024-02-26 DIAGNOSIS — N943 Premenstrual tension syndrome: Secondary | ICD-10-CM | POA: Insufficient documentation

## 2024-02-26 DIAGNOSIS — N920 Excessive and frequent menstruation with regular cycle: Secondary | ICD-10-CM | POA: Insufficient documentation

## 2024-02-26 DIAGNOSIS — E7800 Pure hypercholesterolemia, unspecified: Secondary | ICD-10-CM | POA: Insufficient documentation

## 2024-02-26 MED ORDER — HYDROCORTISONE ACETATE 25 MG RE SUPP: 25.0000 mg | Freq: Two times a day (BID) | RECTAL | 0 refills | Status: AC

## 2024-02-26 MED ORDER — LIDOCAINE-PRILOCAINE 2.5-2.5 % EX CREA: 1.0000 | TOPICAL_CREAM | Freq: Four times a day (QID) | CUTANEOUS | 1 refills | Status: AC | PRN

## 2024-02-26 NOTE — ED Provider Notes (Signed)
 [name]    CSN: [id number] Arrival date & time: 02/26/24  1452      History   Chief Complaint Chief Complaint  Patient presents with   Pain (Rectum)   Hemorrhoids    HPI Teresa Lawrence is a 58 y.o. female.   58 year old female who presents to urgent care with complaints of rectal pain.  This has been ongoing for several months but has been somewhat worse in the last few weeks.  She denies any pain with bowel movement but reports pain with sitting.  She denies any blood per rectum.  She has had this problem in the past and was treated with Diflucan  which seemed to help it.  She was also given nifedipine  solution to apply but reports that did not help at all.  She thought she had a rectal fissure.  She has never seen a gastroenterologist.  She has never had a colonoscopy.  She has done Cologuard which was negative for any acute problems.     Past Medical History:  Diagnosis Date   Abnormal Pap smear    Anxiety    Bipolar disorder (HCC)    Dysmenorrhea    Dysplasia    Dysrhythmia    episodes of palpitations   Dysuria    H/O head injury    Headache(784.0)    High blood cholesterol    HSV-2 infection    Hx of hypercholesterolemia    Hx of ovarian cyst    Hx of seizure disorder    Mass of ovary    left side   Menorrhagia    Mental retardation    mild per Dr. notes in EPIC   Mild mental retardation    Perimenopausal vasomotor symptoms    PMS (premenstrual syndrome)    PMS (premenstrual syndrome)    PMS (premenstrual syndrome)    RLS (restless legs syndrome)    Seizures (HCC)    Uterine mass    Vaginal yeast infection 2007   recurrent    Yeast vaginitis     Patient Active Problem List   Diagnosis Date Noted   Irregular periods 02/26/2024   Irregular intermenstrual bleeding 02/26/2024   Increased frequency of urination 02/26/2024   Hypercholesterolemia 02/26/2024   Dysuria 02/26/2024   Dysmenorrhea 02/26/2024   Cyst of ovary 02/26/2024    Seizure (HCC) 02/26/2024   Postoperative pain 02/26/2024   Menorrhagia 02/26/2024   Vaginal discharge 02/26/2024   Herpes simplex type 2 infection 02/26/2024   Premenstrual tension syndrome 02/26/2024   Abnormal pigmentation of skin 02/26/2024   Facial tingling sensation 07/30/2022   Sensation of skin crawling 07/30/2022   Nonintractable epilepsy without status epilepticus (HCC) 06/16/2021   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   RLS (restless legs syndrome)    Acute cystitis with hematuria 05/09/2020   Nondisplaced fracture of lateral malleolus of left fibula, initial encounter for closed fracture 10/25/2017   Morbid obesity (HCC) 07/17/2014   Insomnia 03/17/2013   Headache 02/23/2013   Hx of yeast infection 04/20/2012   Hx of menorrhagia 04/20/2012   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   Bipolar disorder (HCC) 04/20/2012   Hx of menorrhagia 04/20/2012   Hx of ovarian cyst 04/20/2012   Epilepsy (HCC) 04/20/2012   Hx of seizure disorder 04/20/2012   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   Hx of dysmenorrhea 04/20/2012   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   Hx of hypercholesterolemia 04/20/2012   Hx of PMS (premenstrual syndrome) 04/20/2012   Encounter for therapeutic  drug monitoring 05/01/2011    Past Surgical History:  Procedure Laterality Date   ABDOMINAL HYSTERECTOMY  2013   BREAST BIOPSY Right 11/18/2023   MM RT BREAST BX W LOC DEV 1ST LESION IMAGE BX SPEC STEREO GUIDE 11/18/2023 [facility]   COLPOSCOPY     CYSTOSCOPY Bilateral 05/30/2013   Procedure: CYSTOSCOPY;  Surgeon: Norville Beery, MD;  Location: [facility];  Service: Gynecology;  Laterality: Bilateral;   DILATION AND CURETTAGE OF UTERUS     ENDOMETRIAL BIOPSY     HYSTEROSCOPY W/ ENDOMETRIAL ABLATION     LAPAROSCOPIC HYSTERECTOMY Right 05/30/2013   Procedure: HYSTERECTOMY TOTAL LAPAROSCOPIC;  Surgeon: Norville Beery, MD;  Location: [facility];  Service: Gynecology;  Laterality: Right;  Right  Salpingectomy   POLYPECTOMY     WISDOM TOOTH EXTRACTION      OB History   No obstetric history on file.      Home Medications    Prior to Admission medications   Medication Sig Start Date End Date Taking? Authorizing Provider  furosemide  (LASIX ) 20 MG tablet Take 20 mg by mouth every morning. 09/18/23  Yes [provider]  furosemide  (LASIX ) 40 MG tablet Take 40 mg by mouth every morning. 09/25/23  Yes [provider]  hydrocortisone (ANUSOL-HC) 25 MG suppository Place 1 suppository (25 mg total) rectally 2 (two) times daily for 7 days. 02/26/24 03/04/24 Yes Aking Klabunde A, PA-C  lidocaine -prilocaine (EMLA) cream Apply 1 Application topically 4 (four) times daily as needed (Rectal pain). 02/26/24  Yes Anna Beaird A, PA-C  Na Sulfate-K Sulfate-Mg Sulfate concentrate (SUPREP) 17.5-3.13-1.6 GM/177ML SOLN Take 1 kit by mouth once. 10/19/23  Yes [provider]  acyclovir  (ZOVIRAX ) 200 MG capsule Take 200 mg by mouth 3 (three) times daily as needed (fever blisters).     [provider]  atorvastatin  (LIPITOR) 40 MG tablet Take 1 tablet by mouth once daily 05/14/23   Saguier, Gaylin Ke, PA-C  calcium -vitamin D  (OSCAL WITH D) 500-5 MG-MCG tablet Take 1 tablet by mouth daily with breakfast. 07/30/22   Dohmeier, Raoul Byes, MD  fluconazole  (DIFLUCAN ) 150 MG tablet Take one tab PO today and repeat dose in 3 days if symptoms persist 07/24/23   Vernestine Gondola, PA-C  gabapentin  (NEURONTIN ) 100 MG capsule TAKE 1 CAPSULE BY MOUTH THREE TIMES DAILY 10/14/23   Sater, Sherida Dimmer, MD  hydrocortisone cream 1 % Apply 1 application topically as needed (itching).    [provider]  ibuprofen  (ADVIL ,MOTRIN ) 600 MG tablet Take 1 tablet (600 mg total) by mouth every 6 (six) hours as needed (mild pain). 05/31/13   Ivin Marrow, PA-C  NIFEdipine  POWD Please compound into 0.2% ointment to be applied to anal area tid. 01/06/23   Afton Albright, MD  PHENobarbital  (LUMINAL) 64.8  MG tablet Take 1 tablet (64.8 mg total) by mouth 2 (two) times daily. 01/12/24   Dohmeier, Raoul Byes, MD  pramipexole  (MIRAPEX ) 0.25 MG tablet TAKE 1 TABLET BY MOUTH IN THE MORNING AND 1 IN THE EVENING AND 2 AT NIGHT 01/10/24   Wess Hammed, NP  VRAYLAR 4.5 MG CAPS Take 1 capsule by mouth at bedtime. 11/09/20   [provider]    Family History Family History  Problem Relation Age of Onset   Heart attack Father    Breast cancer Sister     Social History Social History   Tobacco Use   Smoking status: Former    Types: Cigarettes   Smokeless tobacco: Never   Tobacco comments:  Quit 10 years ago.  Vaping Use   Vaping status: Never Used  Substance Use Topics   Alcohol use: No    Alcohol/week: 0.0 standard drinks of alcohol   Drug use: No     Allergies   Codeine, Promethazine , Requip [ropinirole hcl], Ropinirole, Topamax  [topiramate ], and Zonegran  [zonisamide ]   Review of Systems Review of Systems  Constitutional:  Negative for chills and fever.  HENT:  Negative for ear pain and sore throat.   Eyes:  Negative for pain and visual disturbance.  Respiratory:  Negative for cough and shortness of breath.   Cardiovascular:  Negative for chest pain and palpitations.  Gastrointestinal:  Positive for rectal pain. Negative for abdominal pain and vomiting.  Genitourinary:  Negative for dysuria and hematuria.  Musculoskeletal:  Negative for arthralgias and back pain.  Skin:  Negative for color change and rash.  Neurological:  Negative for seizures and syncope.  All other systems reviewed and are negative.    Physical Exam Triage Vital Signs ED Triage Vitals  Encounter Vitals Group     BP 02/26/24 1519 109/75     Systolic BP Percentile --      Diastolic BP Percentile --      Pulse Rate 02/26/24 1519 89     Resp 02/26/24 1519 18     Temp 02/26/24 1519 97.9 F (36.6 C)     Temp Source 02/26/24 1519 Oral     SpO2 02/26/24 1519 96 %     Weight 02/26/24 1516 229 lb 15 oz  (104.3 kg)     Height 02/26/24 1516 5\' 3"  (1.6 m)     Head Circumference --      Peak Flow --      Pain Score 02/26/24 1513 10     Pain Loc --      Pain Education --      Exclude from Growth Chart --    No data found.  Updated Vital Signs BP 109/75 (BP Location: Left Arm)   Pulse 89   Temp 97.9 F (36.6 C) (Oral)   Resp 18   Ht 5\' 3"  (1.6 m)   Wt 229 lb 15 oz (104.3 kg)   LMP 05/11/2013 (LMP Unknown)   SpO2 96%   BMI 40.73 kg/m   Visual Acuity Right Eye Distance:   Left Eye Distance:   Bilateral Distance:    Right Eye Near:   Left Eye Near:    Bilateral Near:     Physical Exam Vitals and nursing note reviewed.  Constitutional:      General: She is not in acute distress.    Appearance: She is well-developed.  HENT:     Head: Normocephalic and atraumatic.  Eyes:     Conjunctiva/sclera: Conjunctivae normal.  Cardiovascular:     Rate and Rhythm: Normal rate.     Heart sounds: No murmur heard. Pulmonary:     Effort: Pulmonary effort is normal. No respiratory distress.  Abdominal:     Palpations: Abdomen is soft.     Tenderness: There is no abdominal tenderness.  Genitourinary:   Musculoskeletal:        General: No swelling.     Cervical back: Neck supple.  Skin:    General: Skin is warm and dry.     Capillary Refill: Capillary refill takes less than 2 seconds.  Neurological:     Mental Status: She is alert.  Psychiatric:        Mood and Affect: Mood normal.  UC Treatments / Results  Labs (all labs ordered are listed, but only abnormal results are displayed) Labs Reviewed - No data to display  EKG   Radiology No results found.  Procedures Procedures (including critical care time)  Medications Ordered in UC Medications - No data to display  Initial Impression / Assessment and Plan / UC Course  I have reviewed the triage vital signs and the nursing notes.  Pertinent labs & imaging results that were available during my care of the  patient were reviewed by me and considered in my medical decision making (see chart for details).     Thrombosed hemorrhoids   Physical exam shows a small thrombosed internal hemorrhoid that is just visible at the anal verge.  This does not require a surgical procedure here in the office but does need follow-up with a gastroenterologist.  We will treat this with anti-inflammatories and lidocaine  cream to help with pain.  We will treat with the following: Hydrocortisone suppositories twice daily for 7 days.  May repeat for another 7 days if symptoms are persistent Lidocaine  cream 4 times daily as needed for rectal pain Keep your bowels soft as hard stools and constipation can worsen hemorrhoid symptoms Call gastroenterology to schedule an appointment to discuss hemorrhoids given the duration of time that these have been present Return to urgent care or PCP if symptoms worsen or fail to resolve.    Final Clinical Impressions(s) / UC Diagnoses   Final diagnoses:  Thrombosed hemorrhoids   Discharge Instructions      Physical exam shows a small thrombosed internal hemorrhoid that is just visible at the anal verge.  This does not require a surgical procedure here in the office but does need follow-up with a gastroenterologist.  We will treat this with anti-inflammatories and lidocaine  cream to help with pain.  We will treat with the following: Hydrocortisone suppositories twice daily for 7 days.  May repeat for another 7 days if symptoms are persistent Lidocaine  cream 4 times daily as needed for rectal pain Keep your bowels soft as hard stools and constipation can worsen hemorrhoid symptoms Call gastroenterology to schedule an appointment to discuss hemorrhoids given the duration of time that these have been present Return to urgent care or PCP if symptoms worsen or fail to resolve.    ED Prescriptions     Medication Sig Dispense Auth. Provider   hydrocortisone (ANUSOL-HC) 25 MG suppository  Place 1 suppository (25 mg total) rectally 2 (two) times daily for 7 days. 14 suppository Ceira Hoeschen A, PA-C   lidocaine -prilocaine (EMLA) cream Apply 1 Application topically 4 (four) times daily as needed (Rectal pain). 30 g Kreg Pesa, [place]      PDMP not reviewed this encounter.   Kreg Pesa, [place] 02/26/24 1630

## 2024-02-26 NOTE — ED Triage Notes (Signed)
"  I have a possible anal fissure or hemorrhoid flare up causing pain". "This started months ago but hurts really bad right now".

## 2024-02-26 NOTE — Discharge Instructions (Addendum)
 Physical exam shows a small thrombosed internal hemorrhoid that is just visible at the anal verge.  This does not require a surgical procedure here in the office but does need follow-up with a gastroenterologist.  We will treat this with anti-inflammatories and lidocaine  cream to help with pain.  We will treat with the following: Hydrocortisone suppositories twice daily for 7 days.  May repeat for another 7 days if symptoms are persistent Lidocaine  cream 4 times daily as needed for rectal pain Keep your bowels soft as hard stools and constipation can worsen hemorrhoid symptoms Call gastroenterology to schedule an appointment to discuss hemorrhoids given the duration of time that these have been present Return to urgent care or PCP if symptoms worsen or fail to resolve.

## 2024-04-07 ENCOUNTER — Encounter: Payer: Self-pay | Admitting: Podiatry

## 2024-04-07 ENCOUNTER — Ambulatory Visit: Admitting: Podiatry

## 2024-04-07 DIAGNOSIS — M79671 Pain in right foot: Secondary | ICD-10-CM

## 2024-04-07 DIAGNOSIS — B351 Tinea unguium: Secondary | ICD-10-CM

## 2024-04-07 DIAGNOSIS — M79672 Pain in left foot: Secondary | ICD-10-CM

## 2024-04-07 NOTE — Progress Notes (Signed)
 Patient presents for evaluation and treatment of tenderness and some redness around nails feet.  Tenderness around toes with walking and wearing shoes.  She is 6 months initiation of Lamisil treatment for 3 months.  Has been doing well notices the nail seem to be clearing and the fungal changes well.  Nails are still causing her some discomfort however  Physical exam:  General appearance: Alert, pleasant, and in no acute distress.  Vascular: Pedal pulses: DP 2/4 B/L, PT 2/4 B/L.  Moderate edema lower legs bilaterally  Neurological:    Dermatologic:  Nails thickened, disfigured, discolored 1-5 BL with subungual debris.  Redness and hypertrophic nail folds along nail folds bilaterally but no signs of drainage or infection.  Musculoskeletal:     Diagnosis: 1. Painful onychomycotic nails 1 through 5 bilaterally. 2. Pain toes 1 through 5 bilaterally.  Plan: -Debrided onychomycotic nails 1 through 5 bilaterally.   Return 3 months

## 2024-05-25 ENCOUNTER — Telehealth: Payer: Self-pay | Admitting: Neurology

## 2024-05-30 ENCOUNTER — Other Ambulatory Visit: Payer: Self-pay | Admitting: Neurology

## 2024-05-30 NOTE — Telephone Encounter (Signed)
 Sent to sarah in refill que to sign

## 2024-05-30 NOTE — Telephone Encounter (Signed)
 Pt states that Pharmacy called and informed her that  Pt medication refill was denied by Md that she shouls call to follow up  pramipexole  (MIRAPEX ) 0.25 MG tablet   .  [facility] [address] ([address]), [place] - [address] (Ph: [phone])

## 2024-07-10 ENCOUNTER — Ambulatory Visit: Admitting: Neurology

## 2024-07-21 ENCOUNTER — Other Ambulatory Visit: Payer: Self-pay | Admitting: Neurology

## 2024-07-21 NOTE — Telephone Encounter (Signed)
 Pt called to request medication refill  PHENobarbital  (LUMINAL) 64.8 MG tablet   Pt would like mediation sent to    [id number] - [place] ([place]), [place] - [address] (Ph: [phone])

## 2024-07-21 NOTE — Telephone Encounter (Signed)
 Last seen 02/04/23 Next appt 11/29/24 Dispenses   Dispensed Days Supply Quantity Provider Pharmacy  PHENOBARBITAL  64.8MG  TAB 04/11/2024 90 180 each Dohmeier, Dedra, MD [facility] [phone] ...  PHENOBARBITAL  64.8MG  TAB 01/12/2024 90 180 each Dohmeier, Dedra, MD [facility] [phone] ...  PHENOBARBITAL  64.8MG  TAB 09/15/2023 90 180 each Dohmeier, Dedra, MD [facility] [phone] .SABRASABRA

## 2024-07-24 NOTE — Telephone Encounter (Signed)
 Patient called after-hours call center for a prescription refill on phenobarbital , she has about 4 or 5 pills left she states.  She has not been seen in this office in over 1 year.  She was reminded to call our office to see if an opening is available.  She is currently scheduled in February 2026 with Sarah.  I advised her that I will send a 1 month prescription to her pharmacy on file.  She demonstrated understanding and agreement and will call our office during normal office hours to see if an opening has come available.  She reports that she is on the wait list for appointments.

## 2024-07-24 NOTE — Telephone Encounter (Signed)
 Pt called to follow up about Medication refill PHENobarbital  (LUMINAL) 64.8 MG tablet    Pt Sates she is almost out of medication   Medication is to be sent to   [facility] - [place] ([place]), [place] - [address] (Ph: [phone])

## 2024-08-22 ENCOUNTER — Ambulatory Visit: Payer: Medicare (Managed Care) | Admitting: Podiatry

## 2024-08-25 ENCOUNTER — Other Ambulatory Visit: Payer: Self-pay | Admitting: Neurology

## 2024-09-04 ENCOUNTER — Ambulatory Visit: Admitting: Podiatry

## 2024-11-07 ENCOUNTER — Other Ambulatory Visit: Payer: Self-pay | Admitting: Neurology

## 2024-11-07 NOTE — Telephone Encounter (Signed)
 Pt called to request medication refill PHENobarbital  (LUMINAL) 64.8 MG tablet   Pt medications is to be sent to  [facility] - [place] ([place]), [place] - [address] Phone: [phone]  Fax: [phone]

## 2024-11-08 ENCOUNTER — Telehealth: Payer: Self-pay | Admitting: Neurology

## 2024-11-08 NOTE — Telephone Encounter (Signed)
 Patient called to verify appointment

## 2024-11-29 ENCOUNTER — Ambulatory Visit: Admitting: Neurology
